# Patient Record
Sex: Male | Born: 1940 | Race: White | Hispanic: No | Marital: Married | Smoking: Former smoker
Health system: Southern US, Community
[De-identification: ages and names within clinical notes are randomized; demographics above are authoritative.]

## PROBLEM LIST (undated history)

## (undated) DIAGNOSIS — N200 Calculus of kidney: Secondary | ICD-10-CM

## (undated) DIAGNOSIS — R7881 Bacteremia: Secondary | ICD-10-CM

## (undated) DIAGNOSIS — J449 Chronic obstructive pulmonary disease, unspecified: Secondary | ICD-10-CM

## (undated) DIAGNOSIS — H269 Unspecified cataract: Secondary | ICD-10-CM

## (undated) DIAGNOSIS — F329 Major depressive disorder, single episode, unspecified: Secondary | ICD-10-CM

## (undated) DIAGNOSIS — F32A Depression, unspecified: Secondary | ICD-10-CM

## (undated) DIAGNOSIS — M359 Systemic involvement of connective tissue, unspecified: Secondary | ICD-10-CM

## (undated) DIAGNOSIS — C801 Malignant (primary) neoplasm, unspecified: Secondary | ICD-10-CM

## (undated) DIAGNOSIS — I2699 Other pulmonary embolism without acute cor pulmonale: Secondary | ICD-10-CM

## (undated) DIAGNOSIS — Z87442 Personal history of urinary calculi: Secondary | ICD-10-CM

## (undated) DIAGNOSIS — C3491 Malignant neoplasm of unspecified part of right bronchus or lung: Secondary | ICD-10-CM

## (undated) DIAGNOSIS — I4891 Unspecified atrial fibrillation: Secondary | ICD-10-CM

## (undated) HISTORY — PX: CYSTOSCOPY W/ URETEROSCOPY W/ LITHOTRIPSY: SUR380

## (undated) HISTORY — DX: Malignant neoplasm of unspecified part of right bronchus or lung: C34.91

## (undated) HISTORY — DX: Calculus of kidney: N20.0

## (undated) HISTORY — DX: Unspecified cataract: H26.9

## (undated) HISTORY — PX: TONSILLECTOMY: SUR1361

---

## 2012-04-14 DIAGNOSIS — N200 Calculus of kidney: Secondary | ICD-10-CM

## 2012-04-14 HISTORY — DX: Calculus of kidney: N20.0

## 2016-05-06 ENCOUNTER — Other Ambulatory Visit: Payer: Self-pay | Admitting: Rheumatology

## 2016-05-06 DIAGNOSIS — M25512 Pain in left shoulder: Principal | ICD-10-CM

## 2016-05-06 DIAGNOSIS — G8929 Other chronic pain: Secondary | ICD-10-CM

## 2016-05-07 ENCOUNTER — Other Ambulatory Visit: Payer: Self-pay | Admitting: Rheumatology

## 2016-05-07 DIAGNOSIS — R918 Other nonspecific abnormal finding of lung field: Secondary | ICD-10-CM

## 2016-05-08 ENCOUNTER — Ambulatory Visit
Admission: RE | Admit: 2016-05-08 | Discharge: 2016-05-08 | Disposition: A | Discharge status: Home / Self Care | Payer: Medicare Other | Source: Ambulatory Visit | Attending: Rheumatology | Admitting: Rheumatology

## 2016-05-08 DIAGNOSIS — R918 Other nonspecific abnormal finding of lung field: Secondary | ICD-10-CM | POA: Insufficient documentation

## 2016-05-08 DIAGNOSIS — R590 Localized enlarged lymph nodes: Secondary | ICD-10-CM | POA: Diagnosis not present

## 2016-05-08 DIAGNOSIS — I2510 Atherosclerotic heart disease of native coronary artery without angina pectoris: Secondary | ICD-10-CM | POA: Insufficient documentation

## 2016-05-08 DIAGNOSIS — I700 Atherosclerosis of aorta: Secondary | ICD-10-CM | POA: Diagnosis not present

## 2016-05-08 HISTORY — DX: Systemic involvement of connective tissue, unspecified: M35.9

## 2016-05-08 MED ORDER — IOPAMIDOL (ISOVUE-300) INJECTION 61%
75.0000 mL | Freq: Once | INTRAVENOUS | Status: AC | PRN
  Administered 2016-05-08: 75 mL via INTRAVENOUS

## 2016-05-09 ENCOUNTER — Other Ambulatory Visit: Payer: Self-pay

## 2016-05-14 DIAGNOSIS — C3491 Malignant neoplasm of unspecified part of right bronchus or lung: Secondary | ICD-10-CM | POA: Insufficient documentation

## 2016-05-14 HISTORY — DX: Malignant neoplasm of unspecified part of right bronchus or lung: C34.91

## 2016-05-14 NOTE — Progress Notes (Signed)
[place]  Telephone:(336) [phone] Fax:(336) [phone]  ID: GREYSIN MEDLEN OB: 02/12/1941  MR#: [id number]  BDZ#:[id number]  [name], MD as PCP - General (Family Medicine)  CHIEF COMPLAINT: Mass of upper lobe of left lung.  INTERVAL HISTORY: Patient is a 76 year old male who was noted to have persistent left shoulder pain and also had a 15-20 pound weight loss over the past several months. He otherwise has felt well. He has had no neurologic complaints. He denies any recent fevers. He denies any pain. He has no chest pain, shortness of breath, cough, or hemoptysis. He denies any nausea, vomiting, constipation, or diarrhea. He has no urinary complaints. Patient otherwise feels well and offers no further specific complaints.  REVIEW OF SYSTEMS:   Review of Systems  Constitutional: Positive for weight loss. Negative for fever and malaise/fatigue.  Respiratory: Negative.  Negative for cough, hemoptysis and shortness of breath.   Cardiovascular: Negative.  Negative for chest pain and leg swelling.  Gastrointestinal: Negative.  Negative for abdominal pain.  Genitourinary: Positive for frequency.  Musculoskeletal: Positive for joint pain.  Neurological: Negative.  Negative for sensory change and weakness.  Psychiatric/Behavioral: Negative.  The patient is not nervous/anxious.     As per HPI. Otherwise, a complete review of systems is negative.  PAST MEDICAL HISTORY: Past Medical History:  Diagnosis Date  . Cataract   . Collagen vascular disease ([place])   . Kidney stones 2014    PAST SURGICAL HISTORY: History reviewed. No pertinent surgical history.  FAMILY HISTORY: Family History  Problem Relation Age of Onset  . Heart disease Mother   . Heart disease Father   . Cancer Sister 6    luekemia    ADVANCED DIRECTIVES (Y/N):  N  HEALTH MAINTENANCE: Social History  Substance Use Topics  . Smoking status: Current Every Day Smoker   Packs/day: 2.00    Years: 55.00  . Smokeless tobacco: Never Used  . Alcohol use No     Comment: Not lately     Colonoscopy:  PAP:  Bone density:  Lipid panel:  No Known Allergies  Current Outpatient Prescriptions  Medication Sig Dispense Refill  . folic acid (FOLVITE) 1 MG tablet Take by mouth.    . methotrexate (RHEUMATREX) 2.5 MG tablet Take 20 mg by mouth.    . Naproxen Sodium 220 MG CAPS Take by mouth.    . tamsulosin (FLOMAX) 0.4 MG CAPS capsule Take 0.4 mg by mouth.    Marland Kitchen ibuprofen (ADVIL,MOTRIN) 200 MG tablet Take 200 mg by mouth.     No current facility-administered medications for this visit.     OBJECTIVE: Vitals:   05/15/16 1358  BP: 124/71  Pulse: 68  Temp: 98 F (36.7 C)     Body mass index is 25.15 kg/m.    ECOG FS:0 - Asymptomatic  General: Well-developed, well-nourished, no acute distress. Eyes: Pink conjunctiva, anicteric sclera. HEENT: Normocephalic, moist mucous membranes, clear oropharnyx. Lungs: Clear to auscultation bilaterally. Heart: Regular rate and rhythm. No rubs, murmurs, or gallops. Abdomen: Soft, nontender, nondistended. No organomegaly noted, normoactive bowel sounds. Musculoskeletal: No edema, cyanosis, or clubbing. Neuro: Alert, answering all questions appropriately. Cranial nerves grossly intact. Skin: No rashes or petechiae noted. Psych: Normal affect. Lymphatics: No cervical, calvicular, axillary or inguinal LAD.   LAB RESULTS:  No results found for: NA, K, CL, CO2, GLUCOSE, BUN, CREATININE, CALCIUM, PROT, ALBUMIN, AST, ALT, ALKPHOS, BILITOT, GFRNONAA, GFRAA  No results found for: WBC, NEUTROABS, HGB, HCT,  MCV, PLT   STUDIES: Ct Chest W Contrast  Result Date: 05/08/2016 CLINICAL DATA:  Lung mass. EXAM: CT CHEST WITH CONTRAST TECHNIQUE: Multidetector CT imaging of the chest was performed during intravenous contrast administration. CONTRAST:  56m ISOVUE-300 IOPAMIDOL (ISOVUE-300) INJECTION 61% COMPARISON:  Radiograph of  March 31, 2013. FINDINGS: Cardiovascular: Atherosclerosis of thoracic aorta is noted without aneurysm or dissection. Coronary artery calcifications are noted. Mediastinum/Nodes: 1.5 cm right peritracheal lymph node is noted. 1.8 cm right hilar lymph node is noted. Lungs/Pleura: 6.3 x 6.1 x 5.3 cm mass noted in left upper lobe consistent with malignancy. Just superior to it, 2.0 x 1.9 cm satellite lesion is noted. No pneumothorax or pleural effusion is noted. Right lung is clear. Upper Abdomen: No acute abnormality. Musculoskeletal: No chest wall abnormality. No acute or significant osseous findings. IMPRESSION: Aortic atherosclerosis. Coronary artery calcifications are noted suggesting coronary disease. 6.3 cm left upper lobe mass is noted consistent with malignancy. 2 cm satellite lesion is seen near it. Right peritracheal and hilar adenopathy is noted concerning for metastatic disease. These results will be called to the ordering clinician or representative by the Radiologist Assistant, and communication documented in the PACS or zVision Dashboard. Electronically Signed   By: JMarijo Conception M.D.   On: 05/08/2016 10:57    ASSESSMENT: Mass of upper lobe of left lung.  PLAN:    1. Mass of upper lobe of left lung: CT scan results reviewed independently and reported as above highly suspicious for underlying malignancy. Patient's case was also discussed at cancer conference with radiology and urology and it was agreed upon to pursue PET scan followed by pulmonary referral for bronchoscopic evaluation. Also, patient satellite lesion appears to be unchanged for several years based on chest x-rays and may be benign. Patient may require a second biopsy if necessary. Patient will return to clinic one week after his biopsy to discuss the results and treatment planning. Next  Approximately 45 minutes was spent in discussion of which greater than 50% was consultation.  Patient expressed understanding and was in  agreement with this plan. He also understands that He can call clinic at any time with any questions, concerns, or complaints.   Cancer Staging No matching staging information was found for the patient.  TLloyd Huger MD   05/18/2016 8:57 AM

## 2016-05-15 ENCOUNTER — Encounter: Payer: Self-pay | Admitting: Oncology

## 2016-05-15 ENCOUNTER — Ambulatory Visit: Payer: Self-pay

## 2016-05-15 ENCOUNTER — Inpatient Hospital Stay: Payer: Medicare Other | Attending: Oncology | Admitting: Oncology

## 2016-05-15 ENCOUNTER — Telehealth: Payer: Self-pay | Admitting: Pulmonary Disease

## 2016-05-15 DIAGNOSIS — Z806 Family history of leukemia: Secondary | ICD-10-CM | POA: Insufficient documentation

## 2016-05-15 DIAGNOSIS — Z79899 Other long term (current) drug therapy: Secondary | ICD-10-CM | POA: Diagnosis not present

## 2016-05-15 DIAGNOSIS — I700 Atherosclerosis of aorta: Secondary | ICD-10-CM | POA: Insufficient documentation

## 2016-05-15 DIAGNOSIS — R918 Other nonspecific abnormal finding of lung field: Secondary | ICD-10-CM

## 2016-05-15 DIAGNOSIS — I2510 Atherosclerotic heart disease of native coronary artery without angina pectoris: Secondary | ICD-10-CM | POA: Diagnosis not present

## 2016-05-15 DIAGNOSIS — F17210 Nicotine dependence, cigarettes, uncomplicated: Secondary | ICD-10-CM | POA: Diagnosis not present

## 2016-05-15 DIAGNOSIS — R634 Abnormal weight loss: Secondary | ICD-10-CM

## 2016-05-15 DIAGNOSIS — I998 Other disorder of circulatory system: Secondary | ICD-10-CM | POA: Diagnosis not present

## 2016-05-15 DIAGNOSIS — R590 Localized enlarged lymph nodes: Secondary | ICD-10-CM

## 2016-05-15 DIAGNOSIS — Z87442 Personal history of urinary calculi: Secondary | ICD-10-CM | POA: Diagnosis not present

## 2016-05-15 DIAGNOSIS — M25512 Pain in left shoulder: Secondary | ICD-10-CM | POA: Insufficient documentation

## 2016-05-15 NOTE — Telephone Encounter (Signed)
Please call patient as the [facility] w/ Dr. Grayland Ormond wants this patient in STAT?

## 2016-05-15 NOTE — Telephone Encounter (Signed)
Per DR set up a time next week on Tuesday or Thursday to be seen by him.

## 2016-05-15 NOTE — Progress Notes (Signed)
Patient here for initial visit. 55 pounds in the last year. No complaints of breathing, sleeping WNL, Urinary frequency at night, bowels normal.

## 2016-05-15 NOTE — Telephone Encounter (Signed)
Please look at CT on this pt and see how soon I need to schedule. Dr. Grayland Ormond wants pt in ASAP. Thanks

## 2016-05-16 NOTE — Telephone Encounter (Signed)
Received message from pt asking to call him back. Tried calling but no answer. LMOVM

## 2016-05-16 NOTE — Telephone Encounter (Signed)
LMOVM for pt to call me back to schedule appt.

## 2016-05-19 NOTE — Telephone Encounter (Signed)
Pt informed of appt. Nothing further needed.

## 2016-05-20 ENCOUNTER — Encounter
Admission: RE | Admit: 2016-05-20 | Discharge: 2016-05-20 | Disposition: A | Discharge status: Home / Self Care | Payer: Medicare Other | Source: Ambulatory Visit | Attending: Oncology | Admitting: Oncology

## 2016-05-20 DIAGNOSIS — R918 Other nonspecific abnormal finding of lung field: Secondary | ICD-10-CM | POA: Insufficient documentation

## 2016-05-20 LAB — GLUCOSE, CAPILLARY: Glucose-Capillary: 97 mg/dL (ref 65–99)

## 2016-05-20 MED ORDER — FLUDEOXYGLUCOSE F - 18 (FDG) INJECTION
12.7400 | Freq: Once | INTRAVENOUS | Status: AC | PRN
  Administered 2016-05-20: 12.74 via INTRAVENOUS

## 2016-05-21 NOTE — Progress Notes (Signed)
[facility] Consultation      Assessment and Plan:  Left lung mass.  --Appears accessible via LUL or lingular bronchus.  --Discussed risks and benefits of procedure, will proceed with EBUS.   Mediastinal and hilar lymphadenopathy.  --right sided lymphadenopathy, likely metastatic.   Nicotine abuse.  --Spent > 3 min in smoking cessation discussion.   Date: 05/21/2016  MRN# [id number] Frank Cowan May 24, 1940  Referring Physician:   KALDEN Cowan is a 76 y.o. old male seen in consultation for chief complaint of:    Chief Complaint  Patient presents with  . Advice Only    per Finnegan: cough w/yellow/brown mucus at times: no other symptoms    HPI:   Patient is a 76 year old male  Smoker who was noted to have persistent left shoulder pain and also had a 15-20 pound weight loss over the past several months. He had a CXR which noted a stable LUL nodule.  Review of Ct chest from 05/09/15 and PET scan from 05/21/15 show a 6 cm LUL mass with SUV of 21.3 which appears accessible from the bronchus, with adjacent 2 cm non PET avid lesion, which may be a satellite lesion or older benign.  There are also pre tracheal and right hilar lymph nodes with SUV of about 15.  He notes that his breathing is ok, he lives with his wife, he drives a car. 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 He has never been diagnosed with COPD/emphysema, but he did have a diagnosis of asthma. He does not have symptoms anymore, he thinks he was exposed to asbestos when he worked in [employer] fund[employer].   He is smoking about half ppd, but thinks he can quit.   He was ambulated on RA in office today, minimal dyspnea at good pace.   PMHX:   Past Medical History:  Diagnosis Date  . Cataract   . Collagen vascular disease ([place])   . Kidney stones 2014   Surgical Hx:  No past surgical history on file. Family Hx:  Family History  Problem Relation Age of Onset  . Heart  disease Mother   . Heart disease Father   . Cancer Sister 103    luekemia   Social Hx:   Social History  Substance Use Topics  . Smoking status: Current Every Day Smoker    Packs/day: 2.00    Years: 55.00  . Smokeless tobacco: Never Used  . Alcohol use No     Comment: Not lately   Medication:       Allergies:  Patient has no known allergies.  Review of Systems: Gen:  Denies  fever, sweats, chills HEENT: Denies blurred vision, double vision. bleeds, sore throat Cvc:  No dizziness, chest pain. Resp:   Denies cough or sputum production, shortness of breath Gi: Denies swallowing difficulty, stomach pain. Gu:  Denies bladder incontinence, burning urine Ext:   No Joint pain, stiffness. Skin: No skin rash,  hives  Endoc:  No polyuria, polydipsia. Psych: No depression, insomnia. Other:  All other systems were reviewed with the patient and were negative other that what is mentioned in the HPI.   Physical Examination:   VS: BP 110/68 (BP Location: Right Arm, Cuff Size: Normal)   Pulse 71   Wt 184 lb (83.5 kg)   SpO2 99%   BMI 24.95 kg/m   General Appearance: No distress  Neuro:without focal findings,  speech normal,  HEENT: PERRLA, EOM intact.  Pulmonary: normal breath sounds, No wheezing.  CardiovascularNormal S1,S2.  No m/r/g.   Abdomen: Benign, Soft, non-tender. Renal:  No costovertebral tenderness  GU:  No performed at this time. Endoc: No evident thyromegaly, no signs of acromegaly. Skin:   warm, no rashes, no ecchymosis  Extremities: normal, no cyanosis, clubbing.  Other findings:    LABORATORY PANEL:   CBC No results for input(s): WBC, HGB, HCT, PLT in the last 168 hours. ------------------------------------------------------------------------------------------------------------------  Chemistries  No results for input(s): NA, K, CL, CO2, GLUCOSE, BUN, CREATININE, CALCIUM, MG, AST, ALT, ALKPHOS, BILITOT in the last 168 hours.  Invalid input(s):  GFRCGP ------------------------------------------------------------------------------------------------------------------  Cardiac Enzymes No results for input(s): TROPONINI in the last 168 hours. ------------------------------------------------------------  RADIOLOGY:  Nm Pet Image Initial (pi) Skull Base To Thigh  Result Date: 05/20/2016 CLINICAL DATA:  Initial treatment strategy for left upper lobe mass. EXAM: NUCLEAR MEDICINE PET SKULL BASE TO THIGH TECHNIQUE: 12.7 mCi F-18 FDG was injected intravenously. Full-ring PET imaging was performed from the skull base to thigh after the radiotracer. CT data was obtained and used for attenuation correction and anatomic localization. FASTING BLOOD GLUCOSE:  Value: 97 mg/dl COMPARISON:  Chest CT 05/08/2016 FINDINGS: NECK No hypermetabolic lymph nodes in the neck. Mild glottic activity is likely physiologic. CHEST Dominant centrally necrotic 6.7 by 6.3 cm left upper lobe mass on image 90/3, maximum SUV 21.3. Pathologic been hypermetabolic right paratracheal, AP window, and right hilar adenopathy. Right lower paratracheal node 1.4 cm in short axis on image 86/3, maximum SUV 14.9. Right hilar node 2.0 cm in short axis on image 94/3, maximum SUV 14.5. The left upper lobe nodule with the thick calcification posteriorly is not hypermetabolic, and measures about 2.5 by 1.8 cm on image 74/3. Bilateral airway thickening. Coronary, aortic arch, and branch vessel atherosclerotic vascular disease. ABDOMEN/PELVIS Photopenic left-sided renal cysts. Sigmoid diverticulosis. Aortoiliac atherosclerotic vascular disease. Levoconvex rotary lumbar scoliosis. No findings of metastatic disease to the abdomen or pelvis. Mildly prominent prostate gland. SKELETON No focal hypermetabolic activity to suggest skeletal metastasis. IMPRESSION: 1. Highly hypermetabolic dominant 6.7 cm left upper lobe mass with hypermetabolic right hilar, right paratracheal, and AP window adenopathy. 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 2. The smaller left upper lobe nodule with posterior calcification seems benign, and is not hypermetabolic and could be a pulmonary hamartoma. 3. Other imaging findings of potential clinical significance: Coronary, aortic arch, and branch vessel atherosclerotic vascular disease. Aortoiliac atherosclerotic vascular disease. Airway thickening is present, suggesting bronchitis or reactive airways disease. Thoracolumbar scoliosis. Mildly prominent prostate gland. Electronically Signed   By: Van Clines M.D.   On: 05/20/2016 12:35       Thank  you for the consultation and for allowing [facility] to assist in the care of your patient. Our recommendations are noted above.  Please contact us if we can be of further service.   Marda Stalker, MD.  Board Certified in Internal Medicine, Pulmonary Medicine, [place], and Sleep Medicine.  [facility] Office Number: [phone]  Patricia Pesa, M.D.  Vilinda Boehringer, M.D.  Merton Border, M.D  05/21/2016

## 2016-05-22 ENCOUNTER — Encounter: Payer: Self-pay | Admitting: Internal Medicine

## 2016-05-22 ENCOUNTER — Ambulatory Visit (INDEPENDENT_AMBULATORY_CARE_PROVIDER_SITE_OTHER): Payer: Medicare Other | Admitting: Internal Medicine

## 2016-05-22 ENCOUNTER — Telehealth: Payer: Self-pay | Admitting: *Deleted

## 2016-05-22 VITALS — BP 110/68 | HR 71 | Wt 184.0 lb

## 2016-05-22 DIAGNOSIS — R590 Localized enlarged lymph nodes: Secondary | ICD-10-CM

## 2016-05-22 DIAGNOSIS — F17210 Nicotine dependence, cigarettes, uncomplicated: Secondary | ICD-10-CM | POA: Diagnosis not present

## 2016-05-22 DIAGNOSIS — R918 Other nonspecific abnormal finding of lung field: Secondary | ICD-10-CM

## 2016-05-22 NOTE — Patient Instructions (Addendum)
--  Will schedule EBUS Bronchoscopy  --You need to quit smoking before the bronchoscopy.   --Quitting smoking is the most important thing that you can do for your health.  --Quitting smoking will have greater affect on your health than any medicine that we can give you.

## 2016-05-22 NOTE — Telephone Encounter (Signed)
PET results reviewed with [name], pt's daughter. Informed [name] that the patient will be seeing Dr. Ashby Dawes this afternoon to discuss obtaining biopsy of left upper lobe lung mass. Once biopsy is scheduled then pt will follow up with Dr. Grayland Ormond 1 week later to review results and discuss treatment planning. Our office will notify pt once scheduled. [name] verbalized understanding.

## 2016-06-06 ENCOUNTER — Encounter
Admission: RE | Admit: 2016-06-06 | Discharge: 2016-06-06 | Disposition: A | Discharge status: Home / Self Care | Payer: Medicare Other | Source: Ambulatory Visit | Attending: Internal Medicine | Admitting: Internal Medicine

## 2016-06-06 DIAGNOSIS — R001 Bradycardia, unspecified: Secondary | ICD-10-CM | POA: Diagnosis not present

## 2016-06-06 DIAGNOSIS — Z01818 Encounter for other preprocedural examination: Secondary | ICD-10-CM | POA: Diagnosis not present

## 2016-06-06 DIAGNOSIS — Z01810 Encounter for preprocedural cardiovascular examination: Secondary | ICD-10-CM | POA: Insufficient documentation

## 2016-06-06 HISTORY — DX: Personal history of urinary calculi: Z87.442

## 2016-06-06 LAB — CBC
HCT: 36 % — ABNORMAL LOW (ref 40.0–52.0)
Hemoglobin: 12.4 g/dL — ABNORMAL LOW (ref 13.0–18.0)
MCH: 31.9 pg (ref 26.0–34.0)
MCHC: 34.5 g/dL (ref 32.0–36.0)
MCV: 92.3 fL (ref 80.0–100.0)
PLATELETS: 164 10*3/uL (ref 150–440)
RBC: 3.89 MIL/uL — ABNORMAL LOW (ref 4.40–5.90)
RDW: 13 % (ref 11.5–14.5)
WBC: 6.3 10*3/uL (ref 3.8–10.6)

## 2016-06-06 NOTE — Patient Instructions (Signed)
Your procedure is scheduled on: Thursday 06/12/16 Report to [facility]. 2ND FLOOR MEDICAL MALL ENTRANCE. To find out your arrival time please call [phone] between 1PM - 3PM on Wednesday 06/11/16.  Remember: Instructions that are not followed completely may result in serious medical risk, up to and including death, or upon the discretion of your surgeon and anesthesiologist your surgery may need to be rescheduled.    __X__ 1. Do not eat food or drink liquids after midnight. No gum chewing or hard candies.     __X__ 2. No Alcohol for 24 hours before or after surgery.   ____ 3. Bring all medications with you on the day of surgery if instructed.    __X__ 4. Notify your doctor if there is any change in your medical condition     (cold, fever, infections).             ___X__5. No smoking within 24 hours of your surgery.     Do not wear jewelry, make-up, hairpins, clips or nail polish.  Do not wear lotions, powders, or perfumes.   Do not shave 48 hours prior to surgery. Men may shave face and neck.  Do not bring valuables to the hospital.    [facility] is not responsible for any belongings or valuables.               Contacts, dentures or bridgework may not be worn into surgery.  Leave your suitcase in the car. After surgery it may be brought to your room.  For patients admitted to the hospital, discharge time is determined by your                treatment team.   Patients discharged the day of surgery will not be allowed to drive home.   Please read over the following fact sheets that you were given:   MRSA Information   __X__ Take these medicines the morning of surgery with A SIP OF WATER:    1. TAMSULOSIN  2.   3.   4.  5.  6.  ____ Fleet Enema (as directed)   ____ Use CHG Soap as directed  ____ Use inhalers on the day of surgery  ____ Stop metformin 2 days prior to surgery    ____ Take 1/2 of usual insulin dose the night before surgery and none on the morning of  surgery.   ____ Stop Coumadin/Plavix/aspirin on   __X__ Stop Anti-inflammatories such as Advil, Aleve, Ibuprofen, Motrin, Naproxen, Naprosyn, Goodies,powder, or aspirin products.  OK to take Tylenol.   ____ Stop supplements until after surgery.    ____ Bring C-Pap to the hospital.

## 2016-06-07 NOTE — Pre-Procedure Instructions (Signed)
CBC sent to Dr. Ashby Dawes and Anesthesia for review.

## 2016-06-12 ENCOUNTER — Ambulatory Visit
Admission: RE | Admit: 2016-06-12 | Discharge: 2016-06-12 | Disposition: A | Discharge status: Home / Self Care | Payer: Medicare Other | Source: Ambulatory Visit | Attending: Internal Medicine | Admitting: Internal Medicine

## 2016-06-12 ENCOUNTER — Ambulatory Visit: Payer: Medicare Other

## 2016-06-12 ENCOUNTER — Ambulatory Visit: Payer: Medicare Other | Admitting: Anesthesiology

## 2016-06-12 ENCOUNTER — Encounter
Admission: RE | Disposition: A | Discharge status: Home / Self Care | Payer: Self-pay | Source: Ambulatory Visit | Attending: Internal Medicine

## 2016-06-12 ENCOUNTER — Encounter: Payer: Self-pay | Admitting: Anesthesiology

## 2016-06-12 DIAGNOSIS — R918 Other nonspecific abnormal finding of lung field: Secondary | ICD-10-CM

## 2016-06-12 DIAGNOSIS — F17210 Nicotine dependence, cigarettes, uncomplicated: Secondary | ICD-10-CM | POA: Diagnosis not present

## 2016-06-12 DIAGNOSIS — Z9889 Other specified postprocedural states: Secondary | ICD-10-CM

## 2016-06-12 DIAGNOSIS — Z87442 Personal history of urinary calculi: Secondary | ICD-10-CM | POA: Diagnosis not present

## 2016-06-12 DIAGNOSIS — J42 Unspecified chronic bronchitis: Secondary | ICD-10-CM | POA: Insufficient documentation

## 2016-06-12 DIAGNOSIS — R590 Localized enlarged lymph nodes: Secondary | ICD-10-CM | POA: Insufficient documentation

## 2016-06-12 HISTORY — PX: ENDOBRONCHIAL ULTRASOUND: SHX5096

## 2016-06-12 LAB — BODY FLUID CELL COUNT WITH DIFFERENTIAL
Eos, Fluid: 2 %
LYMPHS FL: 17 %
MONOCYTE-MACROPHAGE-SEROUS FLUID: 2 %
NEUTROPHIL FLUID: 79 %
Other Cells, Fluid: 0 %
Total Nucleated Cell Count, Fluid: 171 cu mm

## 2016-06-12 SURGERY — ENDOBRONCHIAL ULTRASOUND (EBUS)
Anesthesia: General

## 2016-06-12 MED ORDER — FAMOTIDINE 20 MG PO TABS
20.0000 mg | ORAL_TABLET | Freq: Once | ORAL | Status: AC
  Administered 2016-06-12: 20 mg via ORAL

## 2016-06-12 MED ORDER — FENTANYL CITRATE (PF) 100 MCG/2ML IJ SOLN
INTRAMUSCULAR | Status: AC
  Filled 2016-06-12: qty 2

## 2016-06-12 MED ORDER — LIDOCAINE HCL (CARDIAC) 20 MG/ML IV SOLN
INTRAVENOUS | Status: DC | PRN
  Administered 2016-06-12: 30 mg via INTRAVENOUS

## 2016-06-12 MED ORDER — SUGAMMADEX SODIUM 200 MG/2ML IV SOLN
INTRAVENOUS | Status: DC | PRN
  Administered 2016-06-12: 160 mg via INTRAVENOUS

## 2016-06-12 MED ORDER — FENTANYL CITRATE (PF) 100 MCG/2ML IJ SOLN: 25.0000 ug | INTRAMUSCULAR | Status: DC | PRN

## 2016-06-12 MED ORDER — PROPOFOL 10 MG/ML IV BOLUS
INTRAVENOUS | Status: DC | PRN
  Administered 2016-06-12: 100 mg via INTRAVENOUS

## 2016-06-12 MED ORDER — PHENYLEPHRINE 40 MCG/ML (10ML) SYRINGE FOR IV PUSH (FOR BLOOD PRESSURE SUPPORT)
PREFILLED_SYRINGE | INTRAVENOUS | Status: AC
  Filled 2016-06-12: qty 10

## 2016-06-12 MED ORDER — FENTANYL CITRATE (PF) 100 MCG/2ML IJ SOLN
INTRAMUSCULAR | Status: DC | PRN
  Administered 2016-06-12: 50 ug via INTRAVENOUS

## 2016-06-12 MED ORDER — SUCCINYLCHOLINE CHLORIDE 20 MG/ML IJ SOLN
INTRAMUSCULAR | Status: DC | PRN
  Administered 2016-06-12: 100 mg via INTRAVENOUS

## 2016-06-12 MED ORDER — DEXAMETHASONE SODIUM PHOSPHATE 10 MG/ML IJ SOLN
INTRAMUSCULAR | Status: AC
  Filled 2016-06-12: qty 1

## 2016-06-12 MED ORDER — LACTATED RINGERS IV SOLN
INTRAVENOUS | Status: DC
  Administered 2016-06-12: 13:00:00 via INTRAVENOUS

## 2016-06-12 MED ORDER — ROCURONIUM BROMIDE 50 MG/5ML IV SOSY
PREFILLED_SYRINGE | INTRAVENOUS | Status: AC
  Filled 2016-06-12: qty 5

## 2016-06-12 MED ORDER — DEXAMETHASONE SODIUM PHOSPHATE 10 MG/ML IJ SOLN
INTRAMUSCULAR | Status: DC | PRN
  Administered 2016-06-12: 5 mg via INTRAVENOUS

## 2016-06-12 MED ORDER — PROPOFOL 10 MG/ML IV BOLUS
INTRAVENOUS | Status: AC
  Filled 2016-06-12: qty 20

## 2016-06-12 MED ORDER — ONDANSETRON HCL 4 MG/2ML IJ SOLN: 4.0000 mg | Freq: Once | INTRAMUSCULAR | Status: DC | PRN

## 2016-06-12 MED ORDER — ONDANSETRON HCL 4 MG/2ML IJ SOLN
INTRAMUSCULAR | Status: DC | PRN
  Administered 2016-06-12: 4 mg via INTRAVENOUS

## 2016-06-12 MED ORDER — SUGAMMADEX SODIUM 200 MG/2ML IV SOLN
INTRAVENOUS | Status: AC
  Filled 2016-06-12: qty 2

## 2016-06-12 MED ORDER — ONDANSETRON HCL 4 MG/2ML IJ SOLN
INTRAMUSCULAR | Status: AC
  Filled 2016-06-12: qty 2

## 2016-06-12 MED ORDER — ROCURONIUM BROMIDE 100 MG/10ML IV SOLN
INTRAVENOUS | Status: DC | PRN
  Administered 2016-06-12: 10 mg via INTRAVENOUS
  Administered 2016-06-12: 20 mg via INTRAVENOUS
  Administered 2016-06-12: 10 mg via INTRAVENOUS

## 2016-06-12 MED ORDER — FAMOTIDINE 20 MG PO TABS
ORAL_TABLET | ORAL | Status: AC
  Administered 2016-06-12: 20 mg via ORAL
  Filled 2016-06-12: qty 1

## 2016-06-12 NOTE — H&P (View-Only) (Signed)
[facility] Consultation      Assessment and Plan:  Left lung mass.  --Appears accessible via LUL or lingular bronchus.  --Discussed risks and benefits of procedure, will proceed with EBUS.   Mediastinal and hilar lymphadenopathy.  --right sided lymphadenopathy, likely metastatic.   Nicotine abuse.  --Spent > 3 min in smoking cessation discussion.   Date: 05/21/2016  MRN# [id number] Frank Cowan 07-08-1940  Referring Physician:   DABID GODOWN is a 76 y.o. old male seen in consultation for chief complaint of:    Chief Complaint  Patient presents with  . Advice Only    per Finnegan: cough w/yellow/brown mucus at times: no other symptoms    HPI:   Patient is a 76 year old male  Smoker who was noted to have persistent left shoulder pain and also had a 15-20 pound weight loss over the past several months. He had a CXR which noted a stable LUL nodule.  Review of Ct chest from 05/09/15 and PET scan from 05/21/15 show a 6 cm LUL mass with SUV of 21.3 which appears accessible from the bronchus, with adjacent 2 cm non PET avid lesion, which may be a satellite lesion or older benign.  There are also pre tracheal and right hilar lymph nodes with SUV of about 15.  He notes that his breathing is ok, he lives with his wife, he drives a car. 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 He has never been diagnosed with COPD/emphysema, but he did have a diagnosis of asthma. He does not have symptoms anymore, he thinks he was exposed to asbestos when he worked in [employer] fund[employer].   He is smoking about half ppd, but thinks he can quit.   He was ambulated on RA in office today, minimal dyspnea at good pace.   PMHX:   Past Medical History:  Diagnosis Date  . Cataract   . Collagen vascular disease ([place])   . Kidney stones 2014   Surgical Hx:  No past surgical history on file. Family Hx:  Family History  Problem Relation Age of Onset  . Heart  disease Mother   . Heart disease Father   . Cancer Sister 83    luekemia   Social Hx:   Social History  Substance Use Topics  . Smoking status: Current Every Day Smoker    Packs/day: 2.00    Years: 55.00  . Smokeless tobacco: Never Used  . Alcohol use No     Comment: Not lately   Medication:       Allergies:  Patient has no known allergies.  Review of Systems: Gen:  Denies  fever, sweats, chills HEENT: Denies blurred vision, double vision. bleeds, sore throat Cvc:  No dizziness, chest pain. Resp:   Denies cough or sputum production, shortness of breath Gi: Denies swallowing difficulty, stomach pain. Gu:  Denies bladder incontinence, burning urine Ext:   No Joint pain, stiffness. Skin: No skin rash,  hives  Endoc:  No polyuria, polydipsia. Psych: No depression, insomnia. Other:  All other systems were reviewed with the patient and were negative other that what is mentioned in the HPI.   Physical Examination:   VS: BP 110/68 (BP Location: Right Arm, Cuff Size: Normal)   Pulse 71   Wt 184 lb (83.5 kg)   SpO2 99%   BMI 24.95 kg/m   General Appearance: No distress  Neuro:without focal findings,  speech normal,  HEENT: PERRLA, EOM intact.  Pulmonary: normal breath sounds, No wheezing.  CardiovascularNormal S1,S2.  No m/r/g.   Abdomen: Benign, Soft, non-tender. Renal:  No costovertebral tenderness  GU:  No performed at this time. Endoc: No evident thyromegaly, no signs of acromegaly. Skin:   warm, no rashes, no ecchymosis  Extremities: normal, no cyanosis, clubbing.  Other findings:    LABORATORY PANEL:   CBC No results for input(s): WBC, HGB, HCT, PLT in the last 168 hours. ------------------------------------------------------------------------------------------------------------------  Chemistries  No results for input(s): NA, K, CL, CO2, GLUCOSE, BUN, CREATININE, CALCIUM, MG, AST, ALT, ALKPHOS, BILITOT in the last 168 hours.  Invalid input(s):  GFRCGP ------------------------------------------------------------------------------------------------------------------  Cardiac Enzymes No results for input(s): TROPONINI in the last 168 hours. ------------------------------------------------------------  RADIOLOGY:  Nm Pet Image Initial (pi) Skull Base To Thigh  Result Date: 05/20/2016 CLINICAL DATA:  Initial treatment strategy for left upper lobe mass. EXAM: NUCLEAR MEDICINE PET SKULL BASE TO THIGH TECHNIQUE: 12.7 mCi F-18 FDG was injected intravenously. Full-ring PET imaging was performed from the skull base to thigh after the radiotracer. CT data was obtained and used for attenuation correction and anatomic localization. FASTING BLOOD GLUCOSE:  Value: 97 mg/dl COMPARISON:  Chest CT 05/08/2016 FINDINGS: NECK No hypermetabolic lymph nodes in the neck. Mild glottic activity is likely physiologic. CHEST Dominant centrally necrotic 6.7 by 6.3 cm left upper lobe mass on image 90/3, maximum SUV 21.3. Pathologic been hypermetabolic right paratracheal, AP window, and right hilar adenopathy. Right lower paratracheal node 1.4 cm in short axis on image 86/3, maximum SUV 14.9. Right hilar node 2.0 cm in short axis on image 94/3, maximum SUV 14.5. The left upper lobe nodule with the thick calcification posteriorly is not hypermetabolic, and measures about 2.5 by 1.8 cm on image 74/3. Bilateral airway thickening. Coronary, aortic arch, and branch vessel atherosclerotic vascular disease. ABDOMEN/PELVIS Photopenic left-sided renal cysts. Sigmoid diverticulosis. Aortoiliac atherosclerotic vascular disease. Levoconvex rotary lumbar scoliosis. No findings of metastatic disease to the abdomen or pelvis. Mildly prominent prostate gland. SKELETON No focal hypermetabolic activity to suggest skeletal metastasis. IMPRESSION: 1. Highly hypermetabolic dominant 6.7 cm left upper lobe mass with hypermetabolic right hilar, right paratracheal, and AP window adenopathy. 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 2. The smaller left upper lobe nodule with posterior calcification seems benign, and is not hypermetabolic and could be a pulmonary hamartoma. 3. Other imaging findings of potential clinical significance: Coronary, aortic arch, and branch vessel atherosclerotic vascular disease. Aortoiliac atherosclerotic vascular disease. Airway thickening is present, suggesting bronchitis or reactive airways disease. Thoracolumbar scoliosis. Mildly prominent prostate gland. Electronically Signed   By: Van Clines M.D.   On: 05/20/2016 12:35       Thank  you for the consultation and for allowing [facility] to assist in the care of your patient. Our recommendations are noted above.  Please contact us if we can be of further service.   Marda Stalker, MD.  Board Certified in Internal Medicine, Pulmonary Medicine, [place], and Sleep Medicine.  [facility] Office Number: [phone]  Patricia Pesa, M.D.  Vilinda Boehringer, M.D.  Merton Border, M.D  05/21/2016

## 2016-06-12 NOTE — Discharge Instructions (Addendum)
AMBULATORY SURGERY  DISCHARGE INSTRUCTIONS   1) The drugs that you were given will stay in your system until tomorrow so for the next 24 hours you should not:  A) Drive an automobile B) Make any legal decisions C) Drink any alcoholic beverage   2) You may resume regular meals tomorrow.  Today it is better to start with liquids and gradually work up to solid foods.  You may eat anything you prefer, but it is better to start with liquids, then soup and crackers, and gradually work up to solid foods.   3) Please notify your doctor immediately if you have any unusual bleeding, trouble breathing, redness and pain at the surgery site, drainage, fever, or pain not relieved by medication. 4)   5) Your post-operative visit with Dr.                                     is: Date:                        Time:    Please call to schedule your post-operative visit.  6) Additional Instructions:     Needle Biopsy of the Lung, Care After  You have a small pneumothorax (lung collapse) which is small, approximately 5% of the lung or less. This  does not require treatment.  A small pneumothorax may go away on its own without treatment, but needs to be observed to make sure it is going away. You will need to repeat a chest x ray on the day after your procedure. Extra oxygen can sometimes help a small pneumothorax go away more quickly. For a larger pneumothorax or a pneumothorax that is causing symptoms, a procedure is usually needed to drain the air.Follow these instructions at home:  Only take over-the-counter or prescription medicines as directed by your health care provider.  If a cough or pain makes it difficult for you to sleep at night, try sleeping in a semi-upright position in a recliner or by using 2 or 3 pillows.  Rest and limit activity as directed by your health care provider.  Do not smoke. Smoking may make it worse.   Do not fly in an airplane or scuba dive until your health care  provider says it is okay.  Follow up with your health care provider as directed. Get help right away (call 911) if:  You have increasing chest pain or have trouble breathing.   You have pain that is getting worse or is not controlled with medicines.  This sheet gives you information about how to care for yourself after your procedure. Your health care provider may also give you more specific instructions. If you have problems or questions, contact your health care provider. What can I expect after the procedure? After the procedure, it is common to have:  Soreness, pain, and tenderness where a tissue sample was taken in the left lung.   A cough.  A sore throat. Follow these instructions at home:   Contact a health care provider if:  You have more worsening chest pain.   Fever or coughing up blood that does not respond to naproxen or tylenol or does not improve after 24 hours.  Get help right away if:  You have problems breathing.  You have chest pain.  You cough up blood for more than 24 hours.   You faint.  You  have a fast heart rate. Summary  After a needle biopsy of the lung, it is common to have a cough, a sore throat, or soreness, pain, and tenderness where a tissue sample was taken (left lung).  Document Released: 01/26/2007 Document Revised: 02/20/2016 Document Reviewed: 02/20/2016 Elsevier Interactive Patient Education  2017 [employer].

## 2016-06-12 NOTE — Procedures (Signed)
  [facility]            Bronchoscopy Note   FINDINGS/SUMMARY:   -Normal airways with moderate mucosal secretions which were easily suctioned, enlarged mucous glands consistent with chronic bronchitis. -EBUS guided lymph node biopsy taken of right paratracheal lymph node, minimal lymphadenopathy was seen in the right hilar area and was not sampled. -Transbronchial left upper lobe. Biopsies taken via fluoroscopic guidance with forceps. -Transbronchial Cytobrush taken under fluoroscopic guidance. -Bronchoalveolar lavage taken of the left upper lobe, sent for cytology and microbiology. -Postoperative chest x-ray was reviewed that shows a tiny left apical pneumothorax, patient was otherwise stable and did not require chest tube drainage. Follow-up is arranged for postoperative repeat two-view chest x-ray tomorrow.  Indication: Lung mass.  The patient (or their representative) was informed of the risks (including but not limited to bleeding, infection, respiratory failure, lung injury, tooth/oral injury) and benefits of the procedure and gave consent, see chart.   Pre-op diagnosis: Lung mass Post-op diagnosis: Same Tiny left apical pneumothorax Estimated blood loss: Minimal  Medications for procedure: See anesthesia note  Procedure description: After obtaining informed consent, a timeout was called to confirm the patient and the procedure. The patient was intubated by anesthesia services. Please see their note for further details. The EBUS Bronk scope was introduced via the endotracheal tube, the scope was taken to the right hilar lymph node station, however, minimal lymphadenopathy could be identified in this area that was accessible via EBUS biopsy. Therefore, the bronchoscope was taken to the right paratracheal area, 4R. Here. 3 passes were made with good returns. Subsequently, the EBUS bronchoscope was removed, and the white light bronchoscope was advanced via the  endotracheal tube. An anatomical tour was undertaken, all segments were visualized. There was moderate to severe secretions throughout both lungs which were white, and easily suctioned. There were enlarged. Numerous mucous glands throughout both lungs. Bronk scope was then taken to the left upper lobe, the forceps was passed under fluoroscopic guidance via the lingula. However, this did not appear to correspond with the mass that was seen on fluoroscopy. Therefore, past were made from the left upper lobe, this did apparently to the mass. No endobronchial lesions were noted. No significant narrowing at the bronchi openings were noted. Several passes were taken with the forceps biopsy under fluoroscopic guidance. After adequate samples were obtained, the cytology brush was passed. Similarly under fluoroscopic guidance. Subsequently BAL was performed with instillation of saline, returns were sent for cytology and microbiology. Final fluoroscopic view was taken of the left apex which suggested this tiny left pneumothorax, subsequently did call for a stat portable chest x-ray. This confirmed a small tiny left apical pneumothorax. The patient was subsequently extubated and brought to the postoperative area. Here, he was recovering well, he was weaned to oxygen with oxygen saturation 100%, subsequent examination revealed no decreased air entry in the left lung and it was decided that this could be safely observed with repeat chest x-ray tomorrow.    Condition post procedure: stable.    Complications: Small left apical pneumothorax.   Patient instructions:  Repeat CXR 2 view tomorrow.    Marda Stalker, MD.  Board Certified in Internal Medicine, Pulmonary Medicine, [place], and Sleep Medicine.  [facility] Office Number: [phone]  Patricia Pesa, M.D.  Vilinda Boehringer, M.D.  Cheral Marker, M.D  06/12/2016

## 2016-06-12 NOTE — Transfer of Care (Signed)
Immediate Anesthesia Transfer of Care Note  Patient: Frank Cowan  Procedure(s) Performed: Procedure(s): ENDOBRONCHIAL ULTRASOUND (N/A)  Patient Location: PACU  Anesthesia Type:General  Level of Consciousness: awake and patient cooperative  Airway & Oxygen Therapy: Patient Spontanous Breathing and Patient connected to face mask oxygen  Post-op Assessment: Report given to RN and Post -op Vital signs reviewed and stable  Post vital signs: Reviewed and stable  Last Vitals:  Vitals:   06/12/16 1203 06/12/16 1422  BP: 114/71 (!) (P) 92/59  Pulse: 63 58  Resp: 16 18  Temp: 36.6 C (P) 36.3 C    Last Pain:  Vitals:   06/12/16 1203  TempSrc: Tympanic  PainSc: 5          Complications: No apparent anesthesia complications

## 2016-06-12 NOTE — Anesthesia Procedure Notes (Signed)
Procedure Name: Intubation Date/Time: 06/12/2016 12:58 PM Performed by: Dionne Bucy Pre-anesthesia Checklist: Patient identified, Patient being monitored, Timeout performed, Emergency Drugs available and Suction available Patient Re-evaluated:Patient Re-evaluated prior to inductionOxygen Delivery Method: Circle system utilized Preoxygenation: Pre-oxygenation with 100% oxygen Intubation Type: IV induction Ventilation: Mask ventilation without difficulty Laryngoscope Size: Mac and 4 Grade View: Grade I Tube type: Oral Tube size: 8.0 mm Number of attempts: 1 Airway Equipment and Method: Stylet Placement Confirmation: ETT inserted through vocal cords under direct vision,  positive ETCO2 and breath sounds checked- equal and bilateral Secured at: 23 cm Tube secured with: Tape Dental Injury: Teeth and Oropharynx as per pre-operative assessment

## 2016-06-12 NOTE — Anesthesia Postprocedure Evaluation (Signed)
Anesthesia Post Note  Patient: Frank Cowan  Procedure(s) Performed: Procedure(s) (LRB): ENDOBRONCHIAL ULTRASOUND (N/A)  Patient location during evaluation: PACU Anesthesia Type: General Level of consciousness: awake and alert Pain management: pain level controlled Vital Signs Assessment: post-procedure vital signs reviewed and stable Respiratory status: spontaneous breathing, nonlabored ventilation, respiratory function stable and patient connected to nasal cannula oxygen Cardiovascular status: blood pressure returned to baseline and stable Postop Assessment: no signs of nausea or vomiting Anesthetic complications: no     Last Vitals:  Vitals:   06/12/16 1452 06/12/16 1507  BP: (!) 105/59 (!) 94/53  Pulse: (!) 58 (!) 57  Resp: (!) 23 17  Temp:  36.9 C    Last Pain:  Vitals:   06/12/16 1507  TempSrc:   PainSc: 0-No pain                 Precious Haws Mireya Meditz

## 2016-06-12 NOTE — Anesthesia Preprocedure Evaluation (Addendum)
Anesthesia Evaluation  Patient identified by MRN, date of birth, ID band Patient awake    Reviewed: Allergy & Precautions, NPO status , Patient's Chart, lab work & pertinent test results  Airway Mallampati: II  TM Distance: >3 FB     Dental  (+) Upper Dentures, Lower Dentures   Pulmonary Current Smoker,  Mass L upper lobe   Pulmonary exam normal        Cardiovascular negative cardio ROS Normal cardiovascular exam     Neuro/Psych negative neurological ROS     GI/Hepatic negative GI ROS, Neg liver ROS,   Endo/Other  negative endocrine ROS  Renal/GU stones  negative genitourinary   Musculoskeletal   Abdominal Normal abdominal exam  (+)   Peds negative pediatric ROS (+)  Hematology negative hematology ROS (+)   Anesthesia Other Findings Past Medical History: No date: Cataract No date: Collagen vascular disease (HCC) No date: History of kidney stones 2014: Kidney stones  Reproductive/Obstetrics                           Anesthesia Physical Anesthesia Plan  ASA: III  Anesthesia Plan: General   Post-op Pain Management:    Induction: Intravenous  Airway Management Planned: Oral ETT  Additional Equipment:   Intra-op Plan:   Post-operative Plan: Extubation in OR  Informed Consent: I have reviewed the patients History and Physical, chart, labs and discussed the procedure including the risks, benefits and alternatives for the proposed anesthesia with the patient or authorized representative who has indicated his/her understanding and acceptance.   Dental advisory given  Plan Discussed with: CRNA and Surgeon  Anesthesia Plan Comments:         Anesthesia Quick Evaluation

## 2016-06-12 NOTE — Interval H&P Note (Signed)
History and Physical Interval Note:  06/12/2016 12:17 PM  Frank Cowan  has presented today for surgery, with the diagnosis of lung mass  The various methods of treatment have been discussed with the patient and family. After consideration of risks, benefits and other options for treatment, the patient has consented to  Procedure(s): ENDOBRONCHIAL ULTRASOUND (N/A) as a surgical intervention .  The patient's history has been reviewed, patient examined, no change in status, stable for surgery.  I have reviewed the patient's chart and labs.  Questions were answered to the patient's satisfaction.     Laverle Hobby

## 2016-06-12 NOTE — Anesthesia Post-op Follow-up Note (Cosign Needed)
Anesthesia QCDR form completed.        

## 2016-06-13 ENCOUNTER — Encounter: Payer: Self-pay | Admitting: Internal Medicine

## 2016-06-13 ENCOUNTER — Ambulatory Visit
Admission: RE | Admit: 2016-06-13 | Discharge: 2016-06-13 | Disposition: A | Discharge status: Home / Self Care | Payer: Medicare Other | Source: Ambulatory Visit | Attending: Internal Medicine | Admitting: Internal Medicine

## 2016-06-13 ENCOUNTER — Telehealth: Payer: Self-pay | Admitting: *Deleted

## 2016-06-13 DIAGNOSIS — Z9889 Other specified postprocedural states: Secondary | ICD-10-CM | POA: Diagnosis not present

## 2016-06-13 DIAGNOSIS — J984 Other disorders of lung: Secondary | ICD-10-CM | POA: Insufficient documentation

## 2016-06-13 DIAGNOSIS — R918 Other nonspecific abnormal finding of lung field: Secondary | ICD-10-CM | POA: Insufficient documentation

## 2016-06-13 DIAGNOSIS — J939 Pneumothorax, unspecified: Secondary | ICD-10-CM | POA: Diagnosis not present

## 2016-06-13 NOTE — Telephone Encounter (Signed)
-----   Message from Laverle Hobby, MD sent at 06/13/2016  3:33 PM EST ----- Regarding: lung collapse.  Pls inform pt that lung collapse is almost completely resolved, there are no results on the bronchoscopy yet. If he has any symptoms of difficulty breathing they can call for another CXR otherwise there is no need to repeat the CXR.

## 2016-06-13 NOTE — Telephone Encounter (Signed)
Pt informed. Nothing further needed. 

## 2016-06-14 LAB — ACID FAST SMEAR (AFB): ACID FAST SMEAR - AFSCU2: NEGATIVE

## 2016-06-15 LAB — CULTURE, BAL-QUANTITATIVE

## 2016-06-15 LAB — CULTURE, BAL-QUANTITATIVE W GRAM STAIN: Culture: 1000 — AB

## 2016-06-18 NOTE — Progress Notes (Signed)
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  INTERVAL HISTORY: Patient returns to clinic today for further evaluation, discussion of his imaging and pathology results, and treatment planning. He continues to have left shoulder pain. He otherwise feels well. He has no neurologic complaints. He denies any recent fevers. He denies any other pain. He has no chest pain, shortness of breath, cough, or hemoptysis. He denies any nausea, vomiting, constipation, or diarrhea. He has no urinary complaints. Patient offers no further specific complaints today.  REVIEW OF SYSTEMS:   Review of Systems  Constitutional: Positive for weight loss. Negative for fever and malaise/fatigue.  Respiratory: Negative.  Negative for cough, hemoptysis and shortness of breath.   Cardiovascular: Negative.  Negative for chest pain and leg swelling.  Gastrointestinal: Negative.  Negative for abdominal pain.  Genitourinary: Negative.  Negative for frequency.  Musculoskeletal: Positive for joint pain.  Neurological: Negative.  Negative for sensory change and weakness.  Psychiatric/Behavioral: Negative.  The patient is not nervous/anxious.     As per HPI. Otherwise, a complete review of systems is negative.  PAST MEDICAL HISTORY: Past Medical History:  Diagnosis Date  . Cataract   . Collagen vascular disease ([place])   . History of kidney stones   . Kidney stones 2014    PAST SURGICAL HISTORY: Past Surgical History:  Procedure Laterality Date  . CYSTOSCOPY W/ URETEROSCOPY W/ LITHOTRIPSY    . ENDOBRONCHIAL ULTRASOUND N/A 06/12/2016   Procedure: ENDOBRONCHIAL ULTRASOUND;  Surgeon: Laverle Hobby, MD;  Location: [facility] ORS;  Service: Pulmonary;   Laterality: N/A;  . TONSILLECTOMY      FAMILY HISTORY: Family History  Problem Relation Age of Onset  . Heart disease Mother   . Heart disease Father   . Cancer Sister 6    luekemia    ADVANCED DIRECTIVES (Y/N):  N  HEALTH MAINTENANCE: Social History  Substance Use Topics  . Smoking status: Current Every Day Smoker    Packs/day: 0.50    Years: 55.00  . Smokeless tobacco: Former Systems developer  . Alcohol use No     Comment: Not lately     Colonoscopy:  PAP:  Bone density:  Lipid panel:  No Known Allergies  Current Outpatient Prescriptions  Medication Sig Dispense Refill  . acetaminophen (TYLENOL) 500 MG tablet Take 500 mg by mouth every 8 (eight) hours as needed for mild pain or headache. On hold for procedure    . folic acid (FOLVITE) 1 MG tablet Take 1 mg by mouth daily.     . methotrexate (RHEUMATREX) 2.5 MG tablet Take 20 mg by mouth once a week. Wednesday    . naproxen sodium (ANAPROX) 220 MG tablet Take 220 mg by mouth 2 (two) times daily with a meal.    . tamsulosin (FLOMAX) 0.4 MG CAPS capsule Take 0.4 mg by mouth daily.      No current facility-administered medications for this visit.     OBJECTIVE: Vitals:   06/19/16 0902  BP: 125/69  Pulse: 70  Resp: 18  Temp: (!) 96.7 F (35.9 C)     Body mass index is 25.3 kg/m.    ECOG FS:0 - Asymptomatic  General: Well-developed, well-nourished, no acute distress. Eyes: Pink conjunctiva, anicteric sclera. Lungs: Clear to auscultation bilaterally. Heart:  Regular rate and rhythm. No rubs, murmurs, or gallops. Abdomen: Soft, nontender, nondistended. No organomegaly noted, normoactive bowel sounds. Musculoskeletal: No edema, cyanosis, or clubbing. Neuro: Alert, answering all questions appropriately. Cranial nerves grossly intact. Skin: No rashes or petechiae noted. Psych: Normal affect.  LAB RESULTS:  No results found for: NA, K, CL, CO2, GLUCOSE, BUN, CREATININE, CALCIUM, PROT, ALBUMIN, AST, ALT, ALKPHOS, BILITOT,  GFRNONAA, GFRAA  Lab Results  Component Value Date   WBC 6.3 06/06/2016   HGB 12.4 (L) 06/06/2016   HCT 36.0 (L) 06/06/2016   MCV 92.3 06/06/2016   PLT 164 06/06/2016     STUDIES: Dg Chest 1 View  Result Date: 06/12/2016 CLINICAL DATA:  Bronchoscopy. EXAM: CHEST 1 VIEW COMPARISON:  PET-CT 05/20/2016 . FINDINGS: Left upper lung mass lesion noted. Right lung is clear. Cardiomegaly with normal pulmonary vascularity. Small left pneumothorax noted. IMPRESSION: Small left pneumothorax noted. Left upper lobe mass lesion again noted. Critical Value/emergent results were called by telephone at the time of interpretation on 06/12/2016 at 2:24 pm to [facility] verbally acknowledged these results. Electronically Signed   By: Marcello Moores  Register   On: 06/12/2016 14:27   Dg Chest 2 View  Result Date: 06/13/2016 CLINICAL DATA:  Left upper lobe mass, status post bronchoscopy EXAM: CHEST  2 VIEW COMPARISON:  06/12/2016 FINDINGS: Cardiac shadow is within normal limits. Large left upper lobe mass lesion with associated daughter lesion are again seen and stable. The left lung again shows a small apical pneumothorax. The apical component has increased slightly in the interval although the lateral component has decreased. No bony abnormality is seen. IMPRESSION: Relatively stable left lung pneumothorax. Stable left upper lobe mass lesion and daughter lesion. Electronically Signed   By: Inez Catalina M.D.   On: 06/13/2016 11:00   Nm Pet Image Initial (pi) Skull Base To Thigh  Result Date: 05/20/2016 CLINICAL DATA:  Initial treatment strategy for left upper lobe mass. EXAM: NUCLEAR MEDICINE PET SKULL BASE TO THIGH TECHNIQUE: 12.7 mCi F-18 FDG was injected intravenously. Full-ring PET imaging was performed from the skull base to thigh after the radiotracer. CT data was obtained and used for attenuation correction and anatomic localization. FASTING BLOOD GLUCOSE:  Value: 97 mg/dl COMPARISON:  Chest CT 05/08/2016 FINDINGS: NECK No  hypermetabolic lymph nodes in the neck. Mild glottic activity is likely physiologic. CHEST Dominant centrally necrotic 6.7 by 6.3 cm left upper lobe mass on image 90/3, maximum SUV 21.3. Pathologic been hypermetabolic right paratracheal, AP window, and right hilar adenopathy. Right lower paratracheal node 1.4 cm in short axis on image 86/3, maximum SUV 14.9. Right hilar node 2.0 cm in short axis on image 94/3, maximum SUV 14.5. The left upper lobe nodule with the thick calcification posteriorly is not hypermetabolic, and measures about 2.5 by 1.8 cm on image 74/3. Bilateral airway thickening. Coronary, aortic arch, and branch vessel atherosclerotic vascular disease. ABDOMEN/PELVIS Photopenic left-sided renal cysts. Sigmoid diverticulosis. Aortoiliac atherosclerotic vascular disease. Levoconvex rotary lumbar scoliosis. No findings of metastatic disease to the abdomen or pelvis. Mildly prominent prostate gland. SKELETON No focal hypermetabolic activity to suggest skeletal metastasis. IMPRESSION: 1. Highly hypermetabolic dominant 6.7 cm left upper lobe mass with hypermetabolic right hilar, right paratracheal, and AP window adenopathy. Assuming non-small cell lung cancer, appearance is compatible with T3b N3 M0 disease (stage IIIb). 2. The smaller left upper lobe nodule with posterior calcification seems benign, and is not hypermetabolic and could be a pulmonary hamartoma. 3. Other imaging findings of potential clinical significance: Coronary, aortic  arch, and branch vessel atherosclerotic vascular disease. Aortoiliac atherosclerotic vascular disease. Airway thickening is present, suggesting bronchitis or reactive airways disease. Thoracolumbar scoliosis. Mildly prominent prostate gland. Electronically Signed   By: Van Clines M.D.   On: 05/20/2016 12:35   Dg C-arm 1-60 Min-no Report  Result Date: 06/12/2016 Fluoroscopy was utilized by the requesting physician.  No radiographic interpretation.     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  PLAN:    1. 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 After lengthy discussion with the patient, he wishes to pursue treatment with concurrent chemotherapy and XRT. A referral was given to radiation oncology. Patient will also require port placement prior to initiating treatment. MRI the brain has been scheduled to complete the staging workup. Patient will return to clinic on June 30, 2016 to initiate cycle 1 of weekly carboplatinum and Taxol. If patient tolerates his initial treatment, will consider 2 consolidation doses of chemotherapy. 2. Pain: Continue current narcotic regimen. XRT as above.  Approximately 30 minutes was spent in discussion of which greater than 50% was consultation.  Patient expressed understanding and was in agreement with this plan. He also understands that He can call clinic at any time with any questions, concerns, or complaints.   Cancer Staging Squamous cell lung cancer, right [facility]) Staging form: Lung, AJCC 8th Edition - Clinical stage from 06/19/2016: Stage IIIC (cT3, cN3, cM0) - Signed by Lloyd Huger, MD on 06/19/2016   Lloyd Huger, MD   06/19/2016 9:13 AM

## 2016-06-19 ENCOUNTER — Other Ambulatory Visit: Payer: Self-pay

## 2016-06-19 ENCOUNTER — Inpatient Hospital Stay: Payer: Medicare Other | Attending: Oncology | Admitting: Oncology

## 2016-06-19 ENCOUNTER — Ambulatory Visit
Admission: RE | Admit: 2016-06-19 | Discharge: 2016-06-19 | Disposition: A | Discharge status: Home / Self Care | Payer: Medicare Other | Source: Ambulatory Visit | Attending: Oncology | Admitting: Oncology

## 2016-06-19 ENCOUNTER — Other Ambulatory Visit
Admission: RE | Admit: 2016-06-19 | Discharge: 2016-06-19 | Disposition: A | Discharge status: Home / Self Care | Payer: Medicare Other | Source: Ambulatory Visit | Attending: Oncology | Admitting: Oncology

## 2016-06-19 ENCOUNTER — Other Ambulatory Visit (INDEPENDENT_AMBULATORY_CARE_PROVIDER_SITE_OTHER): Payer: Self-pay | Admitting: Vascular Surgery

## 2016-06-19 VITALS — BP 125/69 | HR 70 | Temp 96.7°F | Resp 18 | Wt 186.5 lb

## 2016-06-19 DIAGNOSIS — R413 Other amnesia: Secondary | ICD-10-CM | POA: Insufficient documentation

## 2016-06-19 DIAGNOSIS — C3491 Malignant neoplasm of unspecified part of right bronchus or lung: Secondary | ICD-10-CM

## 2016-06-19 DIAGNOSIS — Z87891 Personal history of nicotine dependence: Secondary | ICD-10-CM | POA: Insufficient documentation

## 2016-06-19 DIAGNOSIS — I998 Other disorder of circulatory system: Secondary | ICD-10-CM

## 2016-06-19 DIAGNOSIS — C3412 Malignant neoplasm of upper lobe, left bronchus or lung: Secondary | ICD-10-CM | POA: Diagnosis not present

## 2016-06-19 DIAGNOSIS — E871 Hypo-osmolality and hyponatremia: Secondary | ICD-10-CM | POA: Insufficient documentation

## 2016-06-19 DIAGNOSIS — K5730 Diverticulosis of large intestine without perforation or abscess without bleeding: Secondary | ICD-10-CM | POA: Diagnosis not present

## 2016-06-19 DIAGNOSIS — I2510 Atherosclerotic heart disease of native coronary artery without angina pectoris: Secondary | ICD-10-CM

## 2016-06-19 DIAGNOSIS — Z806 Family history of leukemia: Secondary | ICD-10-CM | POA: Diagnosis not present

## 2016-06-19 DIAGNOSIS — M25512 Pain in left shoulder: Secondary | ICD-10-CM | POA: Diagnosis not present

## 2016-06-19 DIAGNOSIS — I517 Cardiomegaly: Secondary | ICD-10-CM

## 2016-06-19 DIAGNOSIS — R350 Frequency of micturition: Secondary | ICD-10-CM | POA: Insufficient documentation

## 2016-06-19 DIAGNOSIS — Z87442 Personal history of urinary calculi: Secondary | ICD-10-CM | POA: Diagnosis not present

## 2016-06-19 DIAGNOSIS — Z7189 Other specified counseling: Secondary | ICD-10-CM

## 2016-06-19 DIAGNOSIS — Z5111 Encounter for antineoplastic chemotherapy: Secondary | ICD-10-CM | POA: Diagnosis not present

## 2016-06-19 DIAGNOSIS — I700 Atherosclerosis of aorta: Secondary | ICD-10-CM

## 2016-06-19 DIAGNOSIS — Z79899 Other long term (current) drug therapy: Secondary | ICD-10-CM | POA: Diagnosis not present

## 2016-06-19 DIAGNOSIS — R634 Abnormal weight loss: Secondary | ICD-10-CM

## 2016-06-19 DIAGNOSIS — R590 Localized enlarged lymph nodes: Secondary | ICD-10-CM | POA: Diagnosis not present

## 2016-06-19 DIAGNOSIS — M2550 Pain in unspecified joint: Secondary | ICD-10-CM | POA: Diagnosis not present

## 2016-06-19 LAB — BASIC METABOLIC PANEL
Anion gap: 6 (ref 5–15)
BUN: 21 mg/dL — ABNORMAL HIGH (ref 6–20)
CALCIUM: 8.7 mg/dL — AB (ref 8.9–10.3)
CO2: 27 mmol/L (ref 22–32)
CREATININE: 0.87 mg/dL (ref 0.61–1.24)
Chloride: 102 mmol/L (ref 101–111)
GFR calc Af Amer: >60 mL/min (ref >60)
GFR calc non Af Amer: >60 mL/min (ref >60)
GLUCOSE: 100 mg/dL — AB (ref 65–99)
Potassium: 4.2 mmol/L (ref 3.5–5.1)
Sodium: 135 mmol/L (ref 135–145)

## 2016-06-19 LAB — SURGICAL PATHOLOGY

## 2016-06-19 LAB — CYTOLOGY - NON PAP

## 2016-06-19 MED ORDER — GADOBENATE DIMEGLUMINE 529 MG/ML IV SOLN
20.0000 mL | Freq: Once | INTRAVENOUS | Status: AC | PRN
  Administered 2016-06-19: 17 mL via INTRAVENOUS

## 2016-06-19 NOTE — Progress Notes (Signed)
Feeling well today. States feels like is getting another kidney stone.

## 2016-06-19 NOTE — Patient Instructions (Signed)

## 2016-06-22 DIAGNOSIS — Z7189 Other specified counseling: Secondary | ICD-10-CM | POA: Insufficient documentation

## 2016-06-22 MED ORDER — LIDOCAINE-PRILOCAINE 2.5-2.5 % EX CREA: TOPICAL_CREAM | CUTANEOUS | 3 refills | Status: DC

## 2016-06-22 MED ORDER — ONDANSETRON HCL 8 MG PO TABS: 8.0000 mg | ORAL_TABLET | Freq: Two times a day (BID) | ORAL | 2 refills | Status: DC | PRN

## 2016-06-22 MED ORDER — PROCHLORPERAZINE MALEATE 10 MG PO TABS
10.0000 mg | ORAL_TABLET | Freq: Four times a day (QID) | ORAL | 2 refills | Status: DC | PRN
Start: 2016-06-22 — End: 2016-09-29

## 2016-06-22 NOTE — Progress Notes (Signed)
START ON PATHWAY REGIMEN - Non-Small Cell Lung     Administer weekly:     Paclitaxel      Carboplatin   **Always confirm dose/schedule in your pharmacy ordering system**    Patient Characteristics: Stage III - Unresectable, PS = 0, 1 AJCC T Category: T3 Current Disease Status: No Distant Mets or Local Recurrence AJCC N Category: N3 AJCC M Category: M0 AJCC 8 Stage Grouping: IIIC Performance Status: PS = 0, 1 Intent of Therapy: Curative Intent, Discussed with Patient 

## 2016-06-24 ENCOUNTER — Other Ambulatory Visit: Payer: Self-pay | Admitting: Oncology

## 2016-06-24 ENCOUNTER — Inpatient Hospital Stay: Payer: Medicare Other

## 2016-06-24 ENCOUNTER — Encounter: Payer: Self-pay | Admitting: Radiation Oncology

## 2016-06-24 ENCOUNTER — Other Ambulatory Visit: Payer: Self-pay

## 2016-06-24 ENCOUNTER — Ambulatory Visit
Admission: RE | Admit: 2016-06-24 | Discharge: 2016-06-24 | Disposition: A | Discharge status: Home / Self Care | Payer: Medicare Other | Source: Ambulatory Visit | Attending: Radiation Oncology | Admitting: Radiation Oncology

## 2016-06-24 VITALS — BP 125/69 | HR 63 | Temp 97.9°F | Wt 184.3 lb

## 2016-06-24 DIAGNOSIS — R05 Cough: Secondary | ICD-10-CM | POA: Insufficient documentation

## 2016-06-24 DIAGNOSIS — C3491 Malignant neoplasm of unspecified part of right bronchus or lung: Secondary | ICD-10-CM

## 2016-06-24 DIAGNOSIS — M069 Rheumatoid arthritis, unspecified: Secondary | ICD-10-CM | POA: Insufficient documentation

## 2016-06-24 DIAGNOSIS — F17210 Nicotine dependence, cigarettes, uncomplicated: Secondary | ICD-10-CM | POA: Insufficient documentation

## 2016-06-24 DIAGNOSIS — Z87442 Personal history of urinary calculi: Secondary | ICD-10-CM | POA: Insufficient documentation

## 2016-06-24 DIAGNOSIS — C3412 Malignant neoplasm of upper lobe, left bronchus or lung: Secondary | ICD-10-CM | POA: Insufficient documentation

## 2016-06-24 DIAGNOSIS — I998 Other disorder of circulatory system: Secondary | ICD-10-CM | POA: Insufficient documentation

## 2016-06-24 DIAGNOSIS — Z510 Encounter for antineoplastic radiation therapy: Secondary | ICD-10-CM | POA: Insufficient documentation

## 2016-06-24 DIAGNOSIS — Z809 Family history of malignant neoplasm, unspecified: Secondary | ICD-10-CM | POA: Insufficient documentation

## 2016-06-24 DIAGNOSIS — R634 Abnormal weight loss: Secondary | ICD-10-CM | POA: Insufficient documentation

## 2016-06-24 DIAGNOSIS — R599 Enlarged lymph nodes, unspecified: Secondary | ICD-10-CM | POA: Insufficient documentation

## 2016-06-24 DIAGNOSIS — M25512 Pain in left shoulder: Secondary | ICD-10-CM | POA: Insufficient documentation

## 2016-06-24 MED ORDER — OXYCODONE-ACETAMINOPHEN 5-325 MG PO TABS: 1.0000 | ORAL_TABLET | Freq: Four times a day (QID) | ORAL | 0 refills | Status: DC | PRN

## 2016-06-24 NOTE — Consult Note (Signed)
NEW PATIENT EVALUATION  Name: Frank Cowan  MRN: [id number]  Date:   06/24/2016     DOB: June 17, 1940   This 76 y.o. 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  REFERRING PHYSICIAN: Claiborne Billings, MD  CHIEF COMPLAINT:  Chief Complaint  Patient presents with  . Lung Cancer    DIAGNOSIS: The encounter diagnosis was Cancer of upper lobe of left lung ([place]).   PREVIOUS INVESTIGATIONS:  CT scans and PET/CT scan reviewed Pathology report reviewed Clinical notes reviewed  HPI: Patient is a 76 year old male who presented with a 60 pound weight loss and significant left shoulder pain. He has a history of rheumatoid arthritis on methotrexate for that. Plain film the chest showed a left upper lobe mass confirmed on CT scan. 100 and tablet PET CT scan showing a highly hypermetabolic mass measuring 6.7 cm and left upper lobe with right hilar right paratracheal hypermetabolic adenopathy. There is also a smaller left upper lobe nodule with posterior calcifications which seems benign. Patient underwent transbronchial bronchial ultrasound-guided biopsy showing metastatic squamous cell carcinoma right paratracheal lymph node. He has been seen by medical oncology is slated to start chemotherapy next week. He is seen today for radiation oncology opinion. He is a mild cough fairly dry no hemoptysis. He does have some dyspnea on exertion. Continues have significant pain in his left upper extremity in the shoulder region.  PLANNED TREATMENT REGIMEN: Concurrent chemoradiation with curative intent  PAST MEDICAL HISTORY:  has a past medical history of Cataract; Collagen vascular disease ([place]); History of kidney stones; and Kidney stones (2014).    PAST SURGICAL HISTORY:  Past Surgical History:  Procedure Laterality Date  . CYSTOSCOPY W/ URETEROSCOPY W/ LITHOTRIPSY    . ENDOBRONCHIAL ULTRASOUND N/A 06/12/2016   Procedure:  ENDOBRONCHIAL ULTRASOUND;  Surgeon: Laverle Hobby, MD;  Location: [facility] ORS;  Service: Pulmonary;  Laterality: N/A;  . TONSILLECTOMY      FAMILY HISTORY: family history includes Cancer (age of onset: 102) in his sister; Heart disease in his father and mother.  SOCIAL HISTORY:  reports that he has been smoking.  He has a 27.50 pack-year smoking history. He has quit using smokeless tobacco. He reports that he does not drink alcohol or use drugs.  ALLERGIES: Patient has no known allergies.  MEDICATIONS:  Current Outpatient Prescriptions  Medication Sig Dispense Refill  . acetaminophen (TYLENOL) 500 MG tablet Take 500 mg by mouth every 8 (eight) hours as needed for mild pain or headache. On hold for procedure    . folic acid (FOLVITE) 1 MG tablet Take 1 mg by mouth daily.     Marland Kitchen lidocaine-prilocaine (EMLA) cream Apply to affected area once 30 g 3  . methotrexate (RHEUMATREX) 2.5 MG tablet Take 20 mg by mouth once a week. Wednesday    . naproxen sodium (ANAPROX) 220 MG tablet Take 220 mg by mouth 2 (two) times daily with a meal.    . ondansetron (ZOFRAN) 8 MG tablet Take 1 tablet (8 mg total) by mouth 2 (two) times daily as needed for refractory nausea / vomiting. 60 tablet 2  . oxyCODONE-acetaminophen (PERCOCET/ROXICET) 5-325 MG tablet Take 1-2 tablets by mouth every 6 (six) hours as needed for severe pain. 60 tablet 0  . prochlorperazine (COMPAZINE) 10 MG tablet Take 1 tablet (10 mg total) by mouth every 6 (six) hours as needed (Nausea or vomiting). 60 tablet 2  . tamsulosin (FLOMAX)  0.4 MG CAPS capsule Take 0.4 mg by mouth daily.      No current facility-administered medications for this encounter.     ECOG PERFORMANCE STATUS:  1 - Symptomatic but completely ambulatory  REVIEW OF SYSTEMS: Except for the weight loss shoulder pain and mild slightly productive cough Patient denies any weight loss, fatigue, weakness, fever, chills or night sweats. Patient denies any loss of vision, blurred  vision. Patient denies any ringing  of the ears or hearing loss. No irregular heartbeat. Patient denies heart murmur or history of fainting. Patient denies any chest pain or pain radiating to her upper extremities. Patient denies any shortness of breath, difficulty breathing at night, cough or hemoptysis. Patient denies any swelling in the lower legs. Patient denies any nausea vomiting, vomiting of blood, or coffee ground material in the vomitus. Patient denies any stomach pain. Patient states has had normal bowel movements no significant constipation or diarrhea. Patient denies any dysuria, hematuria or significant nocturia. Patient denies any problems walking, swelling in the joints or loss of balance. Patient denies any skin changes, loss of hair or loss of weight. Patient denies any excessive worrying or anxiety or significant depression. Patient denies any problems with insomnia. Patient denies excessive thirst, polyuria, polydipsia. Patient denies any swollen glands, patient denies easy bruising or easy bleeding. Patient denies any recent infections, allergies or URI. Patient "s visual fields have not changed significantly in recent time.    PHYSICAL EXAM: BP 125/69   Pulse 63   Temp 97.9 F (36.6 C)   Wt 184 lb 4.9 oz (83.6 kg)   BMI 25.00 kg/m  Well-developed male in NAD. Does have point tenderness in the left shoulder. Range of motion does not elicit pain in his left shoulder. He does have some coarse rhonchi in the left upper lobe of his lung. Well-developed well-nourished patient in NAD. HEENT reveals PERLA, EOMI, discs not visualized.  Oral cavity is clear. No oral mucosal lesions are identified. Neck is clear without evidence of cervical or supraclavicular adenopathy. Lungs are clear to A&P. Cardiac examination is essentially unremarkable with regular rate and rhythm without murmur rub or thrill. Abdomen is benign with no organomegaly or masses noted. Motor sensory and DTR levels are equal  and symmetric in the upper and lower extremities. Cranial nerves II through XII are grossly intact. Proprioception is intact. No peripheral adenopathy or edema is identified. No motor or sensory levels are noted. Crude visual fields are within normal range.  LABORATORY DATA: Cytology and pathology reports reviewed    RADIOLOGY RESULTS: CT scan PET CT scan and MRI of brain all reviewed and compatible above-stated findings   IMPRESSION: Stage IIIB squamous cell carcinoma the left upper lobe in 76 year old male  PLAN: At this time I have recommended concurrent chemoradiation. Would plan on delivering 6600 cGy using I MRT treatment planning and delivery. I would choose I am RT based on the need to treat both the large left upper lobe mass as well as contralateral hypermetabolic metastatic nodes. Risks and benefits of treatment including possible dysphasia from radiation esophagitis, increasing cough, fatigue alteration of blood counts skin reaction and loss of normal lung volume all were discussed in detail with the patient and his daughter. We will coordinate his chemotherapy with his radiation treatments.There will be extra effort by both professional staff as well as technical staff to coordinate and manage concurrent chemoradiation and ensuing side effects during his treatments. I personally set up and ordered CT simulation for later  this week. I do believe the pain in his left shoulder may be of arthritic in nature such as negative on PET CT on review.  I would like to take this opportunity to thank you for allowing me to participate in the care of your patient.Armstead Peaks., MD

## 2016-06-25 ENCOUNTER — Ambulatory Visit
Admission: RE | Admit: 2016-06-25 | Discharge: 2016-06-25 | Disposition: A | Discharge status: Home / Self Care | Payer: Medicare Other | Source: Ambulatory Visit | Attending: Radiation Oncology | Admitting: Radiation Oncology

## 2016-06-25 DIAGNOSIS — Z809 Family history of malignant neoplasm, unspecified: Secondary | ICD-10-CM | POA: Diagnosis not present

## 2016-06-25 DIAGNOSIS — C3412 Malignant neoplasm of upper lobe, left bronchus or lung: Secondary | ICD-10-CM | POA: Diagnosis not present

## 2016-06-25 DIAGNOSIS — M25512 Pain in left shoulder: Secondary | ICD-10-CM | POA: Diagnosis not present

## 2016-06-25 DIAGNOSIS — R634 Abnormal weight loss: Secondary | ICD-10-CM | POA: Diagnosis not present

## 2016-06-25 DIAGNOSIS — Z510 Encounter for antineoplastic radiation therapy: Secondary | ICD-10-CM | POA: Diagnosis not present

## 2016-06-25 DIAGNOSIS — Z87442 Personal history of urinary calculi: Secondary | ICD-10-CM | POA: Diagnosis not present

## 2016-06-25 DIAGNOSIS — M069 Rheumatoid arthritis, unspecified: Secondary | ICD-10-CM | POA: Diagnosis not present

## 2016-06-25 DIAGNOSIS — F17210 Nicotine dependence, cigarettes, uncomplicated: Secondary | ICD-10-CM | POA: Diagnosis not present

## 2016-06-25 DIAGNOSIS — R599 Enlarged lymph nodes, unspecified: Secondary | ICD-10-CM | POA: Diagnosis not present

## 2016-06-25 DIAGNOSIS — I998 Other disorder of circulatory system: Secondary | ICD-10-CM | POA: Diagnosis not present

## 2016-06-25 DIAGNOSIS — R05 Cough: Secondary | ICD-10-CM | POA: Diagnosis not present

## 2016-06-26 ENCOUNTER — Encounter: Payer: Self-pay | Admitting: *Deleted

## 2016-06-26 ENCOUNTER — Encounter
Admission: RE | Disposition: A | Discharge status: Home / Self Care | Payer: Self-pay | Source: Ambulatory Visit | Attending: Vascular Surgery

## 2016-06-26 ENCOUNTER — Ambulatory Visit
Admission: RE | Admit: 2016-06-26 | Discharge: 2016-06-26 | Disposition: A | Discharge status: Home / Self Care | Payer: Medicare Other | Source: Ambulatory Visit | Attending: Vascular Surgery | Admitting: Vascular Surgery

## 2016-06-26 DIAGNOSIS — Z8249 Family history of ischemic heart disease and other diseases of the circulatory system: Secondary | ICD-10-CM | POA: Insufficient documentation

## 2016-06-26 DIAGNOSIS — Z87442 Personal history of urinary calculi: Secondary | ICD-10-CM | POA: Diagnosis not present

## 2016-06-26 DIAGNOSIS — Z806 Family history of leukemia: Secondary | ICD-10-CM | POA: Insufficient documentation

## 2016-06-26 DIAGNOSIS — F17210 Nicotine dependence, cigarettes, uncomplicated: Secondary | ICD-10-CM | POA: Diagnosis not present

## 2016-06-26 DIAGNOSIS — Z9889 Other specified postprocedural states: Secondary | ICD-10-CM | POA: Insufficient documentation

## 2016-06-26 DIAGNOSIS — M359 Systemic involvement of connective tissue, unspecified: Secondary | ICD-10-CM | POA: Diagnosis not present

## 2016-06-26 DIAGNOSIS — H269 Unspecified cataract: Secondary | ICD-10-CM | POA: Diagnosis not present

## 2016-06-26 DIAGNOSIS — C3491 Malignant neoplasm of unspecified part of right bronchus or lung: Secondary | ICD-10-CM | POA: Diagnosis not present

## 2016-06-26 DIAGNOSIS — G893 Neoplasm related pain (acute) (chronic): Secondary | ICD-10-CM | POA: Diagnosis not present

## 2016-06-26 DIAGNOSIS — C3490 Malignant neoplasm of unspecified part of unspecified bronchus or lung: Secondary | ICD-10-CM | POA: Diagnosis present

## 2016-06-26 HISTORY — DX: Malignant (primary) neoplasm, unspecified: C80.1

## 2016-06-26 HISTORY — PX: PORTA CATH INSERTION: CATH118285

## 2016-06-26 SURGERY — PORTA CATH INSERTION
Anesthesia: Moderate Sedation

## 2016-06-26 MED ORDER — MIDAZOLAM HCL 5 MG/5ML IJ SOLN
INTRAMUSCULAR | Status: AC
  Filled 2016-06-26: qty 5

## 2016-06-26 MED ORDER — HEPARIN (PORCINE) IN NACL 2-0.9 UNIT/ML-% IJ SOLN
INTRAMUSCULAR | Status: AC
  Filled 2016-06-26: qty 500

## 2016-06-26 MED ORDER — LIDOCAINE-EPINEPHRINE (PF) 2 %-1:200000 IJ SOLN
INTRAMUSCULAR | Status: AC
  Filled 2016-06-26: qty 40

## 2016-06-26 MED ORDER — FENTANYL CITRATE (PF) 100 MCG/2ML IJ SOLN
INTRAMUSCULAR | Status: DC | PRN
  Administered 2016-06-26: 50 ug via INTRAVENOUS

## 2016-06-26 MED ORDER — SODIUM CHLORIDE 0.9 % IR SOLN
Freq: Once | Status: DC
  Filled 2016-06-26: qty 2

## 2016-06-26 MED ORDER — METHYLPREDNISOLONE SODIUM SUCC 125 MG IJ SOLR: 125.0000 mg | INTRAMUSCULAR | Status: DC | PRN

## 2016-06-26 MED ORDER — LIDOCAINE HCL (PF) 1 % IJ SOLN
INTRAMUSCULAR | Status: AC
  Filled 2016-06-26: qty 30

## 2016-06-26 MED ORDER — MIDAZOLAM HCL 2 MG/2ML IJ SOLN
INTRAMUSCULAR | Status: DC | PRN
  Administered 2016-06-26: 2 mg via INTRAVENOUS

## 2016-06-26 MED ORDER — FAMOTIDINE 20 MG PO TABS
40.0000 mg | ORAL_TABLET | ORAL | Status: DC | PRN
Start: 2016-06-26 — End: 2016-06-28

## 2016-06-26 MED ORDER — SODIUM CHLORIDE 0.9 % IV SOLN
INTRAVENOUS | Status: DC
  Administered 2016-06-26: 10:00:00 via INTRAVENOUS

## 2016-06-26 MED ORDER — FENTANYL CITRATE (PF) 100 MCG/2ML IJ SOLN
INTRAMUSCULAR | Status: AC
Start: 2016-06-26 — End: 2016-06-26
  Filled 2016-06-26: qty 2

## 2016-06-26 MED ORDER — CEFAZOLIN IN D5W 1 GM/50ML IV SOLN
1.0000 g | Freq: Once | INTRAVENOUS | Status: AC
  Administered 2016-06-26: 1 g via INTRAVENOUS

## 2016-06-26 SURGICAL SUPPLY — 7 items
KIT PORT POWER 8FR ISP CVUE (Catheter) ×3 IMPLANT
PACK ANGIOGRAPHY (CUSTOM PROCEDURE TRAY) ×3 IMPLANT
PAD GROUND ADULT SPLIT (MISCELLANEOUS) ×3 IMPLANT
PENCIL ELECTRO HAND CTR (MISCELLANEOUS) ×3 IMPLANT
SUT MNCRL AB 4-0 PS2 18 (SUTURE) ×3 IMPLANT
SUT PROLENE 0 CT 1 30 (SUTURE) ×3 IMPLANT
SUTURE VIC 3-0 (SUTURE) ×3 IMPLANT

## 2016-06-26 NOTE — Discharge Instructions (Signed)
Implanted Port Insertion, Care After °This sheet gives you information about how to care for yourself after your procedure. Your health care provider may also give you more specific instructions. If you have problems or questions, contact your health care provider. °What can I expect after the procedure? °After your procedure, it is common to have: °· Discomfort at the port insertion site. °· Bruising on the skin over the port. This should improve over 3-4 days. ° °Follow these instructions at home: °Port care °· After your port is placed, you will get a manufacturer's information card. The card has information about your port. Keep this card with you at all times. °· Take care of the port as told by your health care provider. Ask your health care provider if you or a family member can get training for taking care of the port at home. A home health care nurse may also take care of the port. °· Make sure to remember what type of port you have. °Incision care °· Follow instructions from your health care provider about how to take care of your port insertion site. Make sure you: °? Wash your hands with soap and water before you change your bandage (dressing). If soap and water are not available, use hand sanitizer. °? Change your dressing as told by your health care provider. °? Leave stitches (sutures), skin glue, or adhesive strips in place. These skin closures may need to stay in place for 2 weeks or longer. If adhesive strip edges start to loosen and curl up, you may trim the loose edges. Do not remove adhesive strips completely unless your health care provider tells you to do that. °· Check your port insertion site every day for signs of infection. Check for: °? More redness, swelling, or pain. °? More fluid or blood. °? Warmth. °? Pus or a bad smell. °General instructions °· Do not take baths, swim, or use a hot tub until your health care provider approves. °· Do not lift anything that is heavier than 10 lb (4.5  kg) for a week, or as told by your health care provider. °· Ask your health care provider when it is okay to: °? Return to work or school. °? Resume usual physical activities or sports. °· Do not drive for 24 hours if you were given a medicine to help you relax (sedative). °· Take over-the-counter and prescription medicines only as told by your health care provider. °· Wear a medical alert bracelet in case of an emergency. This will tell any health care providers that you have a port. °· Keep all follow-up visits as told by your health care provider. This is important. °Contact a health care provider if: °· You cannot flush your port with saline as directed, or you cannot draw blood from the port. °· You have a fever or chills. °· You have more redness, swelling, or pain around your port insertion site. °· You have more fluid or blood coming from your port insertion site. °· Your port insertion site feels warm to the touch. °· You have pus or a bad smell coming from the port insertion site. °Get help right away if: °· You have chest pain or shortness of breath. °· You have bleeding from your port that you cannot control. °Summary °· Take care of the port as told by your health care provider. °· Change your dressing as told by your health care provider. °· Keep all follow-up visits as told by your health care provider. °  This information is not intended to replace advice given to you by your health care provider. Make sure you discuss any questions you have with your health care provider. °Document Released: 01/19/2013 Document Revised: 02/20/2016 Document Reviewed: 02/20/2016 °Elsevier Interactive Patient Education © 2017 [employer] ° °

## 2016-06-26 NOTE — H&P (Signed)
[facility] History & Physical Update  The patient was interviewed and re-examined.  The patient's previous History and Physical has been reviewed and is unchanged.  There is no change in the plan of care. We plan to proceed with the scheduled procedure.  Leotis Pain, MD  06/26/2016, 10:51 AM

## 2016-06-26 NOTE — Op Note (Signed)
      [facility]       Operative Note  Date: 06/26/2016  Preoperative diagnosis:  1. Lung cancer  Postoperative diagnosis:  Same as above  Procedures: #1. Ultrasound guidance for vascular access to the right internal jugular vein. #2. Fluoroscopic guidance for placement of catheter. #3. Placement of CT compatible Port-A-Cath, right internal jugular vein.  Surgeon: Leotis Pain, MD.   Anesthesia: Local with moderate conscious sedation for approximately 15  minutes using 2 mg of Versed and 50 mcg of Fentanyl  Fluoroscopy time: less than 1 minute  Contrast used: 0  Estimated blood loss: minimal  Indication for the procedure:  The patient is a 76 y.o.male with lung cancer.  The patient needs a Port-A-Cath for durable venous access, chemotherapy, lab draws, and CT scans. We are asked to place this. Risks and benefits were discussed and informed consent was obtained.  Description of procedure: The patient was brought to the vascular and interventional radiology suite.  Moderate conscious sedation was administered throughout the procedure during a face to face encounter with the patient with my supervision of the RN administering medicines and monitoring the patient's vital signs, pulse oximetry, telemetry and mental status throughout from the start of the procedure until the patient was taken to the recovery room. The right neck chest and shoulder were sterilely prepped and draped, and a sterile surgical field was created. Ultrasound was used to help visualize a patent right internal jugular vein. This was then accessed under direct ultrasound guidance without difficulty with the Seldinger needle and a permanent image was recorded. A J-wire was placed. After skin nick and dilatation, the peel-away sheath was then placed over the wire. I then anesthetized an area under the clavicle approximately 1-2 fingerbreadths. A transverse incision was created and an inferior pocket was  created with electrocautery and blunt dissection. The port was then brought onto the field, placed into the pocket and secured to the chest wall with 2 Prolene sutures. The catheter was connected to the port and tunneled from the subclavicular incision to the access site. Fluoroscopic guidance was then used to cut the catheter to an appropriate length. The catheter was then placed through the peel-away sheath and the peel-away sheath was removed. The catheter tip was parked in excellent location under fluorocoscopic guidance in the SVC just above the right atrium. The pocket was then irrigated with antibiotic impregnated saline and the wound was closed with a running 3-0 Vicryl and a 4-0 Monocryl. The access incision was closed with a single 4-0 Monocryl. The Huber needle was used to withdraw blood and flush the port with heparinized saline. Dermabond was then placed as a dressing. The patient tolerated the procedure well and was taken to the recovery room in stable condition.   Leotis Pain 06/26/2016 11:23 AM   This note was created with Dragon Medical transcription system. Any errors in dictation are purely unintentional.

## 2016-06-27 ENCOUNTER — Encounter: Payer: Self-pay | Admitting: Vascular Surgery

## 2016-06-29 NOTE — Progress Notes (Signed)
Frank Cowan  [phone] [phone]) [phone]  ID: [id number] BASALDUA OB: [birth year]  MR#: [id number]  PNT#:[id number]  [name], MD as PCP - General (Family Medicine)  CHIEF COMPLAINT: Clinical stage IIIc squamous cell carcinoma of the upper lobe of left lung.  INTERVAL HISTORY: Patient returns to clinic today for cycle 1 Carbo/taxol. He continues to have left shoulder pain and no appetite. He has been trying to eat but sometimes just doesn't feel like it. He has not tried any protein supplements such as ensures. He is feeling tired this morning but his daughter states this is due to taking a pain pill this morning. He otherwise feels well. He has no neurologic complaints. He denies any recent fevers. He has no chest pain, shortness of breath, cough, or hemoptysis. He denies any nausea, vomiting, constipation, or diarrhea. He has no urinary complaints. Patient offers no further specific complaints today.  REVIEW OF SYSTEMS:   Review of Systems  Constitutional: Positive for weight loss. Negative for fever and malaise/fatigue.  Respiratory: Negative.  Negative for cough, hemoptysis and shortness of breath.   Cardiovascular: Negative.  Negative for chest pain and leg swelling.  Gastrointestinal: Negative.  Negative for abdominal pain.  Genitourinary: Negative.  Negative for frequency.  Musculoskeletal: Positive for joint pain.  Neurological: Negative.  Negative for sensory change and weakness.  Psychiatric/Behavioral: Negative.  The patient is not nervous/anxious.     As per HPI. Otherwise, a complete review of systems is negative.  PAST MEDICAL HISTORY: Past Medical History:  Diagnosis Date  . Cancer ([place])    lung  . Cataract   . Collagen vascular disease ([place])   . History of kidney stones   . Kidney stones 2014    PAST SURGICAL HISTORY: Past Surgical History:  Procedure Laterality Date  . CYSTOSCOPY W/ URETEROSCOPY W/ LITHOTRIPSY     . ENDOBRONCHIAL ULTRASOUND N/A 06/12/2016   Procedure: ENDOBRONCHIAL ULTRASOUND;  Surgeon: Laverle Hobby, MD;  Location: [facility] ORS;  Service: Pulmonary;  Laterality: N/A;  . PORTA CATH INSERTION N/A 06/26/2016   Procedure: Glori Frank Cath Insertion;  Surgeon: Algernon Huxley, MD;  Location: [facility];  Service: Cardiovascular;  Laterality: N/A;  . TONSILLECTOMY      FAMILY HISTORY: Family History  Problem Relation Age of Onset  . Heart disease Mother   . Heart disease Father   . Cancer Sister 78    luekemia    ADVANCED DIRECTIVES (Y/N):  N  HEALTH MAINTENANCE: Social History  Substance Use Topics  . Smoking status: Former Smoker    Packs/day: 0.50    Years: 55.00    Quit date: 03/28/2016  . Smokeless tobacco: Former Systems developer  . Alcohol use No     Comment: Not lately     Colonoscopy:  PAP:  Bone density:  Lipid panel:  No Known Allergies  Current Outpatient Prescriptions  Medication Sig Dispense Refill  . acetaminophen (TYLENOL) 500 MG tablet Take 500 mg by mouth every 8 (eight) hours as needed for mild pain or headache. On hold for procedure    . folic acid (FOLVITE) 1 MG tablet Take 1 mg by mouth daily.     Marland Kitchen lidocaine-prilocaine (EMLA) cream Apply to affected area once 30 g 3  . methotrexate (RHEUMATREX) 2.5 MG tablet Take 20 mg by mouth once a week. Wednesday    . naproxen sodium (ANAPROX) 220 MG tablet Take 220 mg by mouth 2 (two) times daily with a meal.    .  ondansetron (ZOFRAN) 8 MG tablet Take 1 tablet (8 mg total) by mouth 2 (two) times daily as needed for refractory nausea / vomiting. 60 tablet 2  . oxyCODONE-acetaminophen (PERCOCET/ROXICET) 5-325 MG tablet Take 1-2 tablets by mouth every 6 (six) hours as needed for severe pain. 60 tablet 0  . prochlorperazine (COMPAZINE) 10 MG tablet Take 1 tablet (10 mg total) by mouth every 6 (six) hours as needed (Nausea or vomiting). 60 tablet 2  . tamsulosin (FLOMAX) 0.4 MG CAPS capsule Take 0.4 mg by mouth daily.      . megestrol (MEGACE) 40 MG tablet Take 1 tablet (40 mg total) by mouth daily. 30 tablet 1   No current facility-administered medications for this visit.     OBJECTIVE: Vitals:   06/30/16 0905  BP: 117/71  Pulse: 66  Resp: 18  Temp: 97 F (36.1 C)     Body mass index is 24.52 kg/m.    ECOG FS:0 - Asymptomatic  General: Well-developed, well-nourished, no acute distress. Eyes: Pink conjunctiva, anicteric sclera. Lungs: Clear to auscultation bilaterally. Heart: Regular rate and rhythm. No rubs, murmurs, or gallops. Abdomen: Soft, nontender, nondistended. No organomegaly noted, normoactive bowel sounds. Musculoskeletal: No edema, cyanosis, or clubbing. Neuro: Alert, answering all questions appropriately. Cranial nerves grossly intact. Skin: No rashes or petechiae noted. Psych: Normal affect.  LAB RESULTS:  Lab Results  Component Value Date   NA 133 (L) 06/30/2016   K 3.6 06/30/2016   CL 99 (L) 06/30/2016   CO2 28 06/30/2016   GLUCOSE 107 (H) 06/30/2016   BUN 20 06/30/2016   CREATININE 0.78 06/30/2016   CALCIUM 9.0 06/30/2016   PROT 6.9 06/30/2016   ALBUMIN 3.2 (L) 06/30/2016   AST 20 06/30/2016   ALT 16 (L) 06/30/2016   ALKPHOS 70 06/30/2016   BILITOT 0.8 06/30/2016   GFRNONAA >60 06/30/2016   GFRAA >60 06/30/2016    Lab Results  Component Value Date   WBC 7.4 06/30/2016   NEUTROABS 5.1 06/30/2016   HGB 12.1 (L) 06/30/2016   HCT 34.5 (L) 06/30/2016   MCV 91.0 06/30/2016   PLT 184 06/30/2016     STUDIES: Dg Chest 1 View  Result Date: 06/12/2016 CLINICAL DATA:  Bronchoscopy. EXAM: CHEST 1 VIEW COMPARISON:  PET-CT 05/20/2016 . FINDINGS: Left upper lung mass lesion noted. Right lung is clear. Cardiomegaly with normal pulmonary vascularity. Small left pneumothorax noted. IMPRESSION: Small left pneumothorax noted. Left upper lobe mass lesion again noted. Critical Value/emergent results were called by telephone at the time of interpretation on 06/12/2016 at 2:24 pm to  [facility] verbally acknowledged these results. Electronically Signed   By: Marcello Moores  Register   On: 06/12/2016 14:27   Dg Chest 2 View  Result Date: 06/13/2016 CLINICAL DATA:  Left upper lobe mass, status post bronchoscopy EXAM: CHEST  2 VIEW COMPARISON:  06/12/2016 FINDINGS: Cardiac shadow is within normal limits. Large left upper lobe mass lesion with associated daughter lesion are again seen and stable. The left lung again shows a small apical pneumothorax. The apical component has increased slightly in the interval although the lateral component has decreased. No bony abnormality is seen. IMPRESSION: Relatively stable left lung pneumothorax. Stable left upper lobe mass lesion and daughter lesion. Electronically Signed   By: Inez Catalina M.D.   On: 06/13/2016 11:00   Mr Jeri Cos PX Contrast  Result Date: 06/19/2016 CLINICAL DATA:  76 y/o  M; initial staging of lung cancer. EXAM: MRI HEAD WITHOUT AND WITH CONTRAST TECHNIQUE: Multiplanar, multiecho  pulse sequences of the brain and surrounding structures were obtained without and with intravenous contrast. CONTRAST:  31m MULTIHANCE GADOBENATE DIMEGLUMINE 529 MG/ML IV SOLN COMPARISON:  05/20/2016 PET-CT.  04/10/2012 CT head. FINDINGS: Brain: T2 FLAIR hyperintense foci within subcortical and periventricular white matter are compatible with mild chronic microvascular ischemic changes for age. Mild brain parenchymal volume loss. No diffusion signal abnormality. No abnormal susceptibility hypointensity to indicate intracranial hemorrhage. No hydrocephalus or extra-axial collection. No effacement of basilar cisterns. After administration of intravenous contrast there is no abnormal enhancement of the brain. Vascular: Normal flow voids. Skull and upper cervical spine: Normal marrow signal. Sinuses/Orbits: Mild anterior ethmoid and maxillary sinus mucosal thickening. No abnormal signal of mastoid air cells. Bilateral intra-ocular lens replacement. Other: None.  IMPRESSION: 1. No intracranial metastatic disease is identified. 2. Mild chronic microvascular ischemic changes and mild parenchymal volume loss of the brain for age. Electronically Signed   By: LKristine GarbeM.D.   On: 06/19/2016 14:29   Dg C-arm 1-60 Min-no Report  Result Date: 06/12/2016 Fluoroscopy was utilized by the requesting physician.  No radiographic interpretation.    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  PLAN:    1. Cycle 1 Carbo/Taxol today. Labs okay for treatment. 2. XRT begins Monday 3/23.  3. Weight loss: Down 8 lbs since 3/8. Referral to dietary. Scheduled for Monday 3/23. Prescription for Megace 40 mg daily given. Encouraged protein supplements.  4. Discussed prognosis and to re-scan after treatment is complete.  5. Pain: Continue current narcotic regimen. XRT as above.  Approximately 30 minutes was spent in discussion of which greater than 50% was consultation.  Patient expressed understanding and was in agreement with this plan. He also understands that He can call clinic at any time with any questions, concerns, or complaints.   JRulon Abide NP  Cancer Staging Squamous cell lung cancer, right ([facility] Staging form: Lung, AJCC 8th Edition - Clinical stage from 06/19/2016: Stage IIIC (cT3, cN3, cM0) - Signed by TLloyd Huger MD on 06/19/2016   Patient was seen and evaluated independently and I agree with the assessment and plan as dictated above.  TLloyd Huger MD 07/04/16 4:22 PM

## 2016-06-30 ENCOUNTER — Inpatient Hospital Stay: Payer: Medicare Other

## 2016-06-30 ENCOUNTER — Inpatient Hospital Stay (HOSPITAL_BASED_OUTPATIENT_CLINIC_OR_DEPARTMENT_OTHER): Payer: Medicare Other | Admitting: Oncology

## 2016-06-30 VITALS — BP 117/71 | HR 66 | Temp 97.0°F | Resp 18 | Wt 180.8 lb

## 2016-06-30 VITALS — BP 110/71 | HR 59 | Resp 18

## 2016-06-30 DIAGNOSIS — C3412 Malignant neoplasm of upper lobe, left bronchus or lung: Secondary | ICD-10-CM | POA: Diagnosis not present

## 2016-06-30 DIAGNOSIS — Z79899 Other long term (current) drug therapy: Secondary | ICD-10-CM

## 2016-06-30 DIAGNOSIS — I998 Other disorder of circulatory system: Secondary | ICD-10-CM

## 2016-06-30 DIAGNOSIS — Z806 Family history of leukemia: Secondary | ICD-10-CM

## 2016-06-30 DIAGNOSIS — Z87442 Personal history of urinary calculi: Secondary | ICD-10-CM

## 2016-06-30 DIAGNOSIS — C3491 Malignant neoplasm of unspecified part of right bronchus or lung: Secondary | ICD-10-CM

## 2016-06-30 DIAGNOSIS — Z87891 Personal history of nicotine dependence: Secondary | ICD-10-CM

## 2016-06-30 DIAGNOSIS — M2550 Pain in unspecified joint: Secondary | ICD-10-CM

## 2016-06-30 DIAGNOSIS — I700 Atherosclerosis of aorta: Secondary | ICD-10-CM | POA: Diagnosis not present

## 2016-06-30 DIAGNOSIS — I517 Cardiomegaly: Secondary | ICD-10-CM

## 2016-06-30 DIAGNOSIS — R590 Localized enlarged lymph nodes: Secondary | ICD-10-CM

## 2016-06-30 DIAGNOSIS — K5730 Diverticulosis of large intestine without perforation or abscess without bleeding: Secondary | ICD-10-CM

## 2016-06-30 DIAGNOSIS — I2510 Atherosclerotic heart disease of native coronary artery without angina pectoris: Secondary | ICD-10-CM

## 2016-06-30 DIAGNOSIS — M25512 Pain in left shoulder: Secondary | ICD-10-CM | POA: Diagnosis not present

## 2016-06-30 DIAGNOSIS — R634 Abnormal weight loss: Secondary | ICD-10-CM

## 2016-06-30 LAB — CBC WITH DIFFERENTIAL/PLATELET
Basophils Absolute: 0.1 10*3/uL (ref 0–0.1)
Basophils Relative: 1 %
Eosinophils Absolute: 0.3 10*3/uL (ref 0–0.7)
Eosinophils Relative: 4 %
HEMATOCRIT: 34.5 % — AB (ref 40.0–52.0)
HEMOGLOBIN: 12.1 g/dL — AB (ref 13.0–18.0)
LYMPHS ABS: 0.7 10*3/uL — AB (ref 1.0–3.6)
Lymphocytes Relative: 9 %
MCH: 31.8 pg (ref 26.0–34.0)
MCHC: 35 g/dL (ref 32.0–36.0)
MCV: 91 fL (ref 80.0–100.0)
MONOS PCT: 17 %
Monocytes Absolute: 1.2 10*3/uL — ABNORMAL HIGH (ref 0.2–1.0)
NEUTROS ABS: 5.1 10*3/uL (ref 1.4–6.5)
NEUTROS PCT: 69 %
Platelets: 184 10*3/uL (ref 150–440)
RBC: 3.79 MIL/uL — AB (ref 4.40–5.90)
RDW: 12.8 % (ref 11.5–14.5)
WBC: 7.4 10*3/uL (ref 3.8–10.6)

## 2016-06-30 LAB — COMPREHENSIVE METABOLIC PANEL
ALK PHOS: 70 U/L (ref 38–126)
ALT: 16 U/L — AB (ref 17–63)
AST: 20 U/L (ref 15–41)
Albumin: 3.2 g/dL — ABNORMAL LOW (ref 3.5–5.0)
Anion gap: 6 (ref 5–15)
BUN: 20 mg/dL (ref 6–20)
CALCIUM: 9 mg/dL (ref 8.9–10.3)
CO2: 28 mmol/L (ref 22–32)
Chloride: 99 mmol/L — ABNORMAL LOW (ref 101–111)
Creatinine, Ser: 0.78 mg/dL (ref 0.61–1.24)
GFR calc non Af Amer: >60 mL/min (ref >60)
GLUCOSE: 107 mg/dL — AB (ref 65–99)
Potassium: 3.6 mmol/L (ref 3.5–5.1)
SODIUM: 133 mmol/L — AB (ref 135–145)
Total Bilirubin: 0.8 mg/dL (ref 0.3–1.2)
Total Protein: 6.9 g/dL (ref 6.5–8.1)

## 2016-06-30 MED ORDER — PALONOSETRON HCL INJECTION 0.25 MG/5ML
0.2500 mg | Freq: Once | INTRAVENOUS | Status: AC
  Administered 2016-06-30: 0.25 mg via INTRAVENOUS
  Filled 2016-06-30: qty 5

## 2016-06-30 MED ORDER — HEPARIN SOD (PORK) LOCK FLUSH 100 UNIT/ML IV SOLN
500.0000 [IU] | Freq: Once | INTRAVENOUS | Status: AC
  Administered 2016-06-30: 500 [IU] via INTRAVENOUS
  Filled 2016-06-30: qty 5

## 2016-06-30 MED ORDER — SODIUM CHLORIDE 0.9 % IV SOLN: 10.0000 mg | Freq: Once | INTRAVENOUS | Status: DC

## 2016-06-30 MED ORDER — SODIUM CHLORIDE 0.9% FLUSH
10.0000 mL | INTRAVENOUS | Status: DC | PRN
  Administered 2016-06-30: 10 mL via INTRAVENOUS
  Filled 2016-06-30: qty 10

## 2016-06-30 MED ORDER — MEGESTROL ACETATE 40 MG PO TABS: 40.0000 mg | ORAL_TABLET | Freq: Every day | ORAL | 1 refills | Status: DC

## 2016-06-30 MED ORDER — CARBOPLATIN CHEMO INJECTION 450 MG/45ML: 198.0000 mg | Freq: Once | INTRAVENOUS | Status: DC

## 2016-06-30 MED ORDER — DIPHENHYDRAMINE HCL 50 MG/ML IJ SOLN
25.0000 mg | Freq: Once | INTRAMUSCULAR | Status: AC
  Administered 2016-06-30: 25 mg via INTRAVENOUS
  Filled 2016-06-30: qty 1

## 2016-06-30 MED ORDER — SODIUM CHLORIDE 0.9% FLUSH
10.0000 mL | INTRAVENOUS | Status: DC | PRN
  Filled 2016-06-30: qty 10

## 2016-06-30 MED ORDER — SODIUM CHLORIDE 0.9 % IV SOLN
45.0000 mg/m2 | Freq: Once | INTRAVENOUS | Status: AC
  Administered 2016-06-30: 96 mg via INTRAVENOUS
  Filled 2016-06-30: qty 16

## 2016-06-30 MED ORDER — HEPARIN SOD (PORK) LOCK FLUSH 100 UNIT/ML IV SOLN: 500.0000 [IU] | Freq: Once | INTRAVENOUS | Status: DC | PRN

## 2016-06-30 MED ORDER — DEXAMETHASONE SODIUM PHOSPHATE 10 MG/ML IJ SOLN
10.0000 mg | Freq: Once | INTRAMUSCULAR | Status: AC
  Administered 2016-06-30: 10 mg via INTRAVENOUS
  Filled 2016-06-30: qty 3
  Filled 2016-06-30: qty 1

## 2016-06-30 MED ORDER — SODIUM CHLORIDE 0.9 % IV SOLN
200.0000 mg | Freq: Once | INTRAVENOUS | Status: AC
  Administered 2016-06-30: 200 mg via INTRAVENOUS
  Filled 2016-06-30: qty 20

## 2016-06-30 MED ORDER — FAMOTIDINE IN NACL 20-0.9 MG/50ML-% IV SOLN
20.0000 mg | Freq: Once | INTRAVENOUS | Status: AC
  Administered 2016-06-30: 20 mg via INTRAVENOUS
  Filled 2016-06-30: qty 50

## 2016-06-30 MED ORDER — SODIUM CHLORIDE 0.9 % IV SOLN
Freq: Once | INTRAVENOUS | Status: AC
  Administered 2016-06-30: 10:00:00 via INTRAVENOUS
  Filled 2016-06-30: qty 1000

## 2016-06-30 NOTE — Progress Notes (Signed)
States is feeling sleepy this morning from taking a percocet. Requests if can get lab work drawn at [place] [facility] for Dr. Jefm Bryant. Also would like prescription for steroid for appetite stimulant.

## 2016-07-01 DIAGNOSIS — C3412 Malignant neoplasm of upper lobe, left bronchus or lung: Secondary | ICD-10-CM | POA: Diagnosis not present

## 2016-07-03 DIAGNOSIS — C3412 Malignant neoplasm of upper lobe, left bronchus or lung: Secondary | ICD-10-CM | POA: Diagnosis not present

## 2016-07-04 ENCOUNTER — Ambulatory Visit
Admission: RE | Admit: 2016-07-04 | Discharge: 2016-07-04 | Disposition: A | Discharge status: Home / Self Care | Payer: Medicare Other | Source: Ambulatory Visit | Attending: Radiation Oncology | Admitting: Radiation Oncology

## 2016-07-04 DIAGNOSIS — C3412 Malignant neoplasm of upper lobe, left bronchus or lung: Secondary | ICD-10-CM | POA: Diagnosis not present

## 2016-07-06 NOTE — Progress Notes (Signed)
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  INTERVAL HISTORY: Patient returns to clinic today for cycle 2 Carbo/taxol. He continues to have left shoulder pain. Appetite is much better. He tried taking Megace for two days but did not notice a significant difference so has not continued it. He has not tried any protein supplements such as Ensure. Since treatment last week, he has noticed increased forgetfulness. This has been very concerning for him. He also has noticed an increase in urinary frequency without burning. He admits to drinking a lot of caffeine throughout the day and before bedtime such as tea and coffee. He otherwise feels well. He denies any recent fevers. He has no chest pain, shortness of breath, cough, or hemoptysis. He denies any nausea, vomiting, constipation, or diarrhea. He has no urinary complaints. Patient offers no further specific complaints today.  REVIEW OF SYSTEMS:   Review of Systems  Constitutional: Negative for fever and malaise/fatigue.  Respiratory: Negative.  Negative for cough, hemoptysis and shortness of breath.   Cardiovascular: Negative.  Negative for chest pain and leg swelling.  Gastrointestinal: Negative.  Negative for abdominal pain.  Genitourinary: Negative.  Negative for frequency.  Musculoskeletal: Positive for joint pain.  Neurological: Positive for headaches. Negative for sensory change and weakness.       Increase forgetfulness  Psychiatric/Behavioral: Negative.  The patient is not nervous/anxious.     As per HPI. Otherwise, a complete review of systems is negative.  PAST MEDICAL HISTORY: Past Medical History:  Diagnosis Date  . Cancer ([place])    lung  . Cataract   . Collagen  vascular disease ([place])   . History of kidney stones   . Kidney stones 2014    PAST SURGICAL HISTORY: Past Surgical History:  Procedure Laterality Date  . CYSTOSCOPY W/ URETEROSCOPY W/ LITHOTRIPSY    . ENDOBRONCHIAL ULTRASOUND N/A 06/12/2016   Procedure: ENDOBRONCHIAL ULTRASOUND;  Surgeon: Laverle Hobby, MD;  Location: [facility] ORS;  Service: Pulmonary;  Laterality: N/A;  . PORTA CATH INSERTION N/A 06/26/2016   Procedure: Glori Luis Cath Insertion;  Surgeon: Algernon Huxley, MD;  Location: [place] [facility];  Service: Cardiovascular;  Laterality: N/A;  . TONSILLECTOMY      FAMILY HISTORY: Family History  Problem Relation Age of Onset  . Heart disease Mother   . Heart disease Father   . Cancer Sister 6    luekemia    ADVANCED DIRECTIVES (Y/N):  N  HEALTH MAINTENANCE: Social History  Substance Use Topics  . Smoking status: Former Smoker    Packs/day: 0.50    Years: 55.00    Quit date: 03/28/2016  . Smokeless tobacco: Former Systems developer  . Alcohol use No     Comment: Not lately     Colonoscopy:  PAP:  Bone density:  Lipid panel:  No Known Allergies  Current Outpatient Prescriptions  Medication Sig Dispense Refill  . acetaminophen (TYLENOL) 325 MG tablet Take 2 tablets (650 mg total) by mouth once. 2 tablet 0  . acetaminophen (TYLENOL) 500 MG tablet Take 500 mg by mouth every 8 (eight) hours as needed for mild pain or headache. On hold for procedure    . folic acid (FOLVITE) 1 MG tablet Take 1 mg by mouth daily.     Marland Kitchen  lidocaine-prilocaine (EMLA) cream Apply to affected area once 30 g 3  . megestrol (MEGACE) 40 MG tablet Take 1 tablet (40 mg total) by mouth daily. 30 tablet 1  . methotrexate (RHEUMATREX) 2.5 MG tablet Take 20 mg by mouth once a week. Wednesday    . naproxen sodium (ANAPROX) 220 MG tablet Take 220 mg by mouth 2 (two) times daily with a meal.    . ondansetron (ZOFRAN) 8 MG tablet Take 1 tablet (8 mg total) by mouth 2 (two) times daily as needed for refractory nausea  / vomiting. 60 tablet 2  . oxyCODONE-acetaminophen (PERCOCET/ROXICET) 5-325 MG tablet Take 1-2 tablets by mouth every 6 (six) hours as needed for severe pain. 60 tablet 0  . prochlorperazine (COMPAZINE) 10 MG tablet Take 1 tablet (10 mg total) by mouth every 6 (six) hours as needed (Nausea or vomiting). 60 tablet 2  . tamsulosin (FLOMAX) 0.4 MG CAPS capsule Take 0.4 mg by mouth daily.      No current facility-administered medications for this visit.    Facility-Administered Medications Ordered in Other Visits  Medication Dose Route Frequency Provider Last Rate Last Dose  . 0.9 %  sodium chloride infusion   Intravenous Once Lloyd Huger, MD      . acetaminophen (TYLENOL) tablet 650 mg  650 mg Oral Once Jacquelin Hawking, NP      . CARBOplatin (PARAPLATIN) 200 mg in sodium chloride 0.9 % 100 mL chemo infusion  200 mg Intravenous Once Lloyd Huger, MD      . dexamethasone (DECADRON) 10 mg in sodium chloride 0.9 % 50 mL IVPB  10 mg Intravenous Once Lloyd Huger, MD      . diphenhydrAMINE (BENADRYL) injection 25 mg  25 mg Intravenous Once Lloyd Huger, MD      . famotidine (PEPCID) IVPB 20 mg premix  20 mg Intravenous Once Lloyd Huger, MD      . heparin lock flush 100 unit/mL  500 Units Intracatheter Once PRN Lloyd Huger, MD      . PACLitaxel (TAXOL) 96 mg in dextrose 5 % 250 mL chemo infusion (</= '80mg'$ /m2)  45 mg/m2 (Treatment Plan Recorded) Intravenous Once Lloyd Huger, MD      . palonosetron (ALOXI) injection 0.25 mg  0.25 mg Intravenous Once Lloyd Huger, MD        OBJECTIVE: Vitals:   07/07/16 1036  BP: 108/67  Pulse: (!) 59  Resp: 18  Temp: 98.2 F (36.8 C)     Body mass index is 24.63 kg/m.    ECOG FS:0 - Asymptomatic  General: Well-developed, well-nourished, no acute distress. Eyes: Pink conjunctiva, anicteric sclera. Lungs: Clear to auscultation bilaterally. Heart: Regular rate and rhythm. No rubs, murmurs, or gallops. Abdomen:  Soft, nontender, nondistended. No organomegaly noted, normoactive bowel sounds. Musculoskeletal: No edema, cyanosis, or clubbing. Neuro: Alert, answering all questions appropriately. Cranial nerves grossly intact. Skin: No rashes or petechiae noted. Psych: Normal affect.  LAB RESULTS:  Lab Results  Component Value Date   NA 132 (L) 07/07/2016   K 4.4 07/07/2016   CL 102 07/07/2016   CO2 26 07/07/2016   GLUCOSE 107 (H) 07/07/2016   BUN 21 (H) 07/07/2016   CREATININE 0.70 07/07/2016   CALCIUM 8.7 (L) 07/07/2016   PROT 7.0 07/07/2016   ALBUMIN 3.2 (L) 07/07/2016   AST 20 07/07/2016   ALT 26 07/07/2016   ALKPHOS 63 07/07/2016   BILITOT 0.7 07/07/2016   GFRNONAA >60 07/07/2016  GFRAA >60 07/07/2016    Lab Results  Component Value Date   WBC 7.0 07/07/2016   NEUTROABS 5.7 07/07/2016   HGB 11.6 (L) 07/07/2016   HCT 33.6 (L) 07/07/2016   MCV 91.2 07/07/2016   PLT 195 07/07/2016     STUDIES: Dg Chest 1 View  Result Date: 06/12/2016 CLINICAL DATA:  Bronchoscopy. EXAM: CHEST 1 VIEW COMPARISON:  PET-CT 05/20/2016 . FINDINGS: Left upper lung mass lesion noted. Right lung is clear. Cardiomegaly with normal pulmonary vascularity. Small left pneumothorax noted. IMPRESSION: Small left pneumothorax noted. Left upper lobe mass lesion again noted. Critical Value/emergent results were called by telephone at the time of interpretation on 06/12/2016 at 2:24 pm to [facility] verbally acknowledged these results. Electronically Signed   By: Marcello Moores  Register   On: 06/12/2016 14:27   Dg Chest 2 View  Result Date: 06/13/2016 CLINICAL DATA:  Left upper lobe mass, status post bronchoscopy EXAM: CHEST  2 VIEW COMPARISON:  06/12/2016 FINDINGS: Cardiac shadow is within normal limits. Large left upper lobe mass lesion with associated daughter lesion are again seen and stable. The left lung again shows a small apical pneumothorax. The apical component has increased slightly in the interval although the lateral  component has decreased. No bony abnormality is seen. IMPRESSION: Relatively stable left lung pneumothorax. Stable left upper lobe mass lesion and daughter lesion. Electronically Signed   By: Inez Catalina M.D.   On: 06/13/2016 11:00   Mr Jeri Cos IE Contrast  Result Date: 06/19/2016 CLINICAL DATA:  76 y/o  M; initial staging of lung cancer. EXAM: MRI HEAD WITHOUT AND WITH CONTRAST TECHNIQUE: Multiplanar, multiecho pulse sequences of the brain and surrounding structures were obtained without and with intravenous contrast. CONTRAST:  65m MULTIHANCE GADOBENATE DIMEGLUMINE 529 MG/ML IV SOLN COMPARISON:  05/20/2016 PET-CT.  04/10/2012 CT head. FINDINGS: Brain: T2 FLAIR hyperintense foci within subcortical and periventricular white matter are compatible with mild chronic microvascular ischemic changes for age. Mild brain parenchymal volume loss. No diffusion signal abnormality. No abnormal susceptibility hypointensity to indicate intracranial hemorrhage. No hydrocephalus or extra-axial collection. No effacement of basilar cisterns. After administration of intravenous contrast there is no abnormal enhancement of the brain. Vascular: Normal flow voids. Skull and upper cervical spine: Normal marrow signal. Sinuses/Orbits: Mild anterior ethmoid and maxillary sinus mucosal thickening. No abnormal signal of mastoid air cells. Bilateral intra-ocular lens replacement. Other: None. IMPRESSION: 1. No intracranial metastatic disease is identified. 2. Mild chronic microvascular ischemic changes and mild parenchymal volume loss of the brain for age. Electronically Signed   By: LKristine GarbeM.D.   On: 06/19/2016 14:29   Dg C-arm 1-60 Min-no Report  Result Date: 06/12/2016 Fluoroscopy was utilized by the requesting physician.  No radiographic interpretation.    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  PLAN:    1. Cycle 2 Carbo/Taxol today. Labs okay for  treatment. 2. XRT begins Tues 3/27.  3. Weight loss: Weight stable today. Up 1 lb.  Scheduled to see Jolie with dietary today. Continue Megace 40 mg daily given.  4. Forgetfulness: Dementia???. Continue to monitor.  5. Pain: Continue current narcotic regimen. XRT as above. 6. Increased urinary frequency: Encouraged him to limit fluids before bedtime.  7. Hyponatremia: Na 132. Encouraged him to push fluids throughout the day. Kidney function stable.  8. RTC in one week for labs/MD assess and cycle 3 of Carbo/Taxol.    JFaythe Casa NP   Approximately 30 minutes was  spent in discussion of which greater than 50% was consultation.  Patient expressed understanding and was in agreement with this plan. He also understands that He can call clinic at any time with any questions, concerns, or complaints.    Cancer Staging Squamous cell lung cancer, right [facility]) Staging form: Lung, AJCC 8th Edition - Clinical stage from 06/19/2016: Stage IIIC (cT3, cN3, cM0) - Signed by Lloyd Huger, MD on 06/19/2016   Patient was seen and evaluated independently and I agree with the assessment and plan as dictated above.  Lloyd Huger, MD 07/07/16 11:42 AM

## 2016-07-07 ENCOUNTER — Ambulatory Visit: Payer: Medicare Other

## 2016-07-07 ENCOUNTER — Inpatient Hospital Stay (HOSPITAL_BASED_OUTPATIENT_CLINIC_OR_DEPARTMENT_OTHER): Payer: Medicare Other | Admitting: Oncology

## 2016-07-07 ENCOUNTER — Inpatient Hospital Stay: Payer: Medicare Other

## 2016-07-07 VITALS — BP 108/67 | HR 59 | Temp 98.2°F | Resp 18 | Wt 181.6 lb

## 2016-07-07 DIAGNOSIS — I517 Cardiomegaly: Secondary | ICD-10-CM

## 2016-07-07 DIAGNOSIS — C3491 Malignant neoplasm of unspecified part of right bronchus or lung: Secondary | ICD-10-CM

## 2016-07-07 DIAGNOSIS — R350 Frequency of micturition: Secondary | ICD-10-CM

## 2016-07-07 DIAGNOSIS — Z79899 Other long term (current) drug therapy: Secondary | ICD-10-CM

## 2016-07-07 DIAGNOSIS — C3412 Malignant neoplasm of upper lobe, left bronchus or lung: Secondary | ICD-10-CM

## 2016-07-07 DIAGNOSIS — Z806 Family history of leukemia: Secondary | ICD-10-CM

## 2016-07-07 DIAGNOSIS — Z87442 Personal history of urinary calculi: Secondary | ICD-10-CM

## 2016-07-07 DIAGNOSIS — R590 Localized enlarged lymph nodes: Secondary | ICD-10-CM

## 2016-07-07 DIAGNOSIS — R634 Abnormal weight loss: Secondary | ICD-10-CM

## 2016-07-07 DIAGNOSIS — I2510 Atherosclerotic heart disease of native coronary artery without angina pectoris: Secondary | ICD-10-CM

## 2016-07-07 DIAGNOSIS — E781 Pure hyperglyceridemia: Secondary | ICD-10-CM

## 2016-07-07 DIAGNOSIS — I998 Other disorder of circulatory system: Secondary | ICD-10-CM | POA: Diagnosis not present

## 2016-07-07 DIAGNOSIS — K5730 Diverticulosis of large intestine without perforation or abscess without bleeding: Secondary | ICD-10-CM

## 2016-07-07 DIAGNOSIS — I700 Atherosclerosis of aorta: Secondary | ICD-10-CM

## 2016-07-07 DIAGNOSIS — M25512 Pain in left shoulder: Secondary | ICD-10-CM | POA: Diagnosis not present

## 2016-07-07 DIAGNOSIS — Z87891 Personal history of nicotine dependence: Secondary | ICD-10-CM

## 2016-07-07 DIAGNOSIS — F413 Other mixed anxiety disorders: Secondary | ICD-10-CM

## 2016-07-07 DIAGNOSIS — M2550 Pain in unspecified joint: Secondary | ICD-10-CM

## 2016-07-07 LAB — CBC WITH DIFFERENTIAL/PLATELET
Basophils Absolute: 0.1 10*3/uL (ref 0–0.1)
Basophils Relative: 1 %
EOS ABS: 0.1 10*3/uL (ref 0–0.7)
EOS PCT: 2 %
HCT: 33.6 % — ABNORMAL LOW (ref 40.0–52.0)
Hemoglobin: 11.6 g/dL — ABNORMAL LOW (ref 13.0–18.0)
LYMPHS ABS: 0.6 10*3/uL — AB (ref 1.0–3.6)
LYMPHS PCT: 9 %
MCH: 31.6 pg (ref 26.0–34.0)
MCHC: 34.7 g/dL (ref 32.0–36.0)
MCV: 91.2 fL (ref 80.0–100.0)
MONO ABS: 0.5 10*3/uL (ref 0.2–1.0)
MONOS PCT: 7 %
Neutro Abs: 5.7 10*3/uL (ref 1.4–6.5)
Neutrophils Relative %: 81 %
PLATELETS: 195 10*3/uL (ref 150–440)
RBC: 3.68 MIL/uL — ABNORMAL LOW (ref 4.40–5.90)
RDW: 13.2 % (ref 11.5–14.5)
WBC: 7 10*3/uL (ref 3.8–10.6)

## 2016-07-07 LAB — COMPREHENSIVE METABOLIC PANEL
ALT: 26 U/L (ref 17–63)
ANION GAP: 4 — AB (ref 5–15)
AST: 20 U/L (ref 15–41)
Albumin: 3.2 g/dL — ABNORMAL LOW (ref 3.5–5.0)
Alkaline Phosphatase: 63 U/L (ref 38–126)
BUN: 21 mg/dL — ABNORMAL HIGH (ref 6–20)
CHLORIDE: 102 mmol/L (ref 101–111)
CO2: 26 mmol/L (ref 22–32)
CREATININE: 0.7 mg/dL (ref 0.61–1.24)
Calcium: 8.7 mg/dL — ABNORMAL LOW (ref 8.9–10.3)
Glucose, Bld: 107 mg/dL — ABNORMAL HIGH (ref 65–99)
POTASSIUM: 4.4 mmol/L (ref 3.5–5.1)
Sodium: 132 mmol/L — ABNORMAL LOW (ref 135–145)
TOTAL PROTEIN: 7 g/dL (ref 6.5–8.1)
Total Bilirubin: 0.7 mg/dL (ref 0.3–1.2)

## 2016-07-07 MED ORDER — DIPHENHYDRAMINE HCL 50 MG/ML IJ SOLN
25.0000 mg | Freq: Once | INTRAMUSCULAR | Status: AC
  Administered 2016-07-07: 25 mg via INTRAVENOUS
  Filled 2016-07-07: qty 1

## 2016-07-07 MED ORDER — SODIUM CHLORIDE 0.9 % IV SOLN
Freq: Once | INTRAVENOUS | Status: AC
Start: 2016-07-07 — End: 2016-07-07
  Administered 2016-07-07: 12:00:00 via INTRAVENOUS
  Filled 2016-07-07: qty 1000

## 2016-07-07 MED ORDER — SODIUM CHLORIDE 0.9 % IV SOLN: 10.0000 mg | Freq: Once | INTRAVENOUS | Status: DC

## 2016-07-07 MED ORDER — FAMOTIDINE IN NACL 20-0.9 MG/50ML-% IV SOLN
20.0000 mg | Freq: Once | INTRAVENOUS | Status: AC
  Administered 2016-07-07: 20 mg via INTRAVENOUS
  Filled 2016-07-07: qty 50

## 2016-07-07 MED ORDER — SODIUM CHLORIDE 0.9 % IV SOLN
45.0000 mg/m2 | Freq: Once | INTRAVENOUS | Status: AC
  Administered 2016-07-07: 96 mg via INTRAVENOUS
  Filled 2016-07-07: qty 16

## 2016-07-07 MED ORDER — HEPARIN SOD (PORK) LOCK FLUSH 100 UNIT/ML IV SOLN
500.0000 [IU] | Freq: Once | INTRAVENOUS | Status: AC | PRN
  Administered 2016-07-07: 500 [IU]
  Filled 2016-07-07: qty 5

## 2016-07-07 MED ORDER — SODIUM CHLORIDE 0.9 % IV SOLN
200.0000 mg | Freq: Once | INTRAVENOUS | Status: AC
  Administered 2016-07-07: 200 mg via INTRAVENOUS
  Filled 2016-07-07: qty 20

## 2016-07-07 MED ORDER — DEXAMETHASONE SODIUM PHOSPHATE 10 MG/ML IJ SOLN
10.0000 mg | Freq: Once | INTRAMUSCULAR | Status: AC
  Administered 2016-07-07: 10 mg via INTRAVENOUS
  Filled 2016-07-07: qty 1

## 2016-07-07 MED ORDER — ACETAMINOPHEN 325 MG PO TABS
650.0000 mg | ORAL_TABLET | Freq: Once | ORAL | Status: AC
  Administered 2016-07-07: 650 mg via ORAL
  Filled 2016-07-07: qty 2

## 2016-07-07 MED ORDER — PALONOSETRON HCL INJECTION 0.25 MG/5ML
0.2500 mg | Freq: Once | INTRAVENOUS | Status: AC
  Administered 2016-07-07: 0.25 mg via INTRAVENOUS
  Filled 2016-07-07: qty 5

## 2016-07-07 MED ORDER — ACETAMINOPHEN 325 MG PO TABS: 650.0000 mg | ORAL_TABLET | Freq: Once | ORAL | 0 refills | Status: AC

## 2016-07-07 NOTE — Progress Notes (Signed)
Nutrition Assessment   Reason for Assessment:   Poor appetite  ASSESSMENT:  76 year old male with new diagnosis of stage III lung cancer.  Patient receiving chemotherapy and radiation therapy.  Past medical history of kidney stones, RA.  Patient seen in infusion suite with grand-daughter at chairside.  Patient reports that he thinks he has a good appetite.  Took megace for 2 days but stopped taking it due to not making much difference.  Patient reports that he typically has a biscuit for breakfast, snacks or has sandwich for lunch and last night for dinner had chicken and dumplings, vegetable and dessert.  Patient reports that he has to take care of wife as she is disabled.  Patient became tearful while talking about her.  Grand-daughter reports she lives next door and family and friends have been preparing meals and bringing them to patient and wife.  Patient does not really like the ensure/boost shakes.    No trouble chewing, swallowing.  Did have some problems with constipation but this has resolved.    Nutrition Focused Physical Exam: deferred  Medications: folic acid, megace, zofran, compazine  Labs: Na 132, glucoe 107, BUN 21, creatinine WDL  Anthropometrics:   Height: 72 inches Weight: 181 lb 9.6 oz UBW: 230 lb in June of 2017 21% weight loss in the last 9 months, significant  2% weight loss in the last 18 days  BMI: 24   Estimated Energy Needs  Kcals: 0100-7121 calories/d Protein: 98-123 g/d Fluid: 2.8 L/d  NUTRITION DIAGNOSIS: Unintentional weight loss related to cancer as evidenced by 21% weight loss in the last 9 months, significant   MALNUTRITION DIAGNOSIS: Patient meets criteria for severe malnutrition in context of chronic illness as evidenced by 21% weight loss in 9 months and eating < or equal to 75% estimated energy needs in > or equal to 1 month   INTERVENTION:   Discussed strategies to increase calories and protein. Fact sheet provided.   Encouraged  eating q 2-3 hours Discussed difference between oral nutrition supplements and samples provided along with coupons.  Grand-daughter asking about ways to handle taste changes and nausea and vomiting if patient experiences any of that.  Provided fact sheets on these topics for grand-daughter.      MONITORING, EVALUATION, GOAL: Patient will consume adequate calories and protein to maintain lean muscle mass and prevent further weight loss   NEXT VISIT: as needed  Ramone Gander B. Zenia Resides, [place], [place] Registered Dietitian [phone] (pager)

## 2016-07-07 NOTE — Progress Notes (Signed)
Complains of headache this morning from not eating breakfast. Feeling well otherwise. Pt requests tylenol while getting treatment today for his headache.

## 2016-07-08 ENCOUNTER — Ambulatory Visit
Admission: RE | Admit: 2016-07-08 | Discharge: 2016-07-08 | Disposition: A | Discharge status: Home / Self Care | Payer: Medicare Other | Source: Ambulatory Visit | Attending: Radiation Oncology | Admitting: Radiation Oncology

## 2016-07-08 DIAGNOSIS — C3412 Malignant neoplasm of upper lobe, left bronchus or lung: Secondary | ICD-10-CM | POA: Diagnosis not present

## 2016-07-09 ENCOUNTER — Ambulatory Visit
Admission: RE | Admit: 2016-07-09 | Discharge: 2016-07-09 | Disposition: A | Discharge status: Home / Self Care | Payer: Medicare Other | Source: Ambulatory Visit | Attending: Radiation Oncology | Admitting: Radiation Oncology

## 2016-07-09 DIAGNOSIS — C3412 Malignant neoplasm of upper lobe, left bronchus or lung: Secondary | ICD-10-CM | POA: Diagnosis not present

## 2016-07-09 LAB — CYTOLOGY - NON PAP

## 2016-07-10 ENCOUNTER — Ambulatory Visit
Admission: RE | Admit: 2016-07-10 | Discharge: 2016-07-10 | Disposition: A | Discharge status: Home / Self Care | Payer: Medicare Other | Source: Ambulatory Visit | Attending: Radiation Oncology | Admitting: Radiation Oncology

## 2016-07-10 DIAGNOSIS — C3412 Malignant neoplasm of upper lobe, left bronchus or lung: Secondary | ICD-10-CM | POA: Diagnosis not present

## 2016-07-11 ENCOUNTER — Ambulatory Visit
Admission: RE | Admit: 2016-07-11 | Discharge: 2016-07-11 | Disposition: A | Discharge status: Home / Self Care | Payer: Medicare Other | Source: Ambulatory Visit | Attending: Radiation Oncology | Admitting: Radiation Oncology

## 2016-07-11 DIAGNOSIS — C3412 Malignant neoplasm of upper lobe, left bronchus or lung: Secondary | ICD-10-CM | POA: Diagnosis not present

## 2016-07-13 NOTE — Progress Notes (Signed)
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  INTERVAL HISTORY: Patient returns to clinic today for consideration of cycle 3 of weekly carboplatinum and Taxol. He is having bilateral calf pain. He also feels he is alternating between constipation and diarrhea. He otherwise feels well. He denies any recent fevers. He has no chest pain, shortness of breath, cough, or hemoptysis. He denies any nausea or vomiting. He has no urinary complaints. Patient offers no further specific complaints today.  REVIEW OF SYSTEMS:   Review of Systems  Constitutional: Negative for fever and malaise/fatigue.  Respiratory: Negative.  Negative for cough, hemoptysis and shortness of breath.   Cardiovascular: Negative.  Negative for chest pain and leg swelling.  Gastrointestinal: Positive for blood in stool and constipation. Negative for abdominal pain, nausea and vomiting.  Genitourinary: Negative.  Negative for frequency.  Musculoskeletal: Positive for joint pain and myalgias.  Neurological: Positive for headaches. Negative for sensory change and weakness.       Increase forgetfulness  Psychiatric/Behavioral: Positive for memory loss. The patient is not nervous/anxious.     As per HPI. Otherwise, a complete review of systems is negative.  PAST MEDICAL HISTORY: Past Medical History:  Diagnosis Date  . Cancer ([place])    lung  . Cataract   . Collagen vascular disease ([place])   . History of kidney stones   . Kidney stones 2014    PAST SURGICAL HISTORY: Past Surgical History:  Procedure Laterality Date  . CYSTOSCOPY W/ URETEROSCOPY W/ LITHOTRIPSY    . ENDOBRONCHIAL ULTRASOUND N/A 06/12/2016   Procedure: ENDOBRONCHIAL ULTRASOUND;  Surgeon: Laverle Hobby, MD;  Location: [facility] ORS;  Service: Pulmonary;  Laterality: N/A;  . PORTA CATH INSERTION N/A 06/26/2016   Procedure: Glori Luis Cath Insertion;  Surgeon: Algernon Huxley, MD;  Location: [place] [facility];  Service: Cardiovascular;  Laterality: N/A;  . TONSILLECTOMY      FAMILY HISTORY: Family History  Problem Relation Age of Onset  . Heart disease Mother   . Heart disease Father   . Cancer Sister 6    luekemia    ADVANCED DIRECTIVES (Y/N):  N  HEALTH MAINTENANCE: Social History  Substance Use Topics  . Smoking status: Former Smoker    Packs/day: 0.50    Years: 55.00    Quit date: 03/28/2016  . Smokeless tobacco: Former Systems developer  . Alcohol use No     Comment: Not lately     Colonoscopy:  PAP:  Bone density:  Lipid panel:  No Known Allergies  Current Outpatient Prescriptions  Medication Sig Dispense Refill  . acetaminophen (TYLENOL) 500 MG tablet Take 500 mg by mouth every 8 (eight) hours as needed for mild pain or headache. On hold for procedure    . folic acid (FOLVITE) 1 MG tablet Take 1 mg by mouth daily.     Marland Kitchen lidocaine-prilocaine (EMLA) cream Apply to affected area once 30 g 3  . megestrol (MEGACE) 40 MG tablet Take 1 tablet (40 mg total) by mouth daily. 30 tablet 1  . methotrexate (RHEUMATREX) 2.5 MG tablet Take 20 mg by mouth once a week. Wednesday    . naproxen sodium (ANAPROX) 220 MG tablet Take 220 mg by mouth 2 (two) times daily with a meal.    .  ondansetron (ZOFRAN) 8 MG tablet Take 1 tablet (8 mg total) by mouth 2 (two) times daily as needed for refractory nausea / vomiting. 60 tablet 2  . oxyCODONE-acetaminophen (PERCOCET/ROXICET) 5-325 MG tablet Take 1-2 tablets by mouth every 6 (six) hours as needed for severe pain. 60 tablet 0  . prochlorperazine (COMPAZINE) 10 MG tablet Take 1 tablet (10 mg total) by mouth every 6 (six) hours as needed (Nausea or vomiting). 60 tablet 2  . tamsulosin (FLOMAX) 0.4 MG CAPS capsule Take 0.4 mg by mouth daily.      No  current facility-administered medications for this visit.     OBJECTIVE: Vitals:   07/14/16 0902  BP: 102/65  Pulse: 71  Resp: 18  Temp: 98.3 F (36.8 C)     Body mass index is 24.7 kg/m.    ECOG FS:0 - Asymptomatic  General: Well-developed, well-nourished, no acute distress. Eyes: Pink conjunctiva, anicteric sclera. Lungs: Clear to auscultation bilaterally. Heart: Regular rate and rhythm. No rubs, murmurs, or gallops. Abdomen: Soft, nontender, nondistended. No organomegaly noted, normoactive bowel sounds. Musculoskeletal: No edema, cyanosis, or clubbing. Neuro: Alert, answering all questions appropriately. Cranial nerves grossly intact. Skin: No rashes or petechiae noted. Psych: Normal affect.  LAB RESULTS:  Lab Results  Component Value Date   NA 136 07/14/2016   K 3.8 07/14/2016   CL 106 07/14/2016   CO2 25 07/14/2016   GLUCOSE 117 (H) 07/14/2016   BUN 20 07/14/2016   CREATININE 0.67 07/14/2016   CALCIUM 8.7 (L) 07/14/2016   PROT 6.7 07/14/2016   ALBUMIN 3.2 (L) 07/14/2016   AST 19 07/14/2016   ALT 18 07/14/2016   ALKPHOS 55 07/14/2016   BILITOT 0.9 07/14/2016   GFRNONAA >60 07/14/2016   GFRAA >60 07/14/2016    Lab Results  Component Value Date   WBC 4.1 07/14/2016   NEUTROABS 3.4 07/14/2016   HGB 11.0 (L) 07/14/2016   HCT 31.2 (L) 07/14/2016   MCV 89.7 07/14/2016   PLT 152 07/14/2016     STUDIES: Mr Jeri Cos BJ Contrast  Result Date: 06/19/2016 CLINICAL DATA:  76 y/o  M; initial staging of lung cancer. EXAM: MRI HEAD WITHOUT AND WITH CONTRAST TECHNIQUE: Multiplanar, multiecho pulse sequences of the brain and surrounding structures were obtained without and with intravenous contrast. CONTRAST:  90m MULTIHANCE GADOBENATE DIMEGLUMINE 529 MG/ML IV SOLN COMPARISON:  05/20/2016 PET-CT.  04/10/2012 CT head. FINDINGS: Brain: T2 FLAIR hyperintense foci within subcortical and periventricular white matter are compatible with mild chronic microvascular ischemic  changes for age. Mild brain parenchymal volume loss. No diffusion signal abnormality. No abnormal susceptibility hypointensity to indicate intracranial hemorrhage. No hydrocephalus or extra-axial collection. No effacement of basilar cisterns. After administration of intravenous contrast there is no abnormal enhancement of the brain. Vascular: Normal flow voids. Skull and upper cervical spine: Normal marrow signal. Sinuses/Orbits: Mild anterior ethmoid and maxillary sinus mucosal thickening. No abnormal signal of mastoid air cells. Bilateral intra-ocular lens replacement. Other: None. IMPRESSION: 1. No intracranial metastatic disease is identified. 2. Mild chronic microvascular ischemic changes and mild parenchymal volume loss of the brain for age. Electronically Signed   By: LKristine GarbeM.D.   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  PLAN:    1. 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 MRI the brain  is negative. Continue daily XRT. Proceed with cycle 3 of weekly carboplatinum and Taxol today.  If patient tolerates his initial treatment, will consider 2 consolidation doses of chemotherapy. Return to clinic in 1 week for reevaluation and consideration of cycle 4. 2. Pain: Continue current narcotic regimen. XRT as above. 3. Poor appetite/weight loss: Appreciate dietary input. Continue Megace as needed. 4. 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 Hyponatremia: Resolved. 6. Constipation/diarrhea: Patient was recommended to try a stool softener once a day in attempt for more regular bowel movements.  Patient expressed understanding and was in agreement with this plan. He also understands that He can call clinic at any time with any questions, concerns, or complaints.    Cancer  Staging Squamous cell lung cancer, right [facility]) Staging form: Lung, AJCC 8th Edition - Clinical stage from 06/19/2016: Stage IIIC (cT3, cN3, cM0) - Signed by Lloyd Huger, MD on 06/19/2016    Lloyd Huger, MD 07/16/16 1:35 PM

## 2016-07-14 ENCOUNTER — Inpatient Hospital Stay: Payer: Medicare Other

## 2016-07-14 ENCOUNTER — Encounter: Payer: Self-pay | Admitting: Oncology

## 2016-07-14 ENCOUNTER — Inpatient Hospital Stay (HOSPITAL_BASED_OUTPATIENT_CLINIC_OR_DEPARTMENT_OTHER): Payer: Medicare Other | Admitting: Oncology

## 2016-07-14 ENCOUNTER — Inpatient Hospital Stay: Payer: Medicare Other | Attending: Oncology

## 2016-07-14 ENCOUNTER — Ambulatory Visit
Admission: RE | Admit: 2016-07-14 | Discharge: 2016-07-14 | Disposition: A | Discharge status: Home / Self Care | Payer: Medicare Other | Source: Ambulatory Visit | Attending: Radiation Oncology | Admitting: Radiation Oncology

## 2016-07-14 VITALS — BP 102/65 | HR 71 | Temp 98.3°F | Resp 18 | Wt 182.1 lb

## 2016-07-14 DIAGNOSIS — R630 Anorexia: Secondary | ICD-10-CM

## 2016-07-14 DIAGNOSIS — M79661 Pain in right lower leg: Secondary | ICD-10-CM | POA: Insufficient documentation

## 2016-07-14 DIAGNOSIS — C3491 Malignant neoplasm of unspecified part of right bronchus or lung: Secondary | ICD-10-CM

## 2016-07-14 DIAGNOSIS — D6959 Other secondary thrombocytopenia: Secondary | ICD-10-CM | POA: Insufficient documentation

## 2016-07-14 DIAGNOSIS — R413 Other amnesia: Secondary | ICD-10-CM | POA: Diagnosis not present

## 2016-07-14 DIAGNOSIS — Z806 Family history of leukemia: Secondary | ICD-10-CM | POA: Diagnosis not present

## 2016-07-14 DIAGNOSIS — Z87442 Personal history of urinary calculi: Secondary | ICD-10-CM

## 2016-07-14 DIAGNOSIS — Z79899 Other long term (current) drug therapy: Secondary | ICD-10-CM | POA: Insufficient documentation

## 2016-07-14 DIAGNOSIS — R197 Diarrhea, unspecified: Secondary | ICD-10-CM

## 2016-07-14 DIAGNOSIS — D6481 Anemia due to antineoplastic chemotherapy: Secondary | ICD-10-CM | POA: Diagnosis not present

## 2016-07-14 DIAGNOSIS — F419 Anxiety disorder, unspecified: Secondary | ICD-10-CM | POA: Insufficient documentation

## 2016-07-14 DIAGNOSIS — M79662 Pain in left lower leg: Secondary | ICD-10-CM | POA: Diagnosis not present

## 2016-07-14 DIAGNOSIS — Z87891 Personal history of nicotine dependence: Secondary | ICD-10-CM

## 2016-07-14 DIAGNOSIS — M791 Myalgia: Secondary | ICD-10-CM

## 2016-07-14 DIAGNOSIS — R634 Abnormal weight loss: Secondary | ICD-10-CM | POA: Insufficient documentation

## 2016-07-14 DIAGNOSIS — M2550 Pain in unspecified joint: Secondary | ICD-10-CM | POA: Diagnosis not present

## 2016-07-14 DIAGNOSIS — R21 Rash and other nonspecific skin eruption: Secondary | ICD-10-CM | POA: Insufficient documentation

## 2016-07-14 DIAGNOSIS — D701 Agranulocytosis secondary to cancer chemotherapy: Secondary | ICD-10-CM | POA: Diagnosis not present

## 2016-07-14 DIAGNOSIS — K5900 Constipation, unspecified: Secondary | ICD-10-CM | POA: Insufficient documentation

## 2016-07-14 DIAGNOSIS — C3411 Malignant neoplasm of upper lobe, right bronchus or lung: Secondary | ICD-10-CM

## 2016-07-14 DIAGNOSIS — C3412 Malignant neoplasm of upper lobe, left bronchus or lung: Secondary | ICD-10-CM | POA: Insufficient documentation

## 2016-07-14 DIAGNOSIS — Z5111 Encounter for antineoplastic chemotherapy: Secondary | ICD-10-CM | POA: Diagnosis not present

## 2016-07-14 LAB — CBC WITH DIFFERENTIAL/PLATELET
BASOS ABS: 0 10*3/uL (ref 0–0.1)
BASOS PCT: 1 %
Eosinophils Absolute: 0 10*3/uL (ref 0–0.7)
Eosinophils Relative: 1 %
HEMATOCRIT: 31.2 % — AB (ref 40.0–52.0)
Hemoglobin: 11 g/dL — ABNORMAL LOW (ref 13.0–18.0)
LYMPHS PCT: 10 %
Lymphs Abs: 0.4 10*3/uL — ABNORMAL LOW (ref 1.0–3.6)
MCH: 31.5 pg (ref 26.0–34.0)
MCHC: 35.1 g/dL (ref 32.0–36.0)
MCV: 89.7 fL (ref 80.0–100.0)
MONO ABS: 0.3 10*3/uL (ref 0.2–1.0)
Monocytes Relative: 7 %
NEUTROS ABS: 3.4 10*3/uL (ref 1.4–6.5)
NEUTROS PCT: 81 %
Platelets: 152 10*3/uL (ref 150–440)
RBC: 3.48 MIL/uL — AB (ref 4.40–5.90)
RDW: 13.1 % (ref 11.5–14.5)
WBC: 4.1 10*3/uL (ref 3.8–10.6)

## 2016-07-14 LAB — COMPREHENSIVE METABOLIC PANEL
ALBUMIN: 3.2 g/dL — AB (ref 3.5–5.0)
ALT: 18 U/L (ref 17–63)
AST: 19 U/L (ref 15–41)
Alkaline Phosphatase: 55 U/L (ref 38–126)
Anion gap: 5 (ref 5–15)
BILIRUBIN TOTAL: 0.9 mg/dL (ref 0.3–1.2)
BUN: 20 mg/dL (ref 6–20)
CHLORIDE: 106 mmol/L (ref 101–111)
CO2: 25 mmol/L (ref 22–32)
CREATININE: 0.67 mg/dL (ref 0.61–1.24)
Calcium: 8.7 mg/dL — ABNORMAL LOW (ref 8.9–10.3)
GFR calc Af Amer: >60 mL/min (ref >60)
GFR calc non Af Amer: >60 mL/min (ref >60)
Glucose, Bld: 117 mg/dL — ABNORMAL HIGH (ref 65–99)
POTASSIUM: 3.8 mmol/L (ref 3.5–5.1)
Sodium: 136 mmol/L (ref 135–145)
TOTAL PROTEIN: 6.7 g/dL (ref 6.5–8.1)

## 2016-07-14 MED ORDER — SODIUM CHLORIDE 0.9 % IV SOLN
Freq: Once | INTRAVENOUS | Status: AC
  Administered 2016-07-14: 10:00:00 via INTRAVENOUS
  Filled 2016-07-14: qty 1000

## 2016-07-14 MED ORDER — DIPHENHYDRAMINE HCL 50 MG/ML IJ SOLN
25.0000 mg | Freq: Once | INTRAMUSCULAR | Status: AC
  Administered 2016-07-14: 25 mg via INTRAVENOUS
  Filled 2016-07-14: qty 1

## 2016-07-14 MED ORDER — DEXAMETHASONE SODIUM PHOSPHATE 10 MG/ML IJ SOLN
10.0000 mg | Freq: Once | INTRAMUSCULAR | Status: AC
  Administered 2016-07-14: 10 mg via INTRAVENOUS
  Filled 2016-07-14: qty 1

## 2016-07-14 MED ORDER — SODIUM CHLORIDE 0.9 % IV SOLN
198.0000 mg | Freq: Once | INTRAVENOUS | Status: AC
  Administered 2016-07-14: 200 mg via INTRAVENOUS
  Filled 2016-07-14: qty 20

## 2016-07-14 MED ORDER — HEPARIN SOD (PORK) LOCK FLUSH 100 UNIT/ML IV SOLN
500.0000 [IU] | Freq: Once | INTRAVENOUS | Status: AC | PRN
  Administered 2016-07-14: 500 [IU]
  Filled 2016-07-14: qty 5

## 2016-07-14 MED ORDER — SODIUM CHLORIDE 0.9 % IV SOLN: 10.0000 mg | Freq: Once | INTRAVENOUS | Status: DC

## 2016-07-14 MED ORDER — SODIUM CHLORIDE 0.9% FLUSH
10.0000 mL | INTRAVENOUS | Status: DC | PRN
  Administered 2016-07-14: 10 mL
  Filled 2016-07-14: qty 10

## 2016-07-14 MED ORDER — FAMOTIDINE IN NACL 20-0.9 MG/50ML-% IV SOLN
20.0000 mg | Freq: Once | INTRAVENOUS | Status: AC
  Administered 2016-07-14: 20 mg via INTRAVENOUS
  Filled 2016-07-14: qty 50

## 2016-07-14 MED ORDER — PALONOSETRON HCL INJECTION 0.25 MG/5ML
0.2500 mg | Freq: Once | INTRAVENOUS | Status: AC
  Administered 2016-07-14: 0.25 mg via INTRAVENOUS
  Filled 2016-07-14: qty 5

## 2016-07-14 MED ORDER — SODIUM CHLORIDE 0.9 % IV SOLN
45.0000 mg/m2 | Freq: Once | INTRAVENOUS | Status: AC
  Administered 2016-07-14: 96 mg via INTRAVENOUS
  Filled 2016-07-14: qty 16

## 2016-07-14 NOTE — Progress Notes (Signed)
Complains of constipation followed by diarrhea. Having neuropathy in both legs that is unrelieved with current pain medication. Pt takes miralax and alternates with milk of mag which would cause diarrhea. Instructed pt to try stool softener once a day to help prevent constipation.

## 2016-07-15 ENCOUNTER — Ambulatory Visit
Admission: RE | Admit: 2016-07-15 | Discharge: 2016-07-15 | Disposition: A | Discharge status: Home / Self Care | Payer: Medicare Other | Source: Ambulatory Visit | Attending: Radiation Oncology | Admitting: Radiation Oncology

## 2016-07-15 DIAGNOSIS — C3412 Malignant neoplasm of upper lobe, left bronchus or lung: Secondary | ICD-10-CM | POA: Diagnosis not present

## 2016-07-16 ENCOUNTER — Ambulatory Visit
Admission: RE | Admit: 2016-07-16 | Discharge: 2016-07-16 | Disposition: A | Discharge status: Home / Self Care | Payer: Medicare Other | Source: Ambulatory Visit | Attending: Radiation Oncology | Admitting: Radiation Oncology

## 2016-07-16 DIAGNOSIS — C3412 Malignant neoplasm of upper lobe, left bronchus or lung: Secondary | ICD-10-CM | POA: Diagnosis not present

## 2016-07-17 ENCOUNTER — Ambulatory Visit
Admission: RE | Admit: 2016-07-17 | Discharge: 2016-07-17 | Disposition: A | Discharge status: Home / Self Care | Payer: Medicare Other | Source: Ambulatory Visit | Attending: Radiation Oncology | Admitting: Radiation Oncology

## 2016-07-17 DIAGNOSIS — C3412 Malignant neoplasm of upper lobe, left bronchus or lung: Secondary | ICD-10-CM | POA: Diagnosis not present

## 2016-07-18 ENCOUNTER — Ambulatory Visit
Admission: RE | Admit: 2016-07-18 | Discharge: 2016-07-18 | Disposition: A | Discharge status: Home / Self Care | Payer: Medicare Other | Source: Ambulatory Visit | Attending: Radiation Oncology | Admitting: Radiation Oncology

## 2016-07-18 DIAGNOSIS — C3412 Malignant neoplasm of upper lobe, left bronchus or lung: Secondary | ICD-10-CM | POA: Diagnosis not present

## 2016-07-20 NOTE — Progress Notes (Signed)
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  INTERVAL HISTORY: Patient returns to clinic today for consideration of cycle 4 of weekly carboplatinum and Taxol. He has a pruritic rash on his arms and torso, but otherwise feels well. He does not complain of weakness or fatigue today. He denies any recent fevers. He has no chest pain, shortness of breath, cough, or hemoptysis. He denies any nausea or vomiting. He has no urinary complaints. Patient offers no further specific complaints today.  REVIEW OF SYSTEMS:   Review of Systems  Constitutional: Negative for fever and malaise/fatigue.  Respiratory: Negative.  Negative for cough, hemoptysis and shortness of breath.   Cardiovascular: Negative.  Negative for chest pain and leg swelling.  Gastrointestinal: Positive for constipation. Negative for abdominal pain, blood in stool, nausea and vomiting.  Genitourinary: Negative.  Negative for frequency.  Musculoskeletal: Positive for joint pain. Negative for myalgias.  Skin: Positive for rash.  Neurological: Negative.  Negative for sensory change, weakness and headaches.       Increase forgetfulness  Psychiatric/Behavioral: Positive for memory loss. The patient is nervous/anxious.     As per HPI. Otherwise, a complete review of systems is negative.  PAST MEDICAL HISTORY: Past Medical History:  Diagnosis Date  . Cancer ([place])    lung  . Cataract   . Collagen vascular disease ([place])   . History of kidney stones   . Kidney stones 2014    PAST SURGICAL HISTORY: Past Surgical History:  Procedure Laterality Date  . CYSTOSCOPY W/ URETEROSCOPY W/ LITHOTRIPSY    . ENDOBRONCHIAL ULTRASOUND N/A 06/12/2016   Procedure: ENDOBRONCHIAL  ULTRASOUND;  Surgeon: Laverle Hobby, MD;  Location: [facility] ORS;  Service: Pulmonary;  Laterality: N/A;  . PORTA CATH INSERTION N/A 06/26/2016   Procedure: Glori Luis Cath Insertion;  Surgeon: Algernon Huxley, MD;  Location: [place] [facility];  Service: Cardiovascular;  Laterality: N/A;  . TONSILLECTOMY      FAMILY HISTORY: Family History  Problem Relation Age of Onset  . Heart disease Mother   . Heart disease Father   . Cancer Sister 6    luekemia    ADVANCED DIRECTIVES (Y/N):  N  HEALTH MAINTENANCE: Social History  Substance Use Topics  . Smoking status: Former Smoker    Packs/day: 0.50    Years: 55.00    Quit date: 03/28/2016  . Smokeless tobacco: Former Systems developer  . Alcohol use No     Comment: Not lately     Colonoscopy:  PAP:  Bone density:  Lipid panel:  No Known Allergies  Current Outpatient Prescriptions  Medication Sig Dispense Refill  . acetaminophen (TYLENOL) 500 MG tablet Take 500 mg by mouth every 8 (eight) hours as needed for mild pain or headache. On hold for procedure    . folic acid (FOLVITE) 1 MG tablet Take 1 mg by mouth daily.     Marland Kitchen lidocaine-prilocaine (EMLA) cream Apply to affected area once 30 g 3  . megestrol (MEGACE) 40 MG tablet Take 1 tablet (40 mg total) by mouth daily. 30 tablet 1  . methotrexate (RHEUMATREX) 2.5 MG tablet Take 20 mg by mouth once a week. Wednesday    . naproxen sodium (ANAPROX) 220 MG tablet Take 220 mg by mouth 2 (two) times  daily with a meal.    . ondansetron (ZOFRAN) 8 MG tablet Take 1 tablet (8 mg total) by mouth 2 (two) times daily as needed for refractory nausea / vomiting. 60 tablet 2  . oxyCODONE-acetaminophen (PERCOCET/ROXICET) 5-325 MG tablet Take 1-2 tablets by mouth every 6 (six) hours as needed for severe pain. 60 tablet 0  . prochlorperazine (COMPAZINE) 10 MG tablet Take 1 tablet (10 mg total) by mouth every 6 (six) hours as needed (Nausea or vomiting). 60 tablet 2  . tamsulosin (FLOMAX) 0.4 MG CAPS capsule Take 0.4  mg by mouth daily.     . methylPREDNISolone (MEDROL DOSEPAK) 4 MG TBPK tablet Day one take 6 tablets, Day two take 5 tablets, day three take 4 tablets, day four take 3 tablets, day five take 2 tablets day six take 1 tablet. 21 tablet 0  . sucralfate (CARAFATE) 1 g tablet Take 1 tablet (1 g total) by mouth 3 (three) times daily. Dissolve in 2-3 TBSP warm water, swish and swallow. 90 tablet 3   No current facility-administered medications for this visit.     OBJECTIVE: Vitals:   07/21/16 1203  BP: 121/70  Pulse: 76  Temp: 97 F (36.1 C)     There is no height or weight on file to calculate BMI.    ECOG FS:0 - Asymptomatic  General: Well-developed, well-nourished, no acute distress. Eyes: Pink conjunctiva, anicteric sclera. Lungs: Clear to auscultation bilaterally. Heart: Regular rate and rhythm. No rubs, murmurs, or gallops. Abdomen: Soft, nontender, nondistended. No organomegaly noted, normoactive bowel sounds. Musculoskeletal: Maculopapular rash noted on upper extremity and on torso. Neuro: Alert, answering all questions appropriately. Cranial nerves grossly intact. Skin: No rashes or petechiae noted. Psych: Normal affect.  LAB RESULTS:  Lab Results  Component Value Date   NA 132 (L) 07/21/2016   K 3.5 07/21/2016   CL 103 07/21/2016   CO2 24 07/21/2016   GLUCOSE 119 (H) 07/21/2016   BUN 18 07/21/2016   CREATININE 0.70 07/21/2016   CALCIUM 8.4 (L) 07/21/2016   PROT 6.8 07/21/2016   ALBUMIN 3.3 (L) 07/21/2016   AST 17 07/21/2016   ALT 19 07/21/2016   ALKPHOS 61 07/21/2016   BILITOT 0.8 07/21/2016   GFRNONAA >60 07/21/2016   GFRAA >60 07/21/2016    Lab Results  Component Value Date   WBC 3.2 (L) 07/21/2016   NEUTROABS 2.6 07/21/2016   HGB 11.0 (L) 07/21/2016   HCT 31.8 (L) 07/21/2016   MCV 90.9 07/21/2016   PLT 136 (L) 07/21/2016     STUDIES: No results found.  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  PLAN:      1. 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 MRI the brain is negative. Continue daily XRT, completing on August 06, 2016. Proceed with cycle 4 of weekly carboplatinum and Taxol today.  If patient tolerates his initial treatment, Can consider maintenance treatment with Durvalumab starting approximately 6 weeks after treatment every 2 weeks for total of one year. Return to clinic in 1 week for consideration of cycle 5. 2. Pain: Continue current narcotic regimen. XRT as above. 3. Poor appetite/weight loss: Appreciate dietary input. Continue Megace as needed. 4. 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 Hyponatremia: Mild, monitor. 6. Constipation/diarrhea: Patient was recommended to try a stool softener once a day in attempt for more  regular bowel movements. 7. Rash: Unclear etiology. Patient was given a Medrol Dosepak.  Patient expressed understanding and was in agreement with this plan. He also understands that He can call clinic at any time with any questions, concerns, or complaints.    Cancer Staging Squamous cell lung cancer, right [facility]) Staging form: Lung, AJCC 8th Edition - Clinical stage from 06/19/2016: Stage IIIC (cT3, cN3, cM0) - Signed by Lloyd Huger, MD on 06/19/2016    Lloyd Huger, MD 07/27/16 11:23 AM

## 2016-07-21 ENCOUNTER — Inpatient Hospital Stay (HOSPITAL_BASED_OUTPATIENT_CLINIC_OR_DEPARTMENT_OTHER): Payer: Medicare Other | Admitting: Oncology

## 2016-07-21 ENCOUNTER — Inpatient Hospital Stay: Payer: Medicare Other

## 2016-07-21 ENCOUNTER — Telehealth: Payer: Self-pay | Admitting: *Deleted

## 2016-07-21 ENCOUNTER — Ambulatory Visit
Admission: RE | Admit: 2016-07-21 | Discharge: 2016-07-21 | Disposition: A | Discharge status: Home / Self Care | Payer: Medicare Other | Source: Ambulatory Visit | Attending: Radiation Oncology | Admitting: Radiation Oncology

## 2016-07-21 ENCOUNTER — Ambulatory Visit: Payer: Medicare Other | Admitting: Internal Medicine

## 2016-07-21 ENCOUNTER — Other Ambulatory Visit: Payer: Self-pay | Admitting: *Deleted

## 2016-07-21 VITALS — BP 121/70 | HR 76 | Temp 97.0°F

## 2016-07-21 DIAGNOSIS — K5900 Constipation, unspecified: Secondary | ICD-10-CM | POA: Diagnosis not present

## 2016-07-21 DIAGNOSIS — M79661 Pain in right lower leg: Secondary | ICD-10-CM | POA: Diagnosis not present

## 2016-07-21 DIAGNOSIS — R413 Other amnesia: Secondary | ICD-10-CM

## 2016-07-21 DIAGNOSIS — R197 Diarrhea, unspecified: Secondary | ICD-10-CM | POA: Diagnosis not present

## 2016-07-21 DIAGNOSIS — R21 Rash and other nonspecific skin eruption: Secondary | ICD-10-CM

## 2016-07-21 DIAGNOSIS — C3411 Malignant neoplasm of upper lobe, right bronchus or lung: Secondary | ICD-10-CM

## 2016-07-21 DIAGNOSIS — Z87442 Personal history of urinary calculi: Secondary | ICD-10-CM | POA: Diagnosis not present

## 2016-07-21 DIAGNOSIS — M791 Myalgia: Secondary | ICD-10-CM | POA: Diagnosis not present

## 2016-07-21 DIAGNOSIS — R630 Anorexia: Secondary | ICD-10-CM

## 2016-07-21 DIAGNOSIS — Z806 Family history of leukemia: Secondary | ICD-10-CM

## 2016-07-21 DIAGNOSIS — C3412 Malignant neoplasm of upper lobe, left bronchus or lung: Secondary | ICD-10-CM | POA: Diagnosis not present

## 2016-07-21 DIAGNOSIS — M2550 Pain in unspecified joint: Secondary | ICD-10-CM

## 2016-07-21 DIAGNOSIS — M79662 Pain in left lower leg: Secondary | ICD-10-CM | POA: Diagnosis not present

## 2016-07-21 DIAGNOSIS — Z87891 Personal history of nicotine dependence: Secondary | ICD-10-CM

## 2016-07-21 DIAGNOSIS — R634 Abnormal weight loss: Secondary | ICD-10-CM | POA: Diagnosis not present

## 2016-07-21 DIAGNOSIS — C3491 Malignant neoplasm of unspecified part of right bronchus or lung: Secondary | ICD-10-CM

## 2016-07-21 DIAGNOSIS — Z79899 Other long term (current) drug therapy: Secondary | ICD-10-CM

## 2016-07-21 LAB — CBC WITH DIFFERENTIAL/PLATELET
BASOS ABS: 0 10*3/uL (ref 0–0.1)
BASOS PCT: 1 %
Eosinophils Absolute: 0 10*3/uL (ref 0–0.7)
Eosinophils Relative: 1 %
HEMATOCRIT: 31.8 % — AB (ref 40.0–52.0)
HEMOGLOBIN: 11 g/dL — AB (ref 13.0–18.0)
LYMPHS PCT: 9 %
Lymphs Abs: 0.3 10*3/uL — ABNORMAL LOW (ref 1.0–3.6)
MCH: 31.4 pg (ref 26.0–34.0)
MCHC: 34.6 g/dL (ref 32.0–36.0)
MCV: 90.9 fL (ref 80.0–100.0)
Monocytes Absolute: 0.2 10*3/uL (ref 0.2–1.0)
Monocytes Relative: 8 %
NEUTROS ABS: 2.6 10*3/uL (ref 1.4–6.5)
NEUTROS PCT: 81 %
Platelets: 136 10*3/uL — ABNORMAL LOW (ref 150–440)
RBC: 3.5 MIL/uL — AB (ref 4.40–5.90)
RDW: 13.8 % (ref 11.5–14.5)
WBC: 3.2 10*3/uL — AB (ref 3.8–10.6)

## 2016-07-21 LAB — COMPREHENSIVE METABOLIC PANEL
ALBUMIN: 3.3 g/dL — AB (ref 3.5–5.0)
ALT: 19 U/L (ref 17–63)
AST: 17 U/L (ref 15–41)
Alkaline Phosphatase: 61 U/L (ref 38–126)
Anion gap: 5 (ref 5–15)
BILIRUBIN TOTAL: 0.8 mg/dL (ref 0.3–1.2)
BUN: 18 mg/dL (ref 6–20)
CO2: 24 mmol/L (ref 22–32)
Calcium: 8.4 mg/dL — ABNORMAL LOW (ref 8.9–10.3)
Chloride: 103 mmol/L (ref 101–111)
Creatinine, Ser: 0.7 mg/dL (ref 0.61–1.24)
GFR calc Af Amer: >60 mL/min (ref >60)
GFR calc non Af Amer: >60 mL/min (ref >60)
GLUCOSE: 119 mg/dL — AB (ref 65–99)
Potassium: 3.5 mmol/L (ref 3.5–5.1)
Sodium: 132 mmol/L — ABNORMAL LOW (ref 135–145)
Total Protein: 6.8 g/dL (ref 6.5–8.1)

## 2016-07-21 MED ORDER — METHYLPREDNISOLONE 4 MG PO TBPK: ORAL_TABLET | ORAL | 0 refills | Status: DC

## 2016-07-21 NOTE — Progress Notes (Signed)
Patient here for treatment check. Has diffuse rash on chest and arms. Patient states it is very itchy.

## 2016-07-21 NOTE — Telephone Encounter (Signed)
Pharamcy did not get Rx that was to be sent in today

## 2016-07-22 ENCOUNTER — Other Ambulatory Visit: Payer: Self-pay

## 2016-07-22 ENCOUNTER — Ambulatory Visit
Admission: RE | Admit: 2016-07-22 | Discharge: 2016-07-22 | Disposition: A | Discharge status: Home / Self Care | Payer: Medicare Other | Source: Ambulatory Visit | Attending: Radiation Oncology | Admitting: Radiation Oncology

## 2016-07-22 ENCOUNTER — Inpatient Hospital Stay: Payer: Medicare Other

## 2016-07-22 ENCOUNTER — Other Ambulatory Visit: Payer: Self-pay | Admitting: *Deleted

## 2016-07-22 VITALS — BP 124/75 | HR 59 | Temp 96.0°F | Resp 18

## 2016-07-22 DIAGNOSIS — C3491 Malignant neoplasm of unspecified part of right bronchus or lung: Secondary | ICD-10-CM

## 2016-07-22 DIAGNOSIS — C3412 Malignant neoplasm of upper lobe, left bronchus or lung: Secondary | ICD-10-CM | POA: Diagnosis not present

## 2016-07-22 LAB — SEDIMENTATION RATE: Sed Rate: 54 mm/hr — ABNORMAL HIGH (ref 0–20)

## 2016-07-22 MED ORDER — SUCRALFATE 1 G PO TABS: 1.0000 g | ORAL_TABLET | Freq: Three times a day (TID) | ORAL | 3 refills | Status: DC

## 2016-07-22 MED ORDER — SODIUM CHLORIDE 0.9 % IV SOLN
198.0000 mg | Freq: Once | INTRAVENOUS | Status: AC
  Administered 2016-07-22: 200 mg via INTRAVENOUS
  Filled 2016-07-22: qty 20

## 2016-07-22 MED ORDER — SODIUM CHLORIDE 0.9% FLUSH
10.0000 mL | INTRAVENOUS | Status: DC | PRN
  Administered 2016-07-22: 10 mL
  Filled 2016-07-22: qty 10

## 2016-07-22 MED ORDER — FAMOTIDINE IN NACL 20-0.9 MG/50ML-% IV SOLN
20.0000 mg | Freq: Once | INTRAVENOUS | Status: AC
  Administered 2016-07-22: 20 mg via INTRAVENOUS
  Filled 2016-07-22: qty 50

## 2016-07-22 MED ORDER — HEPARIN SOD (PORK) LOCK FLUSH 100 UNIT/ML IV SOLN
500.0000 [IU] | Freq: Once | INTRAVENOUS | Status: AC | PRN
  Administered 2016-07-22: 500 [IU]
  Filled 2016-07-22: qty 5

## 2016-07-22 MED ORDER — SODIUM CHLORIDE 0.9 % IV SOLN
45.0000 mg/m2 | Freq: Once | INTRAVENOUS | Status: AC
  Administered 2016-07-22: 96 mg via INTRAVENOUS
  Filled 2016-07-22: qty 16

## 2016-07-22 MED ORDER — PALONOSETRON HCL INJECTION 0.25 MG/5ML
0.2500 mg | Freq: Once | INTRAVENOUS | Status: AC
  Administered 2016-07-22: 0.25 mg via INTRAVENOUS
  Filled 2016-07-22: qty 5

## 2016-07-22 MED ORDER — DEXAMETHASONE SODIUM PHOSPHATE 10 MG/ML IJ SOLN
10.0000 mg | Freq: Once | INTRAMUSCULAR | Status: AC
  Administered 2016-07-22: 10 mg via INTRAVENOUS
  Filled 2016-07-22: qty 1

## 2016-07-22 MED ORDER — DIPHENHYDRAMINE HCL 50 MG/ML IJ SOLN
25.0000 mg | Freq: Once | INTRAMUSCULAR | Status: AC
  Administered 2016-07-22: 25 mg via INTRAVENOUS
  Filled 2016-07-22: qty 1

## 2016-07-22 MED ORDER — DEXAMETHASONE SODIUM PHOSPHATE 100 MG/10ML IJ SOLN: 10.0000 mg | Freq: Once | INTRAMUSCULAR | Status: DC

## 2016-07-22 MED ORDER — SODIUM CHLORIDE 0.9 % IV SOLN
Freq: Once | INTRAVENOUS | Status: AC
  Administered 2016-07-22: 10:00:00 via INTRAVENOUS
  Filled 2016-07-22: qty 1000

## 2016-07-23 ENCOUNTER — Ambulatory Visit
Admission: RE | Admit: 2016-07-23 | Discharge: 2016-07-23 | Disposition: A | Discharge status: Home / Self Care | Payer: Medicare Other | Source: Ambulatory Visit | Attending: Radiation Oncology | Admitting: Radiation Oncology

## 2016-07-23 DIAGNOSIS — C3412 Malignant neoplasm of upper lobe, left bronchus or lung: Secondary | ICD-10-CM | POA: Diagnosis not present

## 2016-07-24 ENCOUNTER — Ambulatory Visit
Admission: RE | Admit: 2016-07-24 | Discharge: 2016-07-24 | Disposition: A | Discharge status: Home / Self Care | Payer: Medicare Other | Source: Ambulatory Visit | Attending: Radiation Oncology | Admitting: Radiation Oncology

## 2016-07-24 DIAGNOSIS — C3412 Malignant neoplasm of upper lobe, left bronchus or lung: Secondary | ICD-10-CM | POA: Diagnosis not present

## 2016-07-25 ENCOUNTER — Ambulatory Visit
Admission: RE | Admit: 2016-07-25 | Discharge: 2016-07-25 | Disposition: A | Discharge status: Home / Self Care | Payer: Medicare Other | Source: Ambulatory Visit | Attending: Radiation Oncology | Admitting: Radiation Oncology

## 2016-07-25 DIAGNOSIS — C3412 Malignant neoplasm of upper lobe, left bronchus or lung: Secondary | ICD-10-CM | POA: Diagnosis not present

## 2016-07-26 LAB — ACID FAST CULTURE WITH REFLEXED SENSITIVITIES: ACID FAST CULTURE - AFSCU3: NEGATIVE

## 2016-07-26 LAB — ACID FAST CULTURE WITH REFLEXED SENSITIVITIES (MYCOBACTERIA)

## 2016-07-27 NOTE — Progress Notes (Signed)
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  INTERVAL HISTORY: Patient returns to clinic today for consideration of cycle 5 of weekly carboplatinum and Taxol. The pruritic rash on his arms and torso has now resolved with a Medrol Dosepak. He continues to be highly anxious, but otherwise feels well. He does not complain of weakness or fatigue today. He denies any recent fevers. He has no chest pain, shortness of breath, cough, or hemoptysis. He denies any nausea or vomiting. He has no urinary complaints. Patient offers no further specific complaints today.  REVIEW OF SYSTEMS:   Review of Systems  Constitutional: Negative for fever and malaise/fatigue.  Respiratory: Negative.  Negative for cough, hemoptysis and shortness of breath.   Cardiovascular: Negative.  Negative for chest pain and leg swelling.  Gastrointestinal: Positive for constipation. Negative for abdominal pain, blood in stool, nausea and vomiting.  Genitourinary: Negative.  Negative for frequency.  Musculoskeletal: Positive for joint pain. Negative for myalgias.  Skin: Negative.  Negative for rash.  Neurological: Negative.  Negative for sensory change, weakness and headaches.       Increase forgetfulness  Psychiatric/Behavioral: Positive for memory loss. The patient is nervous/anxious.     As per HPI. Otherwise, a complete review of systems is negative.  PAST MEDICAL HISTORY: Past Medical History:  Diagnosis Date  . Cancer ([place])    lung  . Cataract   . Collagen vascular disease ([place])   . History of kidney stones   . Kidney stones 2014    PAST SURGICAL HISTORY: Past Surgical History:  Procedure Laterality Date  . CYSTOSCOPY W/ URETEROSCOPY W/  LITHOTRIPSY    . ENDOBRONCHIAL ULTRASOUND N/A 06/12/2016   Procedure: ENDOBRONCHIAL ULTRASOUND;  Surgeon: Laverle Hobby, MD;  Location: [facility] OR[facility];  Service: Pulmonary;  Laterality: N/A;  . PORTA CATH INSERTION N/A 06/26/2016   Procedure: Glori Luis Cath Insertion;  Surgeon: Algernon Huxley, MD;  Location: [place] [facility];  Service: Cardiovascular;  Laterality: N/A;  . TONSILLECTOMY      FAMILY HISTORY: Family History  Problem Relation Age of Onset  . Heart disease Mother   . Heart disease Father   . Cancer Sister 6    luekemia    ADVANCED DIRECTIVES (Y/N):  N  HEALTH MAINTENANCE: Social History  Substance Use Topics  . Smoking status: Former Smoker    Packs/day: 0.50    Years: 55.00    Quit date: 03/28/2016  . Smokeless tobacco: Former Systems developer  . Alcohol use No     Comment: Not lately     Colonoscopy:  PAP:  Bone density:  Lipid panel:  No Known Allergies  Current Outpatient Prescriptions  Medication Sig Dispense Refill  . acetaminophen (TYLENOL) 500 MG tablet Take 500 mg by mouth every 8 (eight) hours as needed for mild pain or headache. On hold for procedure    . folic acid (FOLVITE) 1 MG tablet Take 1 mg by mouth daily.     Marland Kitchen lidocaine-prilocaine (EMLA) cream Apply to affected area once 30 g 3  . megestrol (MEGACE) 40 MG tablet Take 1 tablet (40 mg total) by mouth daily. 30 tablet 1  . methotrexate (RHEUMATREX) 2.5 MG tablet Take 20 mg by mouth once a week. Wednesday    . naproxen  sodium (ANAPROX) 220 MG tablet Take 220 mg by mouth 2 (two) times daily with a meal.    . ondansetron (ZOFRAN) 8 MG tablet Take 1 tablet (8 mg total) by mouth 2 (two) times daily as needed for refractory nausea / vomiting. 60 tablet 2  . oxyCODONE-acetaminophen (PERCOCET/ROXICET) 5-325 MG tablet Take 1-2 tablets by mouth every 6 (six) hours as needed for severe pain. 60 tablet 0  . prochlorperazine (COMPAZINE) 10 MG tablet Take 1 tablet (10 mg total) by mouth every 6 (six) hours as needed  (Nausea or vomiting). 60 tablet 2  . sucralfate (CARAFATE) 1 g tablet Take 1 tablet (1 g total) by mouth 3 (three) times daily. Dissolve in 2-3 TBSP warm water, swish and swallow. 90 tablet 3  . tamsulosin (FLOMAX) 0.4 MG CAPS capsule Take 0.4 mg by mouth daily.     Marland Kitchen esomeprazole (NEXIUM) 40 MG capsule Take 1 capsule (40 mg total) by mouth daily. 30 capsule 1  . lidocaine (XYLOCAINE) 2 % solution Use as directed 20 mLs in the mouth or throat as needed for mouth pain. 100 mL 0  . triamcinolone ointment (KENALOG) 0.5 % Apply 1 application topically 3 (three) times daily. 30 g 0   No current facility-administered medications for this visit.     OBJECTIVE: Vitals:   07/28/16 0932  BP: 120/71  Pulse: 67  Resp: 18  Temp: 97.6 F (36.4 C)     Body mass index is 24.43 kg/m.    ECOG FS:0 - Asymptomatic  General: Well-developed, well-nourished, no acute distress. Eyes: Pink conjunctiva, anicteric sclera. Lungs: Clear to auscultation bilaterally. Heart: Regular rate and rhythm. No rubs, murmurs, or gallops. Abdomen: Soft, nontender, nondistended. No organomegaly noted, normoactive bowel sounds. Musculoskeletal: No edema. Neuro: Alert, answering all questions appropriately. Cranial nerves grossly intact. Skin: No rashes or petechiae noted. Psych: Normal affect.  LAB RESULTS:  Lab Results  Component Value Date   NA 137 08/02/2016   K 3.9 08/02/2016   CL 105 08/02/2016   CO2 26 08/02/2016   GLUCOSE 158 (H) 08/02/2016   BUN 25 (H) 08/02/2016   CREATININE 0.86 08/02/2016   CALCIUM 8.4 (L) 08/02/2016   PROT 6.1 (L) 08/02/2016   ALBUMIN 3.2 (L) 08/02/2016   AST 25 08/02/2016   ALT 20 08/02/2016   ALKPHOS 55 08/02/2016   BILITOT 1.2 08/02/2016   GFRNONAA >60 08/02/2016   GFRAA >60 08/02/2016    Lab Results  Component Value Date   WBC 3.4 (L) 08/02/2016   NEUTROABS 3.0 08/02/2016   HGB 11.3 (L) 08/02/2016   HCT 33.5 (L) 08/02/2016   MCV 91.5 08/02/2016   PLT 91 (L) 08/02/2016      STUDIES: No results found.  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  PLAN:    1. 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 MRI the brain is negative. Continue daily XRT, completing on August 06, 2016. Proceed with cycle 5 of weekly carboplatinum and Taxol today.  If patient tolerates his initial treatment, Can consider maintenance treatment with Durvalumab starting approximately 6 weeks after treatment every 2 weeks for total of one year. Return to clinic in 1 week for consideration of cycle 6. 2. Pain: Continue current narcotic regimen. XRT as above. 3. Poor appetite/weight loss: Appreciate dietary input. Continue Megace as needed. 4. Forgetfulness: Appears to  be mild dementia 5. Hyponatremia: Resolved. 6. Constipation/diarrhea: Patient was recommended to try a stool softener once a day in attempt for more regular bowel movements. 7. Rash: Unclear etiology. Resolved with Medrol Dosepak.  Patient expressed understanding and was in agreement with this plan. He also understands that He can call clinic at any time with any questions, concerns, or complaints.    Cancer Staging Squamous cell lung cancer, right [facility]) Staging form: Lung, AJCC 8th Edition - Clinical stage from 06/19/2016: Stage IIIC (cT3, cN3, cM0) - Signed by Lloyd Huger, MD on 06/19/2016    Lloyd Huger, MD 08/03/16 6:58 AM

## 2016-07-28 ENCOUNTER — Inpatient Hospital Stay: Payer: Medicare Other

## 2016-07-28 ENCOUNTER — Inpatient Hospital Stay (HOSPITAL_BASED_OUTPATIENT_CLINIC_OR_DEPARTMENT_OTHER): Payer: Medicare Other | Admitting: Oncology

## 2016-07-28 ENCOUNTER — Ambulatory Visit
Admission: RE | Admit: 2016-07-28 | Discharge: 2016-07-28 | Disposition: A | Discharge status: Home / Self Care | Payer: Medicare Other | Source: Ambulatory Visit | Attending: Radiation Oncology | Admitting: Radiation Oncology

## 2016-07-28 VITALS — BP 120/71 | HR 67 | Temp 97.6°F | Resp 18 | Wt 180.1 lb

## 2016-07-28 DIAGNOSIS — Z79899 Other long term (current) drug therapy: Secondary | ICD-10-CM

## 2016-07-28 DIAGNOSIS — R197 Diarrhea, unspecified: Secondary | ICD-10-CM

## 2016-07-28 DIAGNOSIS — Z87891 Personal history of nicotine dependence: Secondary | ICD-10-CM | POA: Diagnosis not present

## 2016-07-28 DIAGNOSIS — C3412 Malignant neoplasm of upper lobe, left bronchus or lung: Secondary | ICD-10-CM

## 2016-07-28 DIAGNOSIS — C3491 Malignant neoplasm of unspecified part of right bronchus or lung: Secondary | ICD-10-CM

## 2016-07-28 DIAGNOSIS — M79661 Pain in right lower leg: Secondary | ICD-10-CM | POA: Diagnosis not present

## 2016-07-28 DIAGNOSIS — K5900 Constipation, unspecified: Secondary | ICD-10-CM

## 2016-07-28 DIAGNOSIS — R413 Other amnesia: Secondary | ICD-10-CM

## 2016-07-28 DIAGNOSIS — Z806 Family history of leukemia: Secondary | ICD-10-CM

## 2016-07-28 DIAGNOSIS — R634 Abnormal weight loss: Secondary | ICD-10-CM | POA: Diagnosis not present

## 2016-07-28 DIAGNOSIS — M791 Myalgia: Secondary | ICD-10-CM | POA: Diagnosis not present

## 2016-07-28 DIAGNOSIS — M79662 Pain in left lower leg: Secondary | ICD-10-CM

## 2016-07-28 DIAGNOSIS — Z87442 Personal history of urinary calculi: Secondary | ICD-10-CM | POA: Diagnosis not present

## 2016-07-28 DIAGNOSIS — R630 Anorexia: Secondary | ICD-10-CM

## 2016-07-28 DIAGNOSIS — M2550 Pain in unspecified joint: Secondary | ICD-10-CM

## 2016-07-28 LAB — CBC WITH DIFFERENTIAL/PLATELET
BASOS ABS: 0 10*3/uL (ref 0–0.1)
Basophils Relative: 1 %
EOS PCT: 1 %
Eosinophils Absolute: 0 10*3/uL (ref 0–0.7)
HCT: 31.9 % — ABNORMAL LOW (ref 40.0–52.0)
Hemoglobin: 11.2 g/dL — ABNORMAL LOW (ref 13.0–18.0)
LYMPHS PCT: 8 %
Lymphs Abs: 0.3 10*3/uL — ABNORMAL LOW (ref 1.0–3.6)
MCH: 32 pg (ref 26.0–34.0)
MCHC: 35 g/dL (ref 32.0–36.0)
MCV: 91.4 fL (ref 80.0–100.0)
Monocytes Absolute: 0.1 10*3/uL — ABNORMAL LOW (ref 0.2–1.0)
Monocytes Relative: 4 %
Neutro Abs: 2.8 10*3/uL (ref 1.4–6.5)
Neutrophils Relative %: 86 %
PLATELETS: 109 10*3/uL — AB (ref 150–440)
RBC: 3.49 MIL/uL — AB (ref 4.40–5.90)
RDW: 14.8 % — ABNORMAL HIGH (ref 11.5–14.5)
WBC: 3.3 10*3/uL — AB (ref 3.8–10.6)

## 2016-07-28 LAB — COMPREHENSIVE METABOLIC PANEL
ALBUMIN: 3.4 g/dL — AB (ref 3.5–5.0)
ALT: 27 U/L (ref 17–63)
ANION GAP: 3 — AB (ref 5–15)
AST: 23 U/L (ref 15–41)
Alkaline Phosphatase: 55 U/L (ref 38–126)
BUN: 26 mg/dL — ABNORMAL HIGH (ref 6–20)
CO2: 28 mmol/L (ref 22–32)
Calcium: 8.6 mg/dL — ABNORMAL LOW (ref 8.9–10.3)
Chloride: 105 mmol/L (ref 101–111)
Creatinine, Ser: 0.67 mg/dL (ref 0.61–1.24)
GFR calc non Af Amer: >60 mL/min (ref >60)
GLUCOSE: 126 mg/dL — AB (ref 65–99)
POTASSIUM: 3.9 mmol/L (ref 3.5–5.1)
SODIUM: 136 mmol/L (ref 135–145)
TOTAL PROTEIN: 6.3 g/dL — AB (ref 6.5–8.1)
Total Bilirubin: 1 mg/dL (ref 0.3–1.2)

## 2016-07-28 MED ORDER — FAMOTIDINE IN NACL 20-0.9 MG/50ML-% IV SOLN
20.0000 mg | Freq: Once | INTRAVENOUS | Status: AC
  Administered 2016-07-28: 20 mg via INTRAVENOUS
  Filled 2016-07-28: qty 50

## 2016-07-28 MED ORDER — HEPARIN SOD (PORK) LOCK FLUSH 100 UNIT/ML IV SOLN
500.0000 [IU] | Freq: Once | INTRAVENOUS | Status: AC | PRN
  Administered 2016-07-28: 500 [IU]
  Filled 2016-07-28: qty 5

## 2016-07-28 MED ORDER — DIPHENHYDRAMINE HCL 50 MG/ML IJ SOLN
25.0000 mg | Freq: Once | INTRAMUSCULAR | Status: AC
  Administered 2016-07-28: 25 mg via INTRAVENOUS
  Filled 2016-07-28: qty 1

## 2016-07-28 MED ORDER — CARBOPLATIN CHEMO INJECTION 450 MG/45ML
200.0000 mg | Freq: Once | INTRAVENOUS | Status: AC
  Administered 2016-07-28: 200 mg via INTRAVENOUS
  Filled 2016-07-28: qty 20

## 2016-07-28 MED ORDER — DEXAMETHASONE SODIUM PHOSPHATE 10 MG/ML IJ SOLN
10.0000 mg | Freq: Once | INTRAMUSCULAR | Status: AC
  Administered 2016-07-28: 10 mg via INTRAVENOUS
  Filled 2016-07-28: qty 1

## 2016-07-28 MED ORDER — PACLITAXEL CHEMO INJECTION 300 MG/50ML
45.0000 mg/m2 | Freq: Once | INTRAVENOUS | Status: AC
  Administered 2016-07-28: 96 mg via INTRAVENOUS
  Filled 2016-07-28: qty 16

## 2016-07-28 MED ORDER — PALONOSETRON HCL INJECTION 0.25 MG/5ML
0.2500 mg | Freq: Once | INTRAVENOUS | Status: AC
  Administered 2016-07-28: 0.25 mg via INTRAVENOUS
  Filled 2016-07-28: qty 5

## 2016-07-28 MED ORDER — SODIUM CHLORIDE 0.9 % IV SOLN
Freq: Once | INTRAVENOUS | Status: AC
  Administered 2016-07-28: 10:00:00 via INTRAVENOUS
  Filled 2016-07-28: qty 1000

## 2016-07-28 MED ORDER — SODIUM CHLORIDE 0.9 % IV SOLN: 10.0000 mg | Freq: Once | INTRAVENOUS | Status: DC

## 2016-07-28 NOTE — Progress Notes (Signed)
States had rash last week but is better today after taking steroids.

## 2016-07-29 ENCOUNTER — Ambulatory Visit
Admission: RE | Admit: 2016-07-29 | Discharge: 2016-07-29 | Disposition: A | Discharge status: Home / Self Care | Payer: Medicare Other | Source: Ambulatory Visit | Attending: Radiation Oncology | Admitting: Radiation Oncology

## 2016-07-29 DIAGNOSIS — C3412 Malignant neoplasm of upper lobe, left bronchus or lung: Secondary | ICD-10-CM | POA: Diagnosis not present

## 2016-07-30 ENCOUNTER — Ambulatory Visit
Admission: RE | Admit: 2016-07-30 | Discharge: 2016-07-30 | Disposition: A | Discharge status: Home / Self Care | Payer: Medicare Other | Source: Ambulatory Visit | Attending: Radiation Oncology | Admitting: Radiation Oncology

## 2016-07-30 DIAGNOSIS — C3412 Malignant neoplasm of upper lobe, left bronchus or lung: Secondary | ICD-10-CM | POA: Diagnosis not present

## 2016-07-31 ENCOUNTER — Ambulatory Visit
Admission: RE | Admit: 2016-07-31 | Discharge: 2016-07-31 | Disposition: A | Discharge status: Home / Self Care | Payer: Medicare Other | Source: Ambulatory Visit | Attending: Radiation Oncology | Admitting: Radiation Oncology

## 2016-07-31 DIAGNOSIS — C3412 Malignant neoplasm of upper lobe, left bronchus or lung: Secondary | ICD-10-CM | POA: Diagnosis not present

## 2016-08-01 ENCOUNTER — Ambulatory Visit
Admission: RE | Admit: 2016-08-01 | Discharge: 2016-08-01 | Disposition: A | Discharge status: Home / Self Care | Payer: Medicare Other | Source: Ambulatory Visit | Attending: Radiation Oncology | Admitting: Radiation Oncology

## 2016-08-01 DIAGNOSIS — C3412 Malignant neoplasm of upper lobe, left bronchus or lung: Secondary | ICD-10-CM | POA: Diagnosis not present

## 2016-08-02 ENCOUNTER — Emergency Department
Admission: EM | Admit: 2016-08-02 | Discharge: 2016-08-02 | Disposition: A | Discharge status: Home / Self Care | Payer: Medicare Other | Attending: Emergency Medicine | Admitting: Emergency Medicine

## 2016-08-02 ENCOUNTER — Encounter: Payer: Self-pay | Admitting: Emergency Medicine

## 2016-08-02 DIAGNOSIS — J029 Acute pharyngitis, unspecified: Secondary | ICD-10-CM | POA: Diagnosis present

## 2016-08-02 DIAGNOSIS — Z87891 Personal history of nicotine dependence: Secondary | ICD-10-CM | POA: Diagnosis not present

## 2016-08-02 DIAGNOSIS — Z79899 Other long term (current) drug therapy: Secondary | ICD-10-CM | POA: Insufficient documentation

## 2016-08-02 DIAGNOSIS — Z85118 Personal history of other malignant neoplasm of bronchus and lung: Secondary | ICD-10-CM | POA: Insufficient documentation

## 2016-08-02 DIAGNOSIS — R1310 Dysphagia, unspecified: Secondary | ICD-10-CM | POA: Diagnosis not present

## 2016-08-02 LAB — COMPREHENSIVE METABOLIC PANEL
ALT: 20 U/L (ref 17–63)
AST: 25 U/L (ref 15–41)
Albumin: 3.2 g/dL — ABNORMAL LOW (ref 3.5–5.0)
Alkaline Phosphatase: 55 U/L (ref 38–126)
Anion gap: 6 (ref 5–15)
BILIRUBIN TOTAL: 1.2 mg/dL (ref 0.3–1.2)
BUN: 25 mg/dL — ABNORMAL HIGH (ref 6–20)
CALCIUM: 8.4 mg/dL — AB (ref 8.9–10.3)
CHLORIDE: 105 mmol/L (ref 101–111)
CO2: 26 mmol/L (ref 22–32)
CREATININE: 0.86 mg/dL (ref 0.61–1.24)
Glucose, Bld: 158 mg/dL — ABNORMAL HIGH (ref 65–99)
POTASSIUM: 3.9 mmol/L (ref 3.5–5.1)
Sodium: 137 mmol/L (ref 135–145)
Total Protein: 6.1 g/dL — ABNORMAL LOW (ref 6.5–8.1)

## 2016-08-02 LAB — CBC WITH DIFFERENTIAL/PLATELET
Basophils Absolute: 0.1 10*3/uL (ref 0–0.1)
Basophils Relative: 3 %
Eosinophils Absolute: 0 10*3/uL (ref 0–0.7)
Eosinophils Relative: 0 %
HCT: 33.5 % — ABNORMAL LOW (ref 40.0–52.0)
Hemoglobin: 11.3 g/dL — ABNORMAL LOW (ref 13.0–18.0)
LYMPHS ABS: 0.2 10*3/uL — AB (ref 1.0–3.6)
LYMPHS PCT: 5 %
MCH: 30.8 pg (ref 26.0–34.0)
MCHC: 33.7 g/dL (ref 32.0–36.0)
MCV: 91.5 fL (ref 80.0–100.0)
MONO ABS: 0.1 10*3/uL — AB (ref 0.2–1.0)
Monocytes Relative: 3 %
Neutro Abs: 3 10*3/uL (ref 1.4–6.5)
Neutrophils Relative %: 89 %
PLATELETS: 91 10*3/uL — AB (ref 150–440)
RBC: 3.66 MIL/uL — ABNORMAL LOW (ref 4.40–5.90)
RDW: 15.2 % — AB (ref 11.5–14.5)
WBC: 3.4 10*3/uL — ABNORMAL LOW (ref 3.8–10.6)

## 2016-08-02 MED ORDER — SODIUM CHLORIDE 0.9 % IV SOLN
Freq: Once | INTRAVENOUS | Status: AC
  Administered 2016-08-02: 21:00:00 via INTRAVENOUS

## 2016-08-02 MED ORDER — HEPARIN SOD (PORK) LOCK FLUSH 10 UNIT/ML IV SOLN
INTRAVENOUS | Status: AC
  Administered 2016-08-02: 50 [IU]
  Filled 2016-08-02: qty 1

## 2016-08-02 MED ORDER — TRIAMCINOLONE ACETONIDE 0.5 % EX OINT
1.0000 | TOPICAL_OINTMENT | Freq: Three times a day (TID) | CUTANEOUS | 0 refills | Status: DC
Start: 2016-08-02 — End: 2016-09-13

## 2016-08-02 MED ORDER — ESOMEPRAZOLE MAGNESIUM 40 MG PO CPDR: 40.0000 mg | DELAYED_RELEASE_CAPSULE | Freq: Every day | ORAL | 1 refills | Status: AC

## 2016-08-02 MED ORDER — GI COCKTAIL ~~LOC~~
30.0000 mL | Freq: Once | ORAL | Status: AC
  Administered 2016-08-02: 30 mL via ORAL
  Filled 2016-08-02: qty 30

## 2016-08-02 MED ORDER — LIDOCAINE VISCOUS 2 % MT SOLN: 20.0000 mL | OROMUCOSAL | 0 refills | Status: DC | PRN

## 2016-08-02 NOTE — ED Provider Notes (Signed)
[facility] Emergency Department Provider Note       Time seen: ----------------------------------------- 8:24 PM on 08/02/2016 -----------------------------------------     I have reviewed the triage vital signs and the nursing notes.   HISTORY   Chief Complaint Sore Throat    HPI Frank Cowan is a 76 y.o. male who presents to the ED for done aphasia and possible dehydration. Family states she has not eating or drinking very well. Patient states he can drink okay but he has a hard time eating. He has had some nausea and is taking medications to soothe his throat and for nausea without significant improvement. Family are concerned he is dehydrated patient does not feel that he is dehydrated. He is requesting something to drink on arrival.   Past Medical History:  Diagnosis Date  . Cancer ([place])    lung  . Cataract   . Collagen vascular disease ([place])   . History of kidney stones   . Kidney stones 2014    Patient Active Problem List   Diagnosis Date Noted  . Goals of care, counseling/discussion 06/22/2016  . Squamous cell lung cancer, right ([place]) 05/14/2016    Past Surgical History:  Procedure Laterality Date  . CYSTOSCOPY W/ URETEROSCOPY W/ LITHOTRIPSY    . ENDOBRONCHIAL ULTRASOUND N/A 06/12/2016   Procedure: ENDOBRONCHIAL ULTRASOUND;  Surgeon: Laverle Hobby, MD;  Location: [facility] ORS;  Service: Pulmonary;  Laterality: N/A;  . PORTA CATH INSERTION N/A 06/26/2016   Procedure: Glori Luis Cath Insertion;  Surgeon: Algernon Huxley, MD;  Location: [place] [facility];  Service: Cardiovascular;  Laterality: N/A;  . TONSILLECTOMY      Allergies Patient has no known allergies.  Social History Social History  Substance Use Topics  . Smoking status: Former Smoker    Packs/day: 0.50    Years: 55.00    Quit date: 03/28/2016  . Smokeless tobacco: Former Systems developer  . Alcohol use No     Comment: Not lately    Review of Systems Constitutional: Negative  for fever. ENT: Positive for odynophagia Cardiovascular: Negative for chest pain. Respiratory: Negative for shortness of breath. Gastrointestinal: Negative for abdominal pain, vomiting and diarrhea. Genitourinary: Negative for dysuria. Musculoskeletal: Negative for back pain. Skin: Negative for rash. Neurological: Negative for headaches, focal weakness or numbness.  10-point ROS otherwise negative.  ____________________________________________   PHYSICAL EXAM:  VITAL SIGNS: ED Triage Vitals  Enc Vitals Group     BP 08/02/16 2002 102/62     Pulse Rate 08/02/16 2002 71     Resp 08/02/16 2002 20     Temp 08/02/16 2002 97.9 F (36.6 C)     Temp Source 08/02/16 2002 Oral     SpO2 08/02/16 2002 97 %     Weight 08/02/16 2001 181 lb (82.1 kg)     Height 08/02/16 2001 6' (1.829 m)     Head Circumference --      Peak Flow --      Pain Score 08/02/16 2001 9     Pain Loc --      Pain Edu? --      Excl. in [place]? --     Constitutional: Alert and oriented. Well appearing and in no distress. Eyes: Conjunctivae are normal. PERRL. Normal extraocular movements. ENT   Head: Normocephalic and atraumatic.   Nose: No congestion/rhinnorhea.   Mouth/Throat: Mucous membranes are moist.Posterior pharynx is normal   Neck: No stridor. No adenopathy Cardiovascular: Normal rate, regular rhythm. No murmurs, rubs, or gallops.  Respiratory: Normal respiratory effort without tachypnea nor retractions. Breath sounds are clear and equal bilaterally. No wheezes/rales/rhonchi. Gastrointestinal: Soft and nontender. Normal bowel sounds Musculoskeletal: Nontender with normal range of motion in extremities. No lower extremity tenderness nor edema. Neurologic:  Normal speech and language. No gross focal neurologic deficits are appreciated.  Skin:  Scattered rash is noted around the left scapular area in multiple dermatomes. No vesicular lesions are noted Psychiatric: Mood and affect are normal. Speech  and behavior are normal.  ____________________________________________  ED COURSE:  Pertinent labs & imaging results that were available during my care of the patient were reviewed by me and considered in my medical decision making (see chart for details). Patient presents for pain with swallowing and possible dehydration, we will assess with labs and imaging as indicated.   Procedures ____________________________________________   LABS (pertinent positives/negatives)  Labs Reviewed  CBC WITH DIFFERENTIAL/PLATELET - Abnormal; Notable for the following:       Result Value   WBC 3.4 (*)    RBC 3.66 (*)    Hemoglobin 11.3 (*)    HCT 33.5 (*)    RDW 15.2 (*)    Platelets 91 (*)    Lymphs Abs 0.2 (*)    Monocytes Absolute 0.1 (*)    All other components within normal limits  COMPREHENSIVE METABOLIC PANEL - Abnormal; Notable for the following:    Glucose, Bld 158 (*)    BUN 25 (*)    Calcium 8.4 (*)    Total Protein 6.1 (*)    Albumin 3.2 (*)    All other components within normal limits   ____________________________________________  FINAL ASSESSMENT AND PLAN  Odynophagia  Plan: Patient's labs and imaging were dictated above. Patient had presented for Pain with swallowing that is likely radiation or chemotherapy related. He has also been on steroids recently and there is possibly an acid or esophagitis component. He has been advised to stop drinking tomato juice, we'll place him on an acid as well as viscous lidocaine to swish and swallow for esophageal pain. He also has a rash on his back does not appear to be shingles and we will prescribe a strong steroid cream to apply topically. He is also being advised to take melatonin at night to sleep.   Earleen Newport, MD   Note: This note was generated in part or whole with voice recognition software. Voice recognition is usually quite accurate but there are transcription errors that can and very often do occur. I apologize  for any typographical errors that were not detected and corrected.     Earleen Newport, MD 08/02/16 2146

## 2016-08-02 NOTE — ED Notes (Signed)
ED Provider at bedside. 

## 2016-08-02 NOTE — ED Notes (Signed)
Family and patient reports received weekly chemo and radiation for cancer.  Patient reports he is able to tolerate liquids without any difficulty.  Reports solid foods cause pain and burning in throat.  Patient is able to speak in complete sentences and control own secretions without difficulty.

## 2016-08-02 NOTE — ED Notes (Signed)
Port-a-cath to right upper chest accessed with 20G power huber needed 1 inch.  Flushed without difficulty with good blood return.  Labs obtained then  Flushed with 10 ml of normal saline.

## 2016-08-02 NOTE — ED Triage Notes (Signed)
Patient is currently receiving chemo and radiation. Patient sent over by his MD for his throat burning and dehydration.

## 2016-08-03 NOTE — Progress Notes (Signed)
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  INTERVAL HISTORY: Patient returns to clinic today for consideration of cycle 6 of weekly carboplatinum and Taxol. He was in the ER this weekend for dehydration, but did not require admission. He continues to have a rash on his torso that is likely related to his XRT. He otherwise feels well. He continues to be highly anxious. He does not complain of weakness or fatigue today. He denies any recent fevers. He has no chest pain, shortness of breath, cough, or hemoptysis. He denies any further nausea or vomiting. He denies any diarrhea. He has no urinary complaints. Patient offers no further specific complaints today.  REVIEW OF SYSTEMS:   Review of Systems  Constitutional: Negative for fever and malaise/fatigue.  Respiratory: Negative.  Negative for cough, hemoptysis and shortness of breath.   Cardiovascular: Negative.  Negative for chest pain and leg swelling.  Gastrointestinal: Positive for constipation. Negative for abdominal pain, blood in stool, nausea and vomiting.  Genitourinary: Negative.  Negative for frequency.  Musculoskeletal: Positive for joint pain. Negative for myalgias.  Skin: Negative.  Negative for rash.  Neurological: Negative.  Negative for sensory change, weakness and headaches.       Increase forgetfulness  Psychiatric/Behavioral: Positive for memory loss. The patient is nervous/anxious.     As per HPI. Otherwise, a complete review of systems is negative.  PAST MEDICAL HISTORY: Past Medical History:  Diagnosis Date  . Cancer ([place])    lung  . Cataract   . Collagen vascular disease ([place])   . History of kidney stones   . Kidney stones 2014    PAST SURGICAL  HISTORY: Past Surgical History:  Procedure Laterality Date  . CYSTOSCOPY W/ URETEROSCOPY W/ LITHOTRIPSY    . ENDOBRONCHIAL ULTRASOUND N/A 06/12/2016   Procedure: ENDOBRONCHIAL ULTRASOUND;  Surgeon: Laverle Hobby, MD;  Location: [facility] ORS;  Service: Pulmonary;  Laterality: N/A;  . PORTA CATH INSERTION N/A 06/26/2016   Procedure: Glori Luis Cath Insertion;  Surgeon: Algernon Huxley, MD;  Location: [place] [facility];  Service: Cardiovascular;  Laterality: N/A;  . TONSILLECTOMY      FAMILY HISTORY: Family History  Problem Relation Age of Onset  . Heart disease Mother   . Heart disease Father   . Cancer Sister 6    luekemia    ADVANCED DIRECTIVES (Y/N):  N  HEALTH MAINTENANCE: Social History  Substance Use Topics  . Smoking status: Former Smoker    Packs/day: 0.50    Years: 55.00    Quit date: 03/28/2016  . Smokeless tobacco: Former Systems developer  . Alcohol use No     Comment: Not lately     Colonoscopy:  PAP:  Bone density:  Lipid panel:  No Known Allergies  Current Outpatient Prescriptions  Medication Sig Dispense Refill  . acetaminophen (TYLENOL) 500 MG tablet Take 500 mg by mouth every 8 (eight) hours as needed for mild pain or headache. On hold for procedure    . esomeprazole (NEXIUM) 40 MG capsule Take 1 capsule (40 mg total) by mouth daily. 30 capsule 1  . folic acid (FOLVITE) 1 MG tablet Take 1 mg by mouth daily.     Marland Kitchen lidocaine (XYLOCAINE) 2 % solution Use as directed 20 mLs in the  mouth or throat as needed for mouth pain. 100 mL 0  . lidocaine-prilocaine (EMLA) cream Apply to affected area once 30 g 3  . megestrol (MEGACE) 40 MG tablet Take 1 tablet (40 mg total) by mouth daily. 30 tablet 1  . methotrexate (RHEUMATREX) 2.5 MG tablet Take 20 mg by mouth once a week. Wednesday    . naproxen sodium (ANAPROX) 220 MG tablet Take 220 mg by mouth 2 (two) times daily with a meal.    . ondansetron (ZOFRAN) 8 MG tablet Take 1 tablet (8 mg total) by mouth 2 (two) times daily as  needed for refractory nausea / vomiting. 60 tablet 2  . oxyCODONE-acetaminophen (PERCOCET/ROXICET) 5-325 MG tablet Take 1-2 tablets by mouth every 6 (six) hours as needed for severe pain. 60 tablet 0  . prochlorperazine (COMPAZINE) 10 MG tablet Take 1 tablet (10 mg total) by mouth every 6 (six) hours as needed (Nausea or vomiting). 60 tablet 2  . sucralfate (CARAFATE) 1 g tablet Take 1 tablet (1 g total) by mouth 3 (three) times daily. Dissolve in 2-3 TBSP warm water, swish and swallow. 90 tablet 3  . tamsulosin (FLOMAX) 0.4 MG CAPS capsule Take 0.4 mg by mouth daily.     Marland Kitchen triamcinolone ointment (KENALOG) 0.5 % Apply 1 application topically 3 (three) times daily. 30 g 0   No current facility-administered medications for this visit.     OBJECTIVE: Vitals:   08/04/16 0907  BP: 112/75  Pulse: 97  Resp: 18  Temp: 97 F (36.1 C)     Body mass index is 24.98 kg/m.    ECOG FS:0 - Asymptomatic  General: Well-developed, well-nourished, no acute distress. Eyes: Pink conjunctiva, anicteric sclera. Lungs: Clear to auscultation bilaterally. Heart: Regular rate and rhythm. No rubs, murmurs, or gallops. Abdomen: Soft, nontender, nondistended. No organomegaly noted, normoactive bowel sounds. Musculoskeletal: No edema. Neuro: Alert, answering all questions appropriately. Cranial nerves grossly intact. Skin: No rashes or petechiae noted. Psych: Normal affect.  LAB RESULTS:  Lab Results  Component Value Date   NA 135 08/04/2016   K 3.9 08/04/2016   CL 105 08/04/2016   CO2 24 08/04/2016   GLUCOSE 125 (H) 08/04/2016   BUN 20 08/04/2016   CREATININE 0.81 08/04/2016   CALCIUM 8.4 (L) 08/04/2016   PROT 6.0 (L) 08/04/2016   ALBUMIN 3.2 (L) 08/04/2016   AST 21 08/04/2016   ALT 18 08/04/2016   ALKPHOS 54 08/04/2016   BILITOT 1.2 08/04/2016   GFRNONAA >60 08/04/2016   GFRAA >60 08/04/2016    Lab Results  Component Value Date   WBC 2.2 (L) 08/04/2016   NEUTROABS 1.8 08/04/2016   HGB 10.1  (L) 08/04/2016   HCT 28.3 (L) 08/04/2016   MCV 90.9 08/04/2016   PLT 75 (L) 08/04/2016     STUDIES: No results found.  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  PLAN:    1. 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 MRI the brain is negative. Continue daily XRT, completing on August 06, 2016. Given patient's pancytopenia, will discontinue weekly chemotherapy altogether at this point.  Can consider maintenance treatment with Durvalumab starting approximately 6 weeks after treatment every 2 weeks for total of one year. Return to clinic in 4 weeks for repeat laboratory work, further evaluation, an additional treatment planning. If patient has an XRT  boost in 2 or 3 weeks, will give him one or 2 additional cycles of carboplatinum and Taxol. 2. Pain: Continue current narcotic regimen. XRT as above. 3. Poor appetite/weight loss: Appreciate dietary input. Continue Megace as needed. 4. 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 Hyponatremia: Resolved. 6. Constipation/diarrhea: Patient was recommended to try a stool softener once a day in attempt for more regular bowel movements. 7. Rash: Likely related XRT. 8. Thrombocytopenia: Secondary chemotherapy. Discontinue treatment as above. 9. Neutropenia: Secondary chemotherapy. Discontinue treatment as above. 10. Anemia: Secondary to chemotherapy. Discontinue treatment as above.  Patient expressed understanding and was in agreement with this plan. He also understands that He can call clinic at any time with any questions, concerns, or complaints.    Cancer Staging Squamous cell lung cancer, right [facility]) Staging form: Lung, AJCC 8th Edition - Clinical stage from 06/19/2016: Stage IIIC (cT3, cN3, cM0) - Signed by Lloyd Huger, MD on 06/19/2016    Lloyd Huger, MD  08/04/16 10:33 AM

## 2016-08-04 ENCOUNTER — Inpatient Hospital Stay: Payer: Medicare Other

## 2016-08-04 ENCOUNTER — Inpatient Hospital Stay (HOSPITAL_BASED_OUTPATIENT_CLINIC_OR_DEPARTMENT_OTHER): Payer: Medicare Other | Admitting: Oncology

## 2016-08-04 ENCOUNTER — Ambulatory Visit
Admission: RE | Admit: 2016-08-04 | Discharge: 2016-08-04 | Disposition: A | Discharge status: Home / Self Care | Payer: Medicare Other | Source: Ambulatory Visit | Attending: Radiation Oncology | Admitting: Radiation Oncology

## 2016-08-04 VITALS — BP 112/75 | HR 97 | Temp 97.0°F | Resp 18 | Wt 184.2 lb

## 2016-08-04 DIAGNOSIS — D701 Agranulocytosis secondary to cancer chemotherapy: Secondary | ICD-10-CM

## 2016-08-04 DIAGNOSIS — R634 Abnormal weight loss: Secondary | ICD-10-CM | POA: Diagnosis not present

## 2016-08-04 DIAGNOSIS — F419 Anxiety disorder, unspecified: Secondary | ICD-10-CM

## 2016-08-04 DIAGNOSIS — M79662 Pain in left lower leg: Secondary | ICD-10-CM

## 2016-08-04 DIAGNOSIS — C3491 Malignant neoplasm of unspecified part of right bronchus or lung: Secondary | ICD-10-CM

## 2016-08-04 DIAGNOSIS — M791 Myalgia: Secondary | ICD-10-CM

## 2016-08-04 DIAGNOSIS — Z87442 Personal history of urinary calculi: Secondary | ICD-10-CM

## 2016-08-04 DIAGNOSIS — D6481 Anemia due to antineoplastic chemotherapy: Secondary | ICD-10-CM

## 2016-08-04 DIAGNOSIS — R413 Other amnesia: Secondary | ICD-10-CM | POA: Diagnosis not present

## 2016-08-04 DIAGNOSIS — Z79899 Other long term (current) drug therapy: Secondary | ICD-10-CM

## 2016-08-04 DIAGNOSIS — D6959 Other secondary thrombocytopenia: Secondary | ICD-10-CM

## 2016-08-04 DIAGNOSIS — C3412 Malignant neoplasm of upper lobe, left bronchus or lung: Secondary | ICD-10-CM | POA: Diagnosis not present

## 2016-08-04 DIAGNOSIS — Z87891 Personal history of nicotine dependence: Secondary | ICD-10-CM

## 2016-08-04 DIAGNOSIS — Z806 Family history of leukemia: Secondary | ICD-10-CM

## 2016-08-04 DIAGNOSIS — M2550 Pain in unspecified joint: Secondary | ICD-10-CM

## 2016-08-04 DIAGNOSIS — R630 Anorexia: Secondary | ICD-10-CM | POA: Diagnosis not present

## 2016-08-04 DIAGNOSIS — M79661 Pain in right lower leg: Secondary | ICD-10-CM

## 2016-08-04 LAB — CBC WITH DIFFERENTIAL/PLATELET
BASOS PCT: 3 %
Basophils Absolute: 0.1 10*3/uL (ref 0–0.1)
Eosinophils Absolute: 0 10*3/uL (ref 0–0.7)
Eosinophils Relative: 1 %
HEMATOCRIT: 28.3 % — AB (ref 40.0–52.0)
HEMOGLOBIN: 10.1 g/dL — AB (ref 13.0–18.0)
LYMPHS ABS: 0.2 10*3/uL — AB (ref 1.0–3.6)
LYMPHS PCT: 8 %
MCH: 32.4 pg (ref 26.0–34.0)
MCHC: 35.6 g/dL (ref 32.0–36.0)
MCV: 90.9 fL (ref 80.0–100.0)
MONO ABS: 0.1 10*3/uL — AB (ref 0.2–1.0)
MONOS PCT: 3 %
NEUTROS ABS: 1.8 10*3/uL (ref 1.4–6.5)
NEUTROS PCT: 85 %
PLATELETS: 75 10*3/uL — AB (ref 150–440)
RBC: 3.12 MIL/uL — ABNORMAL LOW (ref 4.40–5.90)
RDW: 15.2 % — ABNORMAL HIGH (ref 11.5–14.5)
WBC: 2.2 10*3/uL — ABNORMAL LOW (ref 3.8–10.6)

## 2016-08-04 LAB — COMPREHENSIVE METABOLIC PANEL
ALBUMIN: 3.2 g/dL — AB (ref 3.5–5.0)
ALT: 18 U/L (ref 17–63)
AST: 21 U/L (ref 15–41)
Alkaline Phosphatase: 54 U/L (ref 38–126)
Anion gap: 6 (ref 5–15)
BUN: 20 mg/dL (ref 6–20)
CHLORIDE: 105 mmol/L (ref 101–111)
CO2: 24 mmol/L (ref 22–32)
CREATININE: 0.81 mg/dL (ref 0.61–1.24)
Calcium: 8.4 mg/dL — ABNORMAL LOW (ref 8.9–10.3)
GFR calc Af Amer: >60 mL/min (ref >60)
GFR calc non Af Amer: >60 mL/min (ref >60)
Glucose, Bld: 125 mg/dL — ABNORMAL HIGH (ref 65–99)
Potassium: 3.9 mmol/L (ref 3.5–5.1)
SODIUM: 135 mmol/L (ref 135–145)
TOTAL PROTEIN: 6 g/dL — AB (ref 6.5–8.1)
Total Bilirubin: 1.2 mg/dL (ref 0.3–1.2)

## 2016-08-04 MED ORDER — HEPARIN SOD (PORK) LOCK FLUSH 100 UNIT/ML IV SOLN
500.0000 [IU] | Freq: Once | INTRAVENOUS | Status: AC
  Administered 2016-08-04: 500 [IU] via INTRAVENOUS

## 2016-08-04 MED ORDER — HEPARIN SOD (PORK) LOCK FLUSH 100 UNIT/ML IV SOLN
INTRAVENOUS | Status: AC
  Filled 2016-08-04: qty 5

## 2016-08-04 MED ORDER — SODIUM CHLORIDE 0.9% FLUSH
10.0000 mL | Freq: Once | INTRAVENOUS | Status: AC
  Administered 2016-08-04: 10 mL via INTRAVENOUS
  Filled 2016-08-04: qty 10

## 2016-08-04 NOTE — Progress Notes (Signed)
Nutrition:  Was planning nutrition follow-up during infusion this am but patient not able to receive infusion due to low platelets.  Will plan nutrition follow-up for May 31 after MD appointment  Safiatou Islam B. Zenia Resides, [place], [place] Registered Dietitian [phone] (pager)

## 2016-08-04 NOTE — Progress Notes (Signed)
States went to ED over the weekend due to feeling dehydrated. Rash developed on chest and complains of difficulty swallowing. Having issues with constipation and is taking miralax.

## 2016-08-05 ENCOUNTER — Ambulatory Visit
Admission: RE | Admit: 2016-08-05 | Discharge: 2016-08-05 | Disposition: A | Discharge status: Home / Self Care | Payer: Medicare Other | Source: Ambulatory Visit | Attending: Radiation Oncology | Admitting: Radiation Oncology

## 2016-08-05 DIAGNOSIS — C3412 Malignant neoplasm of upper lobe, left bronchus or lung: Secondary | ICD-10-CM | POA: Diagnosis not present

## 2016-08-06 ENCOUNTER — Ambulatory Visit: Payer: Medicare Other

## 2016-08-06 ENCOUNTER — Ambulatory Visit
Admission: RE | Admit: 2016-08-06 | Discharge: 2016-08-06 | Disposition: A | Discharge status: Home / Self Care | Payer: Medicare Other | Source: Ambulatory Visit | Attending: Radiation Oncology | Admitting: Radiation Oncology

## 2016-08-06 DIAGNOSIS — C3412 Malignant neoplasm of upper lobe, left bronchus or lung: Secondary | ICD-10-CM | POA: Diagnosis not present

## 2016-08-07 ENCOUNTER — Ambulatory Visit: Payer: Medicare Other

## 2016-08-08 ENCOUNTER — Ambulatory Visit: Payer: Medicare Other

## 2016-08-11 ENCOUNTER — Ambulatory Visit: Payer: Medicare Other

## 2016-08-12 ENCOUNTER — Ambulatory Visit: Payer: Medicare Other

## 2016-08-13 ENCOUNTER — Encounter: Payer: Self-pay | Admitting: Radiation Oncology

## 2016-08-13 ENCOUNTER — Inpatient Hospital Stay: Payer: Medicare Other

## 2016-08-13 ENCOUNTER — Encounter: Payer: Self-pay | Admitting: Oncology

## 2016-08-13 ENCOUNTER — Ambulatory Visit
Admission: RE | Admit: 2016-08-13 | Discharge: 2016-08-13 | Disposition: A | Discharge status: Home / Self Care | Payer: Medicare Other | Source: Ambulatory Visit | Attending: Radiation Oncology | Admitting: Radiation Oncology

## 2016-08-13 ENCOUNTER — Other Ambulatory Visit: Payer: Self-pay | Admitting: *Deleted

## 2016-08-13 ENCOUNTER — Ambulatory Visit: Payer: Medicare Other

## 2016-08-13 ENCOUNTER — Telehealth: Payer: Self-pay | Admitting: *Deleted

## 2016-08-13 ENCOUNTER — Inpatient Hospital Stay: Payer: Medicare Other | Attending: Oncology | Admitting: Oncology

## 2016-08-13 VITALS — BP 99/62 | HR 90 | Temp 98.1°F | Resp 18 | Wt 181.0 lb

## 2016-08-13 VITALS — BP 100/68 | HR 84 | Temp 97.2°F | Ht 72.0 in | Wt 181.1 lb

## 2016-08-13 DIAGNOSIS — C3491 Malignant neoplasm of unspecified part of right bronchus or lung: Secondary | ICD-10-CM

## 2016-08-13 DIAGNOSIS — Z510 Encounter for antineoplastic radiation therapy: Secondary | ICD-10-CM | POA: Diagnosis not present

## 2016-08-13 DIAGNOSIS — R634 Abnormal weight loss: Secondary | ICD-10-CM | POA: Diagnosis not present

## 2016-08-13 DIAGNOSIS — C3412 Malignant neoplasm of upper lobe, left bronchus or lung: Secondary | ICD-10-CM | POA: Diagnosis not present

## 2016-08-13 DIAGNOSIS — J029 Acute pharyngitis, unspecified: Secondary | ICD-10-CM

## 2016-08-13 DIAGNOSIS — R630 Anorexia: Secondary | ICD-10-CM

## 2016-08-13 DIAGNOSIS — R197 Diarrhea, unspecified: Secondary | ICD-10-CM | POA: Insufficient documentation

## 2016-08-13 DIAGNOSIS — E860 Dehydration: Secondary | ICD-10-CM | POA: Insufficient documentation

## 2016-08-13 DIAGNOSIS — D649 Anemia, unspecified: Secondary | ICD-10-CM

## 2016-08-13 DIAGNOSIS — R531 Weakness: Secondary | ICD-10-CM

## 2016-08-13 DIAGNOSIS — I998 Other disorder of circulatory system: Secondary | ICD-10-CM | POA: Insufficient documentation

## 2016-08-13 DIAGNOSIS — K5900 Constipation, unspecified: Secondary | ICD-10-CM | POA: Diagnosis not present

## 2016-08-13 DIAGNOSIS — Z87891 Personal history of nicotine dependence: Secondary | ICD-10-CM | POA: Diagnosis not present

## 2016-08-13 DIAGNOSIS — R12 Heartburn: Secondary | ICD-10-CM

## 2016-08-13 DIAGNOSIS — M2550 Pain in unspecified joint: Secondary | ICD-10-CM | POA: Diagnosis not present

## 2016-08-13 DIAGNOSIS — Z87442 Personal history of urinary calculi: Secondary | ICD-10-CM | POA: Insufficient documentation

## 2016-08-13 DIAGNOSIS — R5383 Other fatigue: Secondary | ICD-10-CM

## 2016-08-13 DIAGNOSIS — Z806 Family history of leukemia: Secondary | ICD-10-CM | POA: Diagnosis not present

## 2016-08-13 DIAGNOSIS — R627 Adult failure to thrive: Secondary | ICD-10-CM | POA: Diagnosis not present

## 2016-08-13 DIAGNOSIS — D61818 Other pancytopenia: Secondary | ICD-10-CM

## 2016-08-13 DIAGNOSIS — F419 Anxiety disorder, unspecified: Secondary | ICD-10-CM | POA: Diagnosis not present

## 2016-08-13 DIAGNOSIS — D61810 Antineoplastic chemotherapy induced pancytopenia: Secondary | ICD-10-CM | POA: Diagnosis not present

## 2016-08-13 DIAGNOSIS — Z79899 Other long term (current) drug therapy: Secondary | ICD-10-CM

## 2016-08-13 DIAGNOSIS — Z5111 Encounter for antineoplastic chemotherapy: Secondary | ICD-10-CM | POA: Insufficient documentation

## 2016-08-13 LAB — CBC WITH DIFFERENTIAL/PLATELET
BASOS ABS: 0 10*3/uL (ref 0–0.1)
Basophils Relative: 1 %
Eosinophils Absolute: 0.1 10*3/uL (ref 0–0.7)
Eosinophils Relative: 2 %
HCT: 28.7 % — ABNORMAL LOW (ref 40.0–52.0)
HEMOGLOBIN: 10.1 g/dL — AB (ref 13.0–18.0)
LYMPHS PCT: 5 %
Lymphs Abs: 0.2 10*3/uL — ABNORMAL LOW (ref 1.0–3.6)
MCH: 32.9 pg (ref 26.0–34.0)
MCHC: 35.1 g/dL (ref 32.0–36.0)
MCV: 93.8 fL (ref 80.0–100.0)
Monocytes Absolute: 0.6 10*3/uL (ref 0.2–1.0)
Monocytes Relative: 18 %
NEUTROS PCT: 74 %
Neutro Abs: 2.7 10*3/uL (ref 1.4–6.5)
Platelets: 98 10*3/uL — ABNORMAL LOW (ref 150–440)
RBC: 3.07 MIL/uL — AB (ref 4.40–5.90)
RDW: 19.4 % — ABNORMAL HIGH (ref 11.5–14.5)
WBC: 3.6 10*3/uL — AB (ref 3.8–10.6)

## 2016-08-13 MED ORDER — DEXAMETHASONE 4 MG PO TABS: 4.0000 mg | ORAL_TABLET | Freq: Every day | ORAL | 1 refills | Status: DC

## 2016-08-13 MED ORDER — MAGIC MOUTHWASH W/LIDOCAINE: 5.0000 mL | Freq: Three times a day (TID) | ORAL | 1 refills | Status: DC

## 2016-08-13 NOTE — Telephone Encounter (Signed)
Appt given for 1130

## 2016-08-13 NOTE — Telephone Encounter (Signed)
Asking if he can see Dr Grayland Ormond when he comes in to see Dr Baruch Gouty today, stating he has had a really bad 2 weeks and she is concerned. Very weak not eating or drinking diarrhea, not sleeping. Not getting around much, complains of legs hurting.

## 2016-08-13 NOTE — Telephone Encounter (Signed)
We will see patient today per finn

## 2016-08-13 NOTE — Progress Notes (Signed)
Patient has large bruise on left should and a rash on his back. Daughter reports patients energy level has decreased over the past several months and his appetite is fair. His throat is sore.

## 2016-08-13 NOTE — Progress Notes (Signed)
Radiation Oncology Follow up Note  Name: Frank Cowan   Date:   08/13/2016 MRN:  [id number] DOB: 19-Aug-1940    This 76 y.o. male presents to the clinic today for one-week follow-up status post initial course of radiation for stage IIIB squamous cell carcinoma the left upper lobe been treated in split course fashion.  REFERRING PROVIDER: Claiborne Billings, MD  HPI: Patient is a 76 year old male now out 1 week having completed initial course of radiation therapy to his chest for stage IIIB squamous cell carcinoma the left upper lobe (T3 N3 M0). Seen today in routine follow-up he is feeling weak and fatigued. He's been battling diarrhea which is been going on for quite some time. He specifically denies cough hemoptysis or chest tightness he feels his chest is improved with less pain and discomfort..  COMPLICATIONS OF TREATMENT: none  FOLLOW UP COMPLIANCE: keeps appointments   PHYSICAL EXAM:  BP 99/62   Pulse 90   Temp 98.1 F (36.7 C)   Resp 18   Wt 181 lb (82.1 kg)   BMI 24.55 kg/m  Elderly male wheelchair-bound looking quite fatigued. Well-developed well-nourished patient in NAD. HEENT reveals PERLA, EOMI, discs not visualized.  Oral cavity is clear. No oral mucosal lesions are identified. Neck is clear without evidence of cervical or supraclavicular adenopathy. Lungs are clear to A&P. Cardiac examination is essentially unremarkable with regular rate and rhythm without murmur rub or thrill. Abdomen is benign with no organomegaly or masses noted. Motor sensory and DTR levels are equal and symmetric in the upper and lower extremities. Cranial nerves II through XII are grossly intact. Proprioception is intact. No peripheral adenopathy or edema is identified. No motor or sensory levels are noted. Crude visual fields are within normal range.  RADIOLOGY RESULTS: No current films for review  PLAN: Present time like to rescan the patient for possibility of small field lung boost. Depending on  findings may give another 2600 cGy if we see response. Patient will also be seen medical oncology today for his ongoing problems with diarrhea. May want to give additional chemotherapy during his radiation if we proceed in that route. I personally set up and ordered CT simulation for next week to allow him to recover's somewhat more. Daughter and patient seem to comprehend my treatment plan well.  I would like to take this opportunity to thank you for allowing me to participate in the care of your patient.Armstead Peaks., MD

## 2016-08-14 ENCOUNTER — Ambulatory Visit: Payer: Medicare Other

## 2016-08-15 ENCOUNTER — Ambulatory Visit: Payer: Medicare Other

## 2016-08-17 NOTE — Progress Notes (Signed)
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  INTERVAL HISTORY: Patient returns to clinic today as an add-on with multiple medical complaints. He feels increasingly weak and fatigued. He continues to have a sore throat and a poor appetite. He also complains of increased bruising.  He continues to be highly anxious. He denies any recent fevers. He has no chest pain, shortness of breath, cough, or hemoptysis. He denies any further nausea or vomiting. He denies any diarrhea. He has no urinary complaints. Patient offers no further specific complaints today.  REVIEW OF SYSTEMS:   Review of Systems  Constitutional: Positive for malaise/fatigue. Negative for fever and weight loss.  HENT: Positive for sore throat.   Respiratory: Negative.  Negative for cough, hemoptysis and shortness of breath.   Cardiovascular: Negative.  Negative for chest pain and leg swelling.  Gastrointestinal: Positive for constipation and heartburn. Negative for abdominal pain, blood in stool, nausea and vomiting.  Genitourinary: Negative.  Negative for frequency.  Musculoskeletal: Positive for joint pain. Negative for myalgias.  Skin: Negative.  Negative for rash.  Neurological: Positive for weakness. Negative for sensory change and headaches.  Endo/Heme/Allergies: Bruises/bleeds easily.  Psychiatric/Behavioral: Positive for memory loss. The patient is nervous/anxious.     As per HPI. Otherwise, a complete review of systems is negative.  PAST MEDICAL HISTORY: Past Medical History:  Diagnosis Date  . Cancer ([place])    lung  . Cataract   . Collagen vascular disease ([place])   . History of kidney stones   . Kidney stones 2014    PAST SURGICAL HISTORY: Past  Surgical History:  Procedure Laterality Date  . CYSTOSCOPY W/ URETEROSCOPY W/ LITHOTRIPSY    . ENDOBRONCHIAL ULTRASOUND N/A 06/12/2016   Procedure: ENDOBRONCHIAL ULTRASOUND;  Surgeon: Laverle Hobby, MD;  Location: [facility] ORS;  Service: Pulmonary;  Laterality: N/A;  . PORTA CATH INSERTION N/A 06/26/2016   Procedure: Glori Luis Cath Insertion;  Surgeon: Algernon Huxley, MD;  Location: [place] [facility];  Service: Cardiovascular;  Laterality: N/A;  . TONSILLECTOMY      FAMILY HISTORY: Family History  Problem Relation Age of Onset  . Heart disease Mother   . Heart disease Father   . Cancer Sister 6    luekemia    ADVANCED DIRECTIVES (Y/N):  N  HEALTH MAINTENANCE: Social History  Substance Use Topics  . Smoking status: Former Smoker    Packs/day: 0.50    Years: 55.00    Quit date: 03/28/2016  . Smokeless tobacco: Former Systems developer  . Alcohol use No     Comment: Not lately     Colonoscopy:  PAP:  Bone density:  Lipid panel:  No Known Allergies  Current Outpatient Prescriptions  Medication Sig Dispense Refill  . acetaminophen (TYLENOL) 500 MG tablet Take 500 mg by mouth every 8 (eight) hours as needed for mild pain or headache. On hold for procedure    . esomeprazole (NEXIUM) 40 MG capsule Take 1 capsule (40 mg total) by mouth daily. 30 capsule 1  . folic acid (FOLVITE) 1 MG tablet Take 1 mg by mouth daily.     Marland Kitchen lidocaine (XYLOCAINE) 2 % solution Use as directed 20 mLs in the mouth or throat as needed for mouth pain. 100 mL 0  .  lidocaine-prilocaine (EMLA) cream Apply to affected area once 30 g 3  . megestrol (MEGACE) 40 MG tablet Take 1 tablet (40 mg total) by mouth daily. 30 tablet 1  . methotrexate (RHEUMATREX) 2.5 MG tablet Take 20 mg by mouth once a week. Wednesday    . naproxen sodium (ANAPROX) 220 MG tablet Take 220 mg by mouth 2 (two) times daily with a meal.    . ondansetron (ZOFRAN) 8 MG tablet Take 1 tablet (8 mg total) by mouth 2 (two) times daily as needed for  refractory nausea / vomiting. 60 tablet 2  . oxyCODONE-acetaminophen (PERCOCET/ROXICET) 5-325 MG tablet Take 1-2 tablets by mouth every 6 (six) hours as needed for severe pain. 60 tablet 0  . prochlorperazine (COMPAZINE) 10 MG tablet Take 1 tablet (10 mg total) by mouth every 6 (six) hours as needed (Nausea or vomiting). 60 tablet 2  . tamsulosin (FLOMAX) 0.4 MG CAPS capsule Take 0.4 mg by mouth daily.     Marland Kitchen triamcinolone ointment (KENALOG) 0.5 % Apply 1 application topically 3 (three) times daily. 30 g 0  . dexamethasone (DECADRON) 4 MG tablet Take 1 tablet (4 mg total) by mouth daily. 30 tablet 1  . magic mouthwash w/lidocaine SOLN Take 5 mLs by mouth 3 (three) times daily. 300 mL 1  . sucralfate (CARAFATE) 1 g tablet Take 1 tablet (1 g total) by mouth 3 (three) times daily. Dissolve in 2-3 TBSP warm water, swish and swallow. (Patient not taking: Reported on 08/13/2016) 90 tablet 3   No current facility-administered medications for this visit.     OBJECTIVE: Vitals:   08/13/16 1201  BP: 100/68  Pulse: 84  Temp: 97.2 F (36.2 C)     Body mass index is 24.56 kg/m.    ECOG FS:0 - Asymptomatic  General: Well-developed, well-nourished, no acute distress. Eyes: Pink conjunctiva, anicteric sclera. Lungs: Clear to auscultation bilaterally. Heart: Regular rate and rhythm. No rubs, murmurs, or gallops. Abdomen: Soft, nontender, nondistended. No organomegaly noted, normoactive bowel sounds. Musculoskeletal: No edema. Neuro: Alert, answering all questions appropriately. Cranial nerves grossly intact. Skin: No rashes or petechiae noted. Psych: Normal affect.  LAB RESULTS:  Lab Results  Component Value Date   NA 135 08/04/2016   K 3.9 08/04/2016   CL 105 08/04/2016   CO2 24 08/04/2016   GLUCOSE 125 (H) 08/04/2016   BUN 20 08/04/2016   CREATININE 0.81 08/04/2016   CALCIUM 8.4 (L) 08/04/2016   PROT 6.0 (L) 08/04/2016   ALBUMIN 3.2 (L) 08/04/2016   AST 21 08/04/2016   ALT 18 08/04/2016     ALKPHOS 54 08/04/2016   BILITOT 1.2 08/04/2016   GFRNONAA >60 08/04/2016   GFRAA >60 08/04/2016    Lab Results  Component Value Date   WBC 3.6 (L) 08/13/2016   NEUTROABS 2.7 08/13/2016   HGB 10.1 (L) 08/13/2016   HCT 28.7 (L) 08/13/2016   MCV 93.8 08/13/2016   PLT 98 (L) 08/13/2016     STUDIES: No results found.  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  PLAN:    1. 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 MRI the brain is negative. Continue daily XRT, completing on August 06, 2016. Given patient's pancytopenia, will discontinue weekly chemotherapy altogether at this point. Patient is having and XRT boost in mid May, therefore he will return to  clinic on Aug 25, 2016 for reconsideration of additional carboplatin and Taxol. Can consider maintenance treatment with Durvalumab starting approximately 6 weeks after treatment every 2 weeks for total of one year.  2. Pain: Continue current narcotic regimen. XRT as above. 3. Poor appetite/weight loss: Appreciate dietary input. Patient states Megace was not helpful and was given a prescription for dexamethasone today.  4. 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 Easy bruising: Patient continues to have thrombocytopenia, but this is mildly improved from previous. Monitor. 6. Constipation/diarrhea: Patient was recommended to try a stool softener once a day in attempt for more regular bowel movements. 7. Anemia: Mild, monitor. Secondary to chemotherapy. 8. Sore throat: Patient was given a prescription for Magic mouthwash today.  Patient expressed understanding and was in agreement with this plan. He also understands that He can call clinic at any time with any questions, concerns, or complaints.    Cancer Staging Squamous cell lung cancer, right  [facility]) Staging form: Lung, AJCC 8th Edition - Clinical stage from 06/19/2016: Stage IIIC (cT3, cN3, cM0) - Signed by Lloyd Huger, MD on 06/19/2016    Lloyd Huger, MD 08/17/16 4:11 PM

## 2016-08-18 ENCOUNTER — Ambulatory Visit: Payer: Medicare Other

## 2016-08-19 ENCOUNTER — Ambulatory Visit
Admission: RE | Admit: 2016-08-19 | Discharge: 2016-08-19 | Disposition: A | Discharge status: Home / Self Care | Payer: Medicare Other | Source: Ambulatory Visit | Attending: Radiation Oncology | Admitting: Radiation Oncology

## 2016-08-19 ENCOUNTER — Ambulatory Visit: Payer: Medicare Other

## 2016-08-19 DIAGNOSIS — C3412 Malignant neoplasm of upper lobe, left bronchus or lung: Secondary | ICD-10-CM | POA: Diagnosis not present

## 2016-08-20 ENCOUNTER — Ambulatory Visit: Payer: Medicare Other

## 2016-08-24 NOTE — Progress Notes (Signed)
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  INTERVAL HISTORY: Patient returns to clinic today for further evaluation and reinitiation of weekly chemotherapy along with his XRT boost. He currently feels well and is asymptomatic. He does not complain of weakness or fatigue today. He no longer complains of a sore throat and his appetite has improved.  He continues to be highly anxious. He denies any recent fevers. He has no chest pain, shortness of breath, cough, or hemoptysis. He denies any further nausea or vomiting. He denies any diarrhea. He has no urinary complaints. Patient offers no further specific complaints today.  REVIEW OF SYSTEMS:   Review of Systems  Constitutional: Negative for fever, malaise/fatigue and weight loss.  HENT: Negative.  Negative for sore throat.   Respiratory: Negative.  Negative for cough, hemoptysis and shortness of breath.   Cardiovascular: Negative.  Negative for chest pain and leg swelling.  Gastrointestinal: Positive for constipation and heartburn. Negative for abdominal pain, blood in stool, nausea and vomiting.  Genitourinary: Negative.  Negative for frequency.  Musculoskeletal: Positive for joint pain. Negative for myalgias.  Skin: Negative.  Negative for rash.  Neurological: Negative for sensory change, weakness and headaches.  Endo/Heme/Allergies: Does not bruise/bleed easily.  Psychiatric/Behavioral: Positive for memory loss. The patient is nervous/anxious.     As per HPI. Otherwise, a complete review of systems is negative.  PAST MEDICAL HISTORY: Past Medical History:  Diagnosis Date  . Cancer ([place])    lung  . Cataract   . Collagen vascular disease ([place])   . History of kidney  stones   . Kidney stones 2014    PAST SURGICAL HISTORY: Past Surgical History:  Procedure Laterality Date  . CYSTOSCOPY W/ URETEROSCOPY W/ LITHOTRIPSY    . ENDOBRONCHIAL ULTRASOUND N/A 06/12/2016   Procedure: ENDOBRONCHIAL ULTRASOUND;  Surgeon: Laverle Hobby, MD;  Location: [facility] ORS;  Service: Pulmonary;  Laterality: N/A;  . PORTA CATH INSERTION N/A 06/26/2016   Procedure: Glori Luis Cath Insertion;  Surgeon: Algernon Huxley, MD;  Location: [place] [facility];  Service: Cardiovascular;  Laterality: N/A;  . TONSILLECTOMY      FAMILY HISTORY: Family History  Problem Relation Age of Onset  . Heart disease Mother   . Heart disease Father   . Cancer Sister 6       luekemia    ADVANCED DIRECTIVES (Y/N):  N  HEALTH MAINTENANCE: Social History  Substance Use Topics  . Smoking status: Former Smoker    Packs/day: 0.50    Years: 55.00    Quit date: 03/28/2016  . Smokeless tobacco: Former Systems developer  . Alcohol use No     Comment: Not lately     Colonoscopy:  PAP:  Bone density:  Lipid panel:  No Known Allergies  Current Outpatient Prescriptions  Medication Sig Dispense Refill  . acetaminophen (TYLENOL) 500 MG tablet Take 500 mg by mouth every 8 (eight) hours as needed for mild pain or headache. On hold for procedure    . dexamethasone (DECADRON) 4 MG tablet Take 1 tablet (4 mg total) by mouth daily. 30 tablet 1  . esomeprazole (NEXIUM) 40 MG capsule Take 1 capsule (40 mg total) by mouth daily. 30 capsule 1  . folic acid (FOLVITE) 1 MG tablet Take 1  mg by mouth daily.     Marland Kitchen lidocaine (XYLOCAINE) 2 % solution Use as directed 20 mLs in the mouth or throat as needed for mouth pain. 100 mL 0  . lidocaine-prilocaine (EMLA) cream Apply to affected area once 30 g 3  . magic mouthwash w/lidocaine SOLN Take 5 mLs by mouth 3 (three) times daily. 300 mL 1  . megestrol (MEGACE) 40 MG tablet Take 1 tablet (40 mg total) by mouth daily. 30 tablet 1  . methotrexate (RHEUMATREX) 2.5 MG tablet Take 20  mg by mouth once a week. Wednesday    . naproxen sodium (ANAPROX) 220 MG tablet Take 220 mg by mouth 2 (two) times daily with a meal.    . ondansetron (ZOFRAN) 8 MG tablet Take 1 tablet (8 mg total) by mouth 2 (two) times daily as needed for refractory nausea / vomiting. 60 tablet 2  . oxyCODONE-acetaminophen (PERCOCET/ROXICET) 5-325 MG tablet Take 1-2 tablets by mouth every 6 (six) hours as needed for severe pain. 60 tablet 0  . prochlorperazine (COMPAZINE) 10 MG tablet Take 1 tablet (10 mg total) by mouth every 6 (six) hours as needed (Nausea or vomiting). 60 tablet 2  . sucralfate (CARAFATE) 1 g tablet Take 1 tablet (1 g total) by mouth 3 (three) times daily. Dissolve in 2-3 TBSP warm water, swish and swallow. 90 tablet 3  . tamsulosin (FLOMAX) 0.4 MG CAPS capsule Take 0.4 mg by mouth daily.     Marland Kitchen triamcinolone ointment (KENALOG) 0.5 % Apply 1 application topically 3 (three) times daily. 30 g 0   No current facility-administered medications for this visit.     OBJECTIVE: Vitals:   08/25/16 0923  BP: 101/60  Pulse: 65  Resp: 18  Temp: (!) 96.3 F (35.7 C)     Body mass index is 23.98 kg/m.    ECOG FS:0 - Asymptomatic  General: Well-developed, well-nourished, no acute distress. Eyes: Pink conjunctiva, anicteric sclera. Lungs: Clear to auscultation bilaterally. Heart: Regular rate and rhythm. No rubs, murmurs, or gallops. Abdomen: Soft, nontender, nondistended. No organomegaly noted, normoactive bowel sounds. Musculoskeletal: No edema. Neuro: Alert, answering all questions appropriately. Cranial nerves grossly intact. Skin: No rashes or petechiae noted. Psych: Normal affect.  LAB RESULTS:  Lab Results  Component Value Date   NA 134 (L) 08/25/2016   K 3.8 08/25/2016   CL 104 08/25/2016   CO2 19 (L) 08/25/2016   GLUCOSE 202 (H) 08/25/2016   BUN 34 (H) 08/25/2016   CREATININE 0.70 08/25/2016   CALCIUM 8.8 (L) 08/25/2016   PROT 6.1 (L) 08/25/2016   ALBUMIN 3.3 (L) 08/25/2016    AST 24 08/25/2016   ALT 24 08/25/2016   ALKPHOS 67 08/25/2016   BILITOT 0.9 08/25/2016   GFRNONAA >60 08/25/2016   GFRAA >60 08/25/2016    Lab Results  Component Value Date   WBC 4.0 08/25/2016   NEUTROABS 3.6 08/25/2016   HGB 10.6 (L) 08/25/2016   HCT 29.9 (L) 08/25/2016   MCV 94.7 08/25/2016   PLT 124 (L) 08/25/2016     STUDIES: No results found.  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  PLAN:    1. 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 MRI the brain is negative. Patient will reinitiate his daily XRT boost on Aug 28, 2016. His last XRT treatment is scheduled for September 18, 2016. Proceed with cycle 6 of weekly carboplatinum and Taxol today. We will likely give 2-3 more treatments during his XRT boost.  Can consider maintenance treatment with Durvalumab starting approximately 6 weeks after treatment every 2 weeks for total of one year.  2. Pain: Patient does not complain of this today. Continue current narcotic regimen. XRT as above. 3. Poor appetite/weight loss: Improved. Appreciate dietary input.  4. 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 Easy bruising: Resolved. Platelet count remains decreased, but stable.  6. Constipation/diarrhea: Patient was recommended to try a stool softener once a day in attempt for more regular bowel movements. 7. Anemia: Mild, monitor. Secondary to chemotherapy. 8. Sore throat: Continue Magic mouthwash as needed.  Patient expressed understanding and was in agreement with this plan. He also understands that He can call clinic at any time with any questions, concerns, or complaints.    Cancer Staging Squamous cell lung cancer, right [facility]) Staging form: Lung, AJCC 8th Edition - Clinical stage from 06/19/2016: Stage IIIC (cT3, cN3, cM0) - Signed by Lloyd Huger, MD on 06/19/2016    Lloyd Huger, MD 08/25/16 10:01 AM

## 2016-08-25 ENCOUNTER — Inpatient Hospital Stay: Payer: Medicare Other

## 2016-08-25 ENCOUNTER — Inpatient Hospital Stay (HOSPITAL_BASED_OUTPATIENT_CLINIC_OR_DEPARTMENT_OTHER): Payer: Medicare Other | Admitting: Oncology

## 2016-08-25 VITALS — BP 101/60 | HR 65 | Temp 96.3°F | Resp 18 | Wt 176.8 lb

## 2016-08-25 DIAGNOSIS — D61818 Other pancytopenia: Secondary | ICD-10-CM | POA: Diagnosis not present

## 2016-08-25 DIAGNOSIS — R197 Diarrhea, unspecified: Secondary | ICD-10-CM

## 2016-08-25 DIAGNOSIS — C3491 Malignant neoplasm of unspecified part of right bronchus or lung: Secondary | ICD-10-CM

## 2016-08-25 DIAGNOSIS — K5900 Constipation, unspecified: Secondary | ICD-10-CM

## 2016-08-25 DIAGNOSIS — Z87442 Personal history of urinary calculi: Secondary | ICD-10-CM

## 2016-08-25 DIAGNOSIS — R630 Anorexia: Secondary | ICD-10-CM | POA: Diagnosis not present

## 2016-08-25 DIAGNOSIS — Z806 Family history of leukemia: Secondary | ICD-10-CM

## 2016-08-25 DIAGNOSIS — R634 Abnormal weight loss: Secondary | ICD-10-CM

## 2016-08-25 DIAGNOSIS — Z87891 Personal history of nicotine dependence: Secondary | ICD-10-CM

## 2016-08-25 DIAGNOSIS — F419 Anxiety disorder, unspecified: Secondary | ICD-10-CM | POA: Diagnosis not present

## 2016-08-25 DIAGNOSIS — M2550 Pain in unspecified joint: Secondary | ICD-10-CM

## 2016-08-25 DIAGNOSIS — C3412 Malignant neoplasm of upper lobe, left bronchus or lung: Secondary | ICD-10-CM

## 2016-08-25 DIAGNOSIS — J029 Acute pharyngitis, unspecified: Secondary | ICD-10-CM

## 2016-08-25 DIAGNOSIS — R531 Weakness: Secondary | ICD-10-CM

## 2016-08-25 DIAGNOSIS — Z79899 Other long term (current) drug therapy: Secondary | ICD-10-CM

## 2016-08-25 DIAGNOSIS — R5383 Other fatigue: Secondary | ICD-10-CM

## 2016-08-25 DIAGNOSIS — R12 Heartburn: Secondary | ICD-10-CM

## 2016-08-25 DIAGNOSIS — D649 Anemia, unspecified: Secondary | ICD-10-CM

## 2016-08-25 DIAGNOSIS — I998 Other disorder of circulatory system: Secondary | ICD-10-CM

## 2016-08-25 LAB — CBC WITH DIFFERENTIAL/PLATELET
Basophils Absolute: 0 K/uL (ref 0–0.1)
Basophils Relative: 0 %
Eosinophils Absolute: 0 K/uL (ref 0–0.7)
Eosinophils Relative: 0 %
HCT: 29.9 % — ABNORMAL LOW (ref 40.0–52.0)
Hemoglobin: 10.6 g/dL — ABNORMAL LOW (ref 13.0–18.0)
Lymphocytes Relative: 6 %
Lymphs Abs: 0.2 K/uL — ABNORMAL LOW (ref 1.0–3.6)
MCH: 33.5 pg (ref 26.0–34.0)
MCHC: 35.3 g/dL (ref 32.0–36.0)
MCV: 94.7 fL (ref 80.0–100.0)
Monocytes Absolute: 0.2 K/uL (ref 0.2–1.0)
Monocytes Relative: 4 %
Neutro Abs: 3.6 K/uL (ref 1.4–6.5)
Neutrophils Relative %: 90 %
Platelets: 124 K/uL — ABNORMAL LOW (ref 150–440)
RBC: 3.16 MIL/uL — ABNORMAL LOW (ref 4.40–5.90)
RDW: 20 % — ABNORMAL HIGH (ref 11.5–14.5)
WBC: 4 K/uL (ref 3.8–10.6)

## 2016-08-25 LAB — COMPREHENSIVE METABOLIC PANEL
ALBUMIN: 3.3 g/dL — AB (ref 3.5–5.0)
ALK PHOS: 67 U/L (ref 38–126)
ALT: 24 U/L (ref 17–63)
ANION GAP: 11 (ref 5–15)
AST: 24 U/L (ref 15–41)
BUN: 34 mg/dL — ABNORMAL HIGH (ref 6–20)
CALCIUM: 8.8 mg/dL — AB (ref 8.9–10.3)
CHLORIDE: 104 mmol/L (ref 101–111)
CO2: 19 mmol/L — AB (ref 22–32)
Creatinine, Ser: 0.7 mg/dL (ref 0.61–1.24)
GFR calc Af Amer: >60 mL/min (ref >60)
GFR calc non Af Amer: >60 mL/min (ref >60)
GLUCOSE: 202 mg/dL — AB (ref 65–99)
Potassium: 3.8 mmol/L (ref 3.5–5.1)
SODIUM: 134 mmol/L — AB (ref 135–145)
Total Bilirubin: 0.9 mg/dL (ref 0.3–1.2)
Total Protein: 6.1 g/dL — ABNORMAL LOW (ref 6.5–8.1)

## 2016-08-25 MED ORDER — PALONOSETRON HCL INJECTION 0.25 MG/5ML
0.2500 mg | Freq: Once | INTRAVENOUS | Status: AC
  Administered 2016-08-25: 0.25 mg via INTRAVENOUS
  Filled 2016-08-25: qty 5

## 2016-08-25 MED ORDER — DIPHENHYDRAMINE HCL 50 MG/ML IJ SOLN
25.0000 mg | Freq: Once | INTRAMUSCULAR | Status: AC
  Administered 2016-08-25: 25 mg via INTRAVENOUS
  Filled 2016-08-25: qty 1

## 2016-08-25 MED ORDER — CARBOPLATIN CHEMO INJECTION 450 MG/45ML
198.0000 mg | Freq: Once | INTRAVENOUS | Status: AC
  Administered 2016-08-25: 200 mg via INTRAVENOUS
  Filled 2016-08-25: qty 20

## 2016-08-25 MED ORDER — SODIUM CHLORIDE 0.9 % IV SOLN
Freq: Once | INTRAVENOUS | Status: AC
  Administered 2016-08-25: 10:00:00 via INTRAVENOUS
  Filled 2016-08-25: qty 1000

## 2016-08-25 MED ORDER — PACLITAXEL CHEMO INJECTION 300 MG/50ML
45.0000 mg/m2 | Freq: Once | INTRAVENOUS | Status: AC
  Administered 2016-08-25: 96 mg via INTRAVENOUS
  Filled 2016-08-25: qty 16

## 2016-08-25 MED ORDER — HEPARIN SOD (PORK) LOCK FLUSH 100 UNIT/ML IV SOLN
500.0000 [IU] | Freq: Once | INTRAVENOUS | Status: AC | PRN
  Administered 2016-08-25: 500 [IU]
  Filled 2016-08-25: qty 5

## 2016-08-25 MED ORDER — SODIUM CHLORIDE 0.9% FLUSH
10.0000 mL | INTRAVENOUS | Status: DC | PRN
  Administered 2016-08-25: 10 mL
  Filled 2016-08-25: qty 10

## 2016-08-25 MED ORDER — FAMOTIDINE IN NACL 20-0.9 MG/50ML-% IV SOLN
20.0000 mg | Freq: Once | INTRAVENOUS | Status: AC
  Administered 2016-08-25: 20 mg via INTRAVENOUS
  Filled 2016-08-25: qty 50

## 2016-08-25 MED ORDER — DEXAMETHASONE SODIUM PHOSPHATE 10 MG/ML IJ SOLN
10.0000 mg | Freq: Once | INTRAMUSCULAR | Status: AC
  Administered 2016-08-25: 10 mg via INTRAVENOUS
  Filled 2016-08-25: qty 1

## 2016-08-25 NOTE — Progress Notes (Signed)
Offers no complaints. Feeling well. 

## 2016-08-28 ENCOUNTER — Ambulatory Visit
Admission: RE | Admit: 2016-08-28 | Discharge: 2016-08-28 | Disposition: A | Discharge status: Home / Self Care | Payer: Medicare Other | Source: Ambulatory Visit | Attending: Radiation Oncology | Admitting: Radiation Oncology

## 2016-08-28 DIAGNOSIS — C3412 Malignant neoplasm of upper lobe, left bronchus or lung: Secondary | ICD-10-CM | POA: Diagnosis not present

## 2016-08-31 NOTE — Progress Notes (Deleted)
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  INTERVAL HISTORY: Patient returns to clinic today for further evaluation and reinitiation of weekly chemotherapy along with his XRT boost. He currently feels well and is asymptomatic. He does not complain of weakness or fatigue today. He no longer complains of a sore throat and his appetite has improved.  He continues to be highly anxious. He denies any recent fevers. He has no chest pain, shortness of breath, cough, or hemoptysis. He denies any further nausea or vomiting. He denies any diarrhea. He has no urinary complaints. Patient offers no further specific complaints today.  REVIEW OF SYSTEMS:   Review of Systems  Constitutional: Negative for fever, malaise/fatigue and weight loss.  HENT: Negative.  Negative for sore throat.   Respiratory: Negative.  Negative for cough, hemoptysis and shortness of breath.   Cardiovascular: Negative.  Negative for chest pain and leg swelling.  Gastrointestinal: Positive for constipation and heartburn. Negative for abdominal pain, blood in stool, nausea and vomiting.  Genitourinary: Negative.  Negative for frequency.  Musculoskeletal: Positive for joint pain. Negative for myalgias.  Skin: Negative.  Negative for rash.  Neurological: Negative for sensory change, weakness and headaches.  Endo/Heme/Allergies: Does not bruise/bleed easily.  Psychiatric/Behavioral: Positive for memory loss. The patient is nervous/anxious.     As per HPI. Otherwise, a complete review of systems is negative.  PAST MEDICAL HISTORY: Past Medical History:  Diagnosis Date  . Cancer ([place])    lung  . Cataract   . Collagen vascular disease ([place])   . History of kidney  stones   . Kidney stones 2014    PAST SURGICAL HISTORY: Past Surgical History:  Procedure Laterality Date  . CYSTOSCOPY W/ URETEROSCOPY W/ LITHOTRIPSY    . ENDOBRONCHIAL ULTRASOUND N/A 06/12/2016   Procedure: ENDOBRONCHIAL ULTRASOUND;  Surgeon: Laverle Hobby, MD;  Location: [facility] ORS;  Service: Pulmonary;  Laterality: N/A;  . PORTA CATH INSERTION N/A 06/26/2016   Procedure: Glori Luis Cath Insertion;  Surgeon: Algernon Huxley, MD;  Location: [place] [facility];  Service: Cardiovascular;  Laterality: N/A;  . TONSILLECTOMY      FAMILY HISTORY: Family History  Problem Relation Age of Onset  . Heart disease Mother   . Heart disease Father   . Cancer Sister 6       luekemia    ADVANCED DIRECTIVES (Y/N):  N  HEALTH MAINTENANCE: Social History  Substance Use Topics  . Smoking status: Former Smoker    Packs/day: 0.50    Years: 55.00    Quit date: 03/28/2016  . Smokeless tobacco: Former Systems developer  . Alcohol use No     Comment: Not lately     Colonoscopy:  PAP:  Bone density:  Lipid panel:  No Known Allergies  Current Outpatient Prescriptions  Medication Sig Dispense Refill  . acetaminophen (TYLENOL) 500 MG tablet Take 500 mg by mouth every 8 (eight) hours as needed for mild pain or headache. On hold for procedure    . dexamethasone (DECADRON) 4 MG tablet Take 1 tablet (4 mg total) by mouth daily. 30 tablet 1  . esomeprazole (NEXIUM) 40 MG capsule Take 1 capsule (40 mg total) by mouth daily. 30 capsule 1  . folic acid (FOLVITE) 1 MG tablet Take 1  mg by mouth daily.     Marland Kitchen lidocaine (XYLOCAINE) 2 % solution Use as directed 20 mLs in the mouth or throat as needed for mouth pain. 100 mL 0  . lidocaine-prilocaine (EMLA) cream Apply to affected area once 30 g 3  . magic mouthwash w/lidocaine SOLN Take 5 mLs by mouth 3 (three) times daily. 300 mL 1  . megestrol (MEGACE) 40 MG tablet Take 1 tablet (40 mg total) by mouth daily. 30 tablet 1  . methotrexate (RHEUMATREX) 2.5 MG tablet Take 20  mg by mouth once a week. Wednesday    . naproxen sodium (ANAPROX) 220 MG tablet Take 220 mg by mouth 2 (two) times daily with a meal.    . ondansetron (ZOFRAN) 8 MG tablet Take 1 tablet (8 mg total) by mouth 2 (two) times daily as needed for refractory nausea / vomiting. 60 tablet 2  . oxyCODONE-acetaminophen (PERCOCET/ROXICET) 5-325 MG tablet Take 1-2 tablets by mouth every 6 (six) hours as needed for severe pain. 60 tablet 0  . prochlorperazine (COMPAZINE) 10 MG tablet Take 1 tablet (10 mg total) by mouth every 6 (six) hours as needed (Nausea or vomiting). 60 tablet 2  . sucralfate (CARAFATE) 1 g tablet Take 1 tablet (1 g total) by mouth 3 (three) times daily. Dissolve in 2-3 TBSP warm water, swish and swallow. 90 tablet 3  . tamsulosin (FLOMAX) 0.4 MG CAPS capsule Take 0.4 mg by mouth daily.     Marland Kitchen triamcinolone ointment (KENALOG) 0.5 % Apply 1 application topically 3 (three) times daily. 30 g 0   No current facility-administered medications for this visit.     OBJECTIVE: There were no vitals filed for this visit.   There is no height or weight on file to calculate BMI.    ECOG FS:0 - Asymptomatic  General: Well-developed, well-nourished, no acute distress. Eyes: Pink conjunctiva, anicteric sclera. Lungs: Clear to auscultation bilaterally. Heart: Regular rate and rhythm. No rubs, murmurs, or gallops. Abdomen: Soft, nontender, nondistended. No organomegaly noted, normoactive bowel sounds. Musculoskeletal: No edema. Neuro: Alert, answering all questions appropriately. Cranial nerves grossly intact. Skin: No rashes or petechiae noted. Psych: Normal affect.  LAB RESULTS:  Lab Results  Component Value Date   NA 134 (L) 08/25/2016   K 3.8 08/25/2016   CL 104 08/25/2016   CO2 19 (L) 08/25/2016   GLUCOSE 202 (H) 08/25/2016   BUN 34 (H) 08/25/2016   CREATININE 0.70 08/25/2016   CALCIUM 8.8 (L) 08/25/2016   PROT 6.1 (L) 08/25/2016   ALBUMIN 3.3 (L) 08/25/2016   AST 24 08/25/2016   ALT  24 08/25/2016   ALKPHOS 67 08/25/2016   BILITOT 0.9 08/25/2016   GFRNONAA >60 08/25/2016   GFRAA >60 08/25/2016    Lab Results  Component Value Date   WBC 4.0 08/25/2016   NEUTROABS 3.6 08/25/2016   HGB 10.6 (L) 08/25/2016   HCT 29.9 (L) 08/25/2016   MCV 94.7 08/25/2016   PLT 124 (L) 08/25/2016     STUDIES: No results found.  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  PLAN:    1. 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 MRI the brain is negative. Patient will reinitiate his daily XRT boost on Aug 28, 2016. His last XRT treatment is scheduled for September 18, 2016. Proceed with cycle 6 of weekly carboplatinum and  Taxol today. We will likely give 2-3 more treatments during his XRT boost.  Can consider maintenance treatment with Durvalumab starting approximately 6 weeks after treatment every 2 weeks for total of one year.  2. Pain: Patient does not complain of this today. Continue current narcotic regimen. XRT as above. 3. Poor appetite/weight loss: Improved. Appreciate dietary input.  4. 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 Easy bruising: Resolved. Platelet count remains decreased, but stable.  6. Constipation/diarrhea: Patient was recommended to try a stool softener once a day in attempt for more regular bowel movements. 7. Anemia: Mild, monitor. Secondary to chemotherapy. 8. Sore throat: Continue Magic mouthwash as needed.  Patient expressed understanding and was in agreement with this plan. He also understands that He can call clinic at any time with any questions, concerns, or complaints.    Cancer Staging Squamous cell lung cancer, right [facility]) Staging form: Lung, AJCC 8th Edition - Clinical stage from 06/19/2016: Stage IIIC (cT3, cN3, cM0) - Signed by Lloyd Huger, MD on  06/19/2016    Lloyd Huger, MD 08/31/16 10:24 PM

## 2016-09-01 ENCOUNTER — Ambulatory Visit: Payer: Medicare Other

## 2016-09-01 ENCOUNTER — Inpatient Hospital Stay: Payer: Medicare Other

## 2016-09-01 ENCOUNTER — Inpatient Hospital Stay: Payer: Medicare Other | Admitting: Oncology

## 2016-09-02 ENCOUNTER — Ambulatory Visit: Payer: Medicare Other

## 2016-09-02 ENCOUNTER — Inpatient Hospital Stay (HOSPITAL_BASED_OUTPATIENT_CLINIC_OR_DEPARTMENT_OTHER): Payer: Medicare Other | Admitting: Oncology

## 2016-09-02 ENCOUNTER — Other Ambulatory Visit: Payer: Self-pay | Admitting: Oncology

## 2016-09-02 ENCOUNTER — Inpatient Hospital Stay: Payer: Medicare Other

## 2016-09-02 ENCOUNTER — Ambulatory Visit: Admission: RE | Admit: 2016-09-02 | Payer: Medicare Other | Source: Ambulatory Visit

## 2016-09-02 VITALS — BP 99/61 | HR 76 | Temp 98.2°F | Resp 20 | Wt 174.0 lb

## 2016-09-02 DIAGNOSIS — R531 Weakness: Secondary | ICD-10-CM | POA: Diagnosis not present

## 2016-09-02 DIAGNOSIS — R5383 Other fatigue: Secondary | ICD-10-CM | POA: Diagnosis not present

## 2016-09-02 DIAGNOSIS — R12 Heartburn: Secondary | ICD-10-CM

## 2016-09-02 DIAGNOSIS — R630 Anorexia: Secondary | ICD-10-CM | POA: Diagnosis not present

## 2016-09-02 DIAGNOSIS — I998 Other disorder of circulatory system: Secondary | ICD-10-CM

## 2016-09-02 DIAGNOSIS — C3491 Malignant neoplasm of unspecified part of right bronchus or lung: Secondary | ICD-10-CM

## 2016-09-02 DIAGNOSIS — K5900 Constipation, unspecified: Secondary | ICD-10-CM

## 2016-09-02 DIAGNOSIS — D61818 Other pancytopenia: Secondary | ICD-10-CM | POA: Diagnosis not present

## 2016-09-02 DIAGNOSIS — Z806 Family history of leukemia: Secondary | ICD-10-CM

## 2016-09-02 DIAGNOSIS — C3412 Malignant neoplasm of upper lobe, left bronchus or lung: Secondary | ICD-10-CM

## 2016-09-02 DIAGNOSIS — D649 Anemia, unspecified: Secondary | ICD-10-CM | POA: Diagnosis not present

## 2016-09-02 DIAGNOSIS — F419 Anxiety disorder, unspecified: Secondary | ICD-10-CM

## 2016-09-02 DIAGNOSIS — M2550 Pain in unspecified joint: Secondary | ICD-10-CM

## 2016-09-02 DIAGNOSIS — Z87442 Personal history of urinary calculi: Secondary | ICD-10-CM

## 2016-09-02 DIAGNOSIS — Z79899 Other long term (current) drug therapy: Secondary | ICD-10-CM

## 2016-09-02 DIAGNOSIS — R197 Diarrhea, unspecified: Secondary | ICD-10-CM

## 2016-09-02 DIAGNOSIS — R634 Abnormal weight loss: Secondary | ICD-10-CM

## 2016-09-02 DIAGNOSIS — Z87891 Personal history of nicotine dependence: Secondary | ICD-10-CM

## 2016-09-02 LAB — COMPREHENSIVE METABOLIC PANEL
ALBUMIN: 3.3 g/dL — AB (ref 3.5–5.0)
ALT: 24 U/L (ref 17–63)
AST: 23 U/L (ref 15–41)
Alkaline Phosphatase: 71 U/L (ref 38–126)
Anion gap: 6 (ref 5–15)
BILIRUBIN TOTAL: 1.5 mg/dL — AB (ref 0.3–1.2)
BUN: 23 mg/dL — ABNORMAL HIGH (ref 6–20)
CALCIUM: 8.6 mg/dL — AB (ref 8.9–10.3)
CHLORIDE: 102 mmol/L (ref 101–111)
CO2: 26 mmol/L (ref 22–32)
Creatinine, Ser: 0.76 mg/dL (ref 0.61–1.24)
GFR calc Af Amer: >60 mL/min (ref >60)
GFR calc non Af Amer: >60 mL/min (ref >60)
GLUCOSE: 139 mg/dL — AB (ref 65–99)
POTASSIUM: 4.3 mmol/L (ref 3.5–5.1)
SODIUM: 134 mmol/L — AB (ref 135–145)
Total Protein: 6.4 g/dL — ABNORMAL LOW (ref 6.5–8.1)

## 2016-09-02 LAB — CBC WITH DIFFERENTIAL/PLATELET
BASOS ABS: 0.1 10*3/uL (ref 0–0.1)
BASOS PCT: 1 %
EOS ABS: 0.1 10*3/uL (ref 0–0.7)
Eosinophils Relative: 2 %
HEMATOCRIT: 34.2 % — AB (ref 40.0–52.0)
Hemoglobin: 11.7 g/dL — ABNORMAL LOW (ref 13.0–18.0)
Lymphocytes Relative: 5 %
Lymphs Abs: 0.2 10*3/uL — ABNORMAL LOW (ref 1.0–3.6)
MCH: 32.7 pg (ref 26.0–34.0)
MCHC: 34.3 g/dL (ref 32.0–36.0)
MCV: 95.4 fL (ref 80.0–100.0)
MONO ABS: 0.3 10*3/uL (ref 0.2–1.0)
Monocytes Relative: 6 %
NEUTROS ABS: 4 10*3/uL (ref 1.4–6.5)
Neutrophils Relative %: 86 %
Platelets: 131 10*3/uL — ABNORMAL LOW (ref 150–440)
RBC: 3.59 MIL/uL — ABNORMAL LOW (ref 4.40–5.90)
RDW: 18.9 % — AB (ref 11.5–14.5)
WBC: 4.6 10*3/uL (ref 3.8–10.6)

## 2016-09-02 MED ORDER — OXYCODONE-ACETAMINOPHEN 5-325 MG PO TABS: 1.0000 | ORAL_TABLET | Freq: Four times a day (QID) | ORAL | 0 refills | Status: DC | PRN

## 2016-09-02 NOTE — Progress Notes (Signed)
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  INTERVAL HISTORY: Patient returns to clinic today for further evaluation and consideration of cycle 7 of weekly carboplatin and Taxol along with his XRT boost. He is complaining of significant bilateral leg pain that makes it difficult to walk. He does not complain of weakness or fatigue today. He no longer complains of a sore throat and his appetite has improved.  He continues to be highly anxious. He denies any recent fevers. He has no chest pain, shortness of breath, cough, or hemoptysis. He denies any further nausea or vomiting. He denies any diarrhea. He has no urinary complaints. Patient offers no further specific complaints today.  REVIEW OF SYSTEMS:   Review of Systems  Constitutional: Negative for fever, malaise/fatigue and weight loss.  HENT: Negative.  Negative for sore throat.   Respiratory: Negative.  Negative for cough, hemoptysis and shortness of breath.   Cardiovascular: Negative.  Negative for chest pain and leg swelling.  Gastrointestinal: Negative for abdominal pain, blood in stool, constipation, heartburn, nausea and vomiting.  Genitourinary: Negative.  Negative for frequency.  Musculoskeletal: Positive for joint pain and myalgias. Negative for back pain.  Skin: Negative.  Negative for rash.  Neurological: Negative for sensory change, weakness and headaches.  Endo/Heme/Allergies: Does not bruise/bleed easily.  Psychiatric/Behavioral: Positive for memory loss. The patient is nervous/anxious.     As per HPI. Otherwise, a complete review of systems is negative.  PAST MEDICAL HISTORY: Past Medical History:  Diagnosis Date  . Cancer ([place])    lung  . Cataract     . Collagen vascular disease ([place])   . History of kidney stones   . Kidney stones 2014    PAST SURGICAL HISTORY: Past Surgical History:  Procedure Laterality Date  . CYSTOSCOPY W/ URETEROSCOPY W/ LITHOTRIPSY    . ENDOBRONCHIAL ULTRASOUND N/A 06/12/2016   Procedure: ENDOBRONCHIAL ULTRASOUND;  Surgeon: Laverle Hobby, MD;  Location: [facility] ORS;  Service: Pulmonary;  Laterality: N/A;  . PORTA CATH INSERTION N/A 06/26/2016   Procedure: Glori Luis Cath Insertion;  Surgeon: Algernon Huxley, MD;  Location: [place] [facility];  Service: Cardiovascular;  Laterality: N/A;  . TONSILLECTOMY      FAMILY HISTORY: Family History  Problem Relation Age of Onset  . Heart disease Mother   . Heart disease Father   . Cancer Sister 6       luekemia    ADVANCED DIRECTIVES (Y/N):  N  HEALTH MAINTENANCE: Social History  Substance Use Topics  . Smoking status: Former Smoker    Packs/day: 0.50    Years: 55.00    Quit date: 03/28/2016  . Smokeless tobacco: Former Systems developer  . Alcohol use No     Comment: Not lately     Colonoscopy:  PAP:  Bone density:  Lipid panel:  No Known Allergies  Current Outpatient Prescriptions  Medication Sig Dispense Refill  . acetaminophen (TYLENOL) 500 MG tablet Take 500 mg by mouth every 8 (eight) hours as needed for mild pain or headache. On hold for procedure    . dexamethasone (DECADRON) 4 MG tablet Take 1 tablet (4 mg total) by mouth daily. 30 tablet 1  . esomeprazole (NEXIUM) 40 MG capsule Take 1 capsule (40 mg total) by mouth daily.  30 capsule 1  . folic acid (FOLVITE) 1 MG tablet Take 1 mg by mouth daily.     Marland Kitchen lidocaine (XYLOCAINE) 2 % solution Use as directed 20 mLs in the mouth or throat as needed for mouth pain. 100 mL 0  . lidocaine-prilocaine (EMLA) cream Apply to affected area once 30 g 3  . magic mouthwash w/lidocaine SOLN Take 5 mLs by mouth 3 (three) times daily. 300 mL 1  . megestrol (MEGACE) 40 MG tablet Take 1 tablet (40 mg total) by mouth daily. 30  tablet 1  . methotrexate (RHEUMATREX) 2.5 MG tablet Take 20 mg by mouth once a week. Wednesday    . naproxen sodium (ANAPROX) 220 MG tablet Take 220 mg by mouth 2 (two) times daily with a meal.    . ondansetron (ZOFRAN) 8 MG tablet Take 1 tablet (8 mg total) by mouth 2 (two) times daily as needed for refractory nausea / vomiting. 60 tablet 2  . oxyCODONE-acetaminophen (PERCOCET/ROXICET) 5-325 MG tablet Take 1-2 tablets by mouth every 6 (six) hours as needed for severe pain. 60 tablet 0  . prochlorperazine (COMPAZINE) 10 MG tablet Take 1 tablet (10 mg total) by mouth every 6 (six) hours as needed (Nausea or vomiting). 60 tablet 2  . sucralfate (CARAFATE) 1 g tablet Take 1 tablet (1 g total) by mouth 3 (three) times daily. Dissolve in 2-3 TBSP warm water, swish and swallow. 90 tablet 3  . tamsulosin (FLOMAX) 0.4 MG CAPS capsule Take 0.4 mg by mouth daily.     Marland Kitchen triamcinolone ointment (KENALOG) 0.5 % Apply 1 application topically 3 (three) times daily. 30 g 0   No current facility-administered medications for this visit.     OBJECTIVE: Vitals:   09/02/16 1146  BP: 99/61  Pulse: 76  Resp: 20  Temp: 98.2 F (36.8 C)     Body mass index is 23.6 kg/m.    ECOG FS:0 - Asymptomatic  General: Well-developed, well-nourished, no acute distress. Eyes: Pink conjunctiva, anicteric sclera. Lungs: Clear to auscultation bilaterally. Heart: Regular rate and rhythm. No rubs, murmurs, or gallops. Abdomen: Soft, nontender, nondistended. No organomegaly noted, normoactive bowel sounds. Musculoskeletal: No edema. Neuro: Alert, answering all questions appropriately. Cranial nerves grossly intact. Skin: No rashes or petechiae noted. Psych: Normal affect.  LAB RESULTS:  Lab Results  Component Value Date   NA 134 (L) 09/02/2016   K 4.3 09/02/2016   CL 102 09/02/2016   CO2 26 09/02/2016   GLUCOSE 139 (H) 09/02/2016   BUN 23 (H) 09/02/2016   CREATININE 0.76 09/02/2016   CALCIUM 8.6 (L) 09/02/2016    PROT 6.4 (L) 09/02/2016   ALBUMIN 3.3 (L) 09/02/2016   AST 23 09/02/2016   ALT 24 09/02/2016   ALKPHOS 71 09/02/2016   BILITOT 1.5 (H) 09/02/2016   GFRNONAA >60 09/02/2016   GFRAA >60 09/02/2016    Lab Results  Component Value Date   WBC 4.6 09/02/2016   NEUTROABS 4.0 09/02/2016   HGB 11.7 (L) 09/02/2016   HCT 34.2 (L) 09/02/2016   MCV 95.4 09/02/2016   PLT 131 (L) 09/02/2016     STUDIES: No results found.  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  PLAN:    1. 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 MRI the brain is negative. Patient will reinitiate his daily XRT boost  on Aug 28, 2016. His last XRT treatment is scheduled for September 18, 2016. Proceed with cycle 7 of weekly carboplatinum and Taxol tomorrow. We will likely give 2-3 more treatments during his XRT boost.  Can consider maintenance treatment with Durvalumab starting approximately 6 weeks after treatment every 2 weeks for total of one year. Return to clinic tomorrow for treatment and then in 1 week for consideration of cycle 8. 2. Pain: Unclear etiology, but appears to be musculoskeletal in nature. Patient was given a refill of his Percocet today. 3. Poor appetite/weight loss: Improved. Appreciate dietary input.  4. 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 Easy bruising: Resolved. Platelet count remains decreased, but stable.  6. Constipation/diarrhea: Patient was recommended to try a stool softener once a day in attempt for more regular bowel movements. 7. Anemia: Mild, monitor. Secondary to chemotherapy. 8. Sore throat: Patient does not complain of this today. Continue Magic mouthwash as needed.  Patient expressed understanding and was in agreement with this plan. He also understands that He can call clinic at any time with any questions,  concerns, or complaints.    Cancer Staging Squamous cell lung cancer, right [facility]) Staging form: Lung, AJCC 8th Edition - Clinical stage from 06/19/2016: Stage IIIC (cT3, cN3, cM0) - Signed by Lloyd Huger, MD on 06/19/2016    Lloyd Huger, MD 09/02/16 12:21 PM

## 2016-09-02 NOTE — Progress Notes (Signed)
Patient reports worsening pain today, cannot walk and hurts to stand. Patient reports fall this morning at home.

## 2016-09-03 ENCOUNTER — Inpatient Hospital Stay: Payer: Medicare Other

## 2016-09-03 ENCOUNTER — Ambulatory Visit
Admission: RE | Admit: 2016-09-03 | Discharge: 2016-09-03 | Disposition: A | Discharge status: Home / Self Care | Payer: Medicare Other | Source: Ambulatory Visit | Attending: Radiation Oncology | Admitting: Radiation Oncology

## 2016-09-03 VITALS — BP 92/61 | HR 60 | Resp 20

## 2016-09-03 DIAGNOSIS — C3412 Malignant neoplasm of upper lobe, left bronchus or lung: Secondary | ICD-10-CM | POA: Diagnosis not present

## 2016-09-03 DIAGNOSIS — C3491 Malignant neoplasm of unspecified part of right bronchus or lung: Secondary | ICD-10-CM

## 2016-09-03 MED ORDER — PALONOSETRON HCL INJECTION 0.25 MG/5ML
0.2500 mg | Freq: Once | INTRAVENOUS | Status: AC
  Administered 2016-09-03: 0.25 mg via INTRAVENOUS
  Filled 2016-09-03: qty 5

## 2016-09-03 MED ORDER — DEXAMETHASONE SODIUM PHOSPHATE 10 MG/ML IJ SOLN
10.0000 mg | Freq: Once | INTRAMUSCULAR | Status: AC
  Administered 2016-09-03: 10 mg via INTRAVENOUS
  Filled 2016-09-03: qty 1

## 2016-09-03 MED ORDER — SODIUM CHLORIDE 0.9 % IV SOLN
198.0000 mg | Freq: Once | INTRAVENOUS | Status: AC
  Administered 2016-09-03: 200 mg via INTRAVENOUS
  Filled 2016-09-03: qty 20

## 2016-09-03 MED ORDER — HEPARIN SOD (PORK) LOCK FLUSH 100 UNIT/ML IV SOLN: 500.0000 [IU] | Freq: Once | INTRAVENOUS | Status: DC

## 2016-09-03 MED ORDER — SODIUM CHLORIDE 0.9% FLUSH
10.0000 mL | INTRAVENOUS | Status: DC | PRN
  Filled 2016-09-03: qty 10

## 2016-09-03 MED ORDER — PACLITAXEL CHEMO INJECTION 300 MG/50ML
45.0000 mg/m2 | Freq: Once | INTRAVENOUS | Status: AC
  Administered 2016-09-03: 96 mg via INTRAVENOUS
  Filled 2016-09-03: qty 16

## 2016-09-03 MED ORDER — HEPARIN SOD (PORK) LOCK FLUSH 100 UNIT/ML IV SOLN
INTRAVENOUS | Status: AC
  Filled 2016-09-03: qty 5

## 2016-09-03 MED ORDER — SODIUM CHLORIDE 0.9 % IV SOLN
Freq: Once | INTRAVENOUS | Status: AC
  Administered 2016-09-03: 09:00:00 via INTRAVENOUS
  Filled 2016-09-03: qty 1000

## 2016-09-03 MED ORDER — DIPHENHYDRAMINE HCL 50 MG/ML IJ SOLN
25.0000 mg | Freq: Once | INTRAMUSCULAR | Status: AC
  Administered 2016-09-03: 25 mg via INTRAVENOUS
  Filled 2016-09-03: qty 1

## 2016-09-03 MED ORDER — FAMOTIDINE IN NACL 20-0.9 MG/50ML-% IV SOLN
20.0000 mg | Freq: Once | INTRAVENOUS | Status: AC
  Administered 2016-09-03: 20 mg via INTRAVENOUS
  Filled 2016-09-03: qty 50

## 2016-09-04 ENCOUNTER — Ambulatory Visit
Admission: RE | Admit: 2016-09-04 | Discharge: 2016-09-04 | Disposition: A | Discharge status: Home / Self Care | Payer: Medicare Other | Source: Ambulatory Visit | Attending: Radiation Oncology | Admitting: Radiation Oncology

## 2016-09-04 DIAGNOSIS — C3412 Malignant neoplasm of upper lobe, left bronchus or lung: Secondary | ICD-10-CM | POA: Diagnosis not present

## 2016-09-05 ENCOUNTER — Ambulatory Visit
Admission: RE | Admit: 2016-09-05 | Discharge: 2016-09-05 | Disposition: A | Discharge status: Home / Self Care | Payer: Medicare Other | Source: Ambulatory Visit | Attending: Radiation Oncology | Admitting: Radiation Oncology

## 2016-09-05 DIAGNOSIS — C3412 Malignant neoplasm of upper lobe, left bronchus or lung: Secondary | ICD-10-CM | POA: Diagnosis not present

## 2016-09-08 ENCOUNTER — Telehealth: Payer: Self-pay | Admitting: Internal Medicine

## 2016-09-08 NOTE — Telephone Encounter (Signed)
Called in script for cipro BID x 5 days; 10 pills. Asked to call office on 5/29 if symptoms not improved.

## 2016-09-09 ENCOUNTER — Ambulatory Visit
Admission: RE | Admit: 2016-09-09 | Discharge: 2016-09-09 | Disposition: A | Discharge status: Home / Self Care | Payer: Medicare Other | Source: Ambulatory Visit | Attending: Radiation Oncology | Admitting: Radiation Oncology

## 2016-09-09 DIAGNOSIS — C3412 Malignant neoplasm of upper lobe, left bronchus or lung: Secondary | ICD-10-CM | POA: Diagnosis not present

## 2016-09-09 NOTE — Progress Notes (Signed)
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  INTERVAL HISTORY: Patient returns to clinic today for further evaluation and consideration of cycle 8 of weekly carboplatin and Taxol along with his XRT boost. Over the past week his performance status has significantly declined. He has minimal PO intake and complains of worsening constipation. He continues to have significant bilateral leg pain that makes it difficult to walk. He has worsening weakness and fatigue. He continues to be highly anxious. He also has a low-grade fever. His daughter reports worsening confusion. He has no chest pain, shortness of breath, cough, or hemoptysis. He denies any further nausea or vomiting. He denies any diarrhea. He has no urinary complaints. Patient offers no further specific complaints today.  REVIEW OF SYSTEMS:   Review of Systems  Constitutional: Positive for fever and malaise/fatigue. Negative for weight loss.  HENT: Negative.  Negative for sore throat.   Respiratory: Negative.  Negative for cough, hemoptysis and shortness of breath.   Cardiovascular: Negative.  Negative for chest pain and leg swelling.  Gastrointestinal: Positive for constipation. Negative for abdominal pain, blood in stool, heartburn, nausea and vomiting.  Genitourinary: Negative.  Negative for frequency.  Musculoskeletal: Positive for joint pain and myalgias. Negative for back pain.  Skin: Negative.  Negative for rash.  Neurological: Positive for weakness. Negative for sensory change and headaches.  Endo/Heme/Allergies: Does not bruise/bleed easily.  Psychiatric/Behavioral: Positive for memory loss. The patient is nervous/anxious.     As per HPI. Otherwise, a complete  review of systems is negative.  PAST MEDICAL HISTORY: Past Medical History:  Diagnosis Date  . Cancer ([place])    lung  . Cataract   . Collagen vascular disease ([place])   . History of kidney stones   . Kidney stones 2014    PAST SURGICAL HISTORY: Past Surgical History:  Procedure Laterality Date  . CYSTOSCOPY W/ URETEROSCOPY W/ LITHOTRIPSY    . ENDOBRONCHIAL ULTRASOUND N/A 06/12/2016   Procedure: ENDOBRONCHIAL ULTRASOUND;  Surgeon: Laverle Hobby, MD;  Location: [facility] ORS;  Service: Pulmonary;  Laterality: N/A;  . PORTA CATH INSERTION N/A 06/26/2016   Procedure: Glori Luis Cath Insertion;  Surgeon: Algernon Huxley, MD;  Location: [facility];  Service: Cardiovascular;  Laterality: N/A;  . TONSILLECTOMY      FAMILY HISTORY: Family History  Problem Relation Age of Onset  . Heart disease Mother   . Heart disease Father   . Cancer Sister 6       luekemia    ADVANCED DIRECTIVES (Y/N):  N  HEALTH MAINTENANCE: Social History  Substance Use Topics  . Smoking status: Former Smoker    Packs/day: 0.50    Years: 55.00    Quit date: 03/28/2016  . Smokeless tobacco: Former Systems developer  . Alcohol use No     Comment: Not lately     Colonoscopy:  PAP:  Bone density:  Lipid panel:  No Known Allergies  Current Outpatient Prescriptions  Medication Sig Dispense Refill  . acetaminophen (TYLENOL) 500 MG tablet Take 500 mg by mouth every 8 (eight) hours as needed for mild pain or headache. On hold for procedure    . dexamethasone (DECADRON) 4 MG tablet Take 1 tablet (4 mg total) by mouth daily. 30 tablet 1  .  esomeprazole (NEXIUM) 40 MG capsule Take 1 capsule (40 mg total) by mouth daily. 30 capsule 1  . folic acid (FOLVITE) 1 MG tablet Take 1 mg by mouth daily.     Marland Kitchen lidocaine (XYLOCAINE) 2 % solution Use as directed 20 mLs in the mouth or throat as needed for mouth pain. 100 mL 0  . lidocaine-prilocaine (EMLA) cream Apply to affected area once 30 g 3  . magic mouthwash w/lidocaine SOLN  Take 5 mLs by mouth 3 (three) times daily. 300 mL 1  . megestrol (MEGACE) 40 MG tablet Take 1 tablet (40 mg total) by mouth daily. 30 tablet 1  . methotrexate (RHEUMATREX) 2.5 MG tablet Take 20 mg by mouth once a week. Wednesday    . naproxen sodium (ANAPROX) 220 MG tablet Take 220 mg by mouth 2 (two) times daily with a meal.    . ondansetron (ZOFRAN) 8 MG tablet Take 1 tablet (8 mg total) by mouth 2 (two) times daily as needed for refractory nausea / vomiting. 60 tablet 2  . oxyCODONE-acetaminophen (PERCOCET/ROXICET) 5-325 MG tablet Take 1-2 tablets by mouth every 6 (six) hours as needed for severe pain. 30 tablet 0  . prochlorperazine (COMPAZINE) 10 MG tablet Take 1 tablet (10 mg total) by mouth every 6 (six) hours as needed (Nausea or vomiting). 60 tablet 2  . sucralfate (CARAFATE) 1 g tablet Take 1 tablet (1 g total) by mouth 3 (three) times daily. Dissolve in 2-3 TBSP warm water, swish and swallow. 90 tablet 3  . tamsulosin (FLOMAX) 0.4 MG CAPS capsule Take 0.4 mg by mouth daily.     Marland Kitchen triamcinolone ointment (KENALOG) 0.5 % Apply 1 application topically 3 (three) times daily. 30 g 0   Current Facility-Administered Medications  Medication Dose Route Frequency Provider Last Rate Last Dose  . 0.9 %  sodium chloride infusion   Intravenous Continuous Lloyd Huger, MD 125 mL/hr at 09/10/16 1015     Facility-Administered Medications Ordered in Other Visits  Medication Dose Route Frequency Provider Last Rate Last Dose  . heparin lock flush 100 unit/mL  500 Units Intravenous Once Lloyd Huger, MD        OBJECTIVE: Vitals:   09/10/16 0916  BP: 101/69  Pulse: 91  Resp: 20  Temp: 100 F (37.8 C)     There is no height or weight on file to calculate BMI.    ECOG FS:2 - Symptomatic, <50% confined to bed  General: Well-developed, well-nourished, no acute distress.Sitting in a wheelchair. Eyes: Pink conjunctiva, anicteric sclera. Lungs: Clear to auscultation bilaterally. Heart:  Regular rate and rhythm. No rubs, murmurs, or gallops. Abdomen: Soft, nontender, nondistended. No organomegaly noted, normoactive bowel sounds. Musculoskeletal: No edema. Neuro: Alert, answering all questions appropriately. Cranial nerves grossly intact. Skin: No rashes or petechiae noted. Psych: Highly anxious.  LAB RESULTS:  Lab Results  Component Value Date   NA 134 (L) 09/02/2016   K 4.3 09/02/2016   CL 102 09/02/2016   CO2 26 09/02/2016   GLUCOSE 139 (H) 09/02/2016   BUN 23 (H) 09/02/2016   CREATININE 0.76 09/02/2016   CALCIUM 8.6 (L) 09/02/2016   PROT 6.4 (L) 09/02/2016   ALBUMIN 3.3 (L) 09/02/2016   AST 23 09/02/2016   ALT 24 09/02/2016   ALKPHOS 71 09/02/2016   BILITOT 1.5 (H) 09/02/2016   GFRNONAA >60 09/02/2016   GFRAA >60 09/02/2016    Lab Results  Component Value Date   WBC 2.6 (L) 09/10/2016   NEUTROABS  2.0 09/10/2016   HGB 10.0 (L) 09/10/2016   HCT 28.1 (L) 09/10/2016   MCV 92.9 09/10/2016   PLT 145 (L) 09/10/2016     STUDIES: No results found.  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  PLAN:    1. 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 MRI the brain is negative. His last XRT treatment is scheduled for September 18, 2016. Delay cycle 8 of weekly carboplatinum and Taxol secondary to declining performance status. Patient will instead receive IV fluids and admission to the hospital for failure to thrive and dehydration. Return to clinic in 1 week after discharge for reevaluation and consideration of additional treatment if possible. Continue daily XRT. Can consider maintenance treatment with Durvalumab starting approximately 6 weeks after treatment every 2 weeks for total of one year.  2. Pain: Unclear etiology, but appears to be musculoskeletal in nature. Patient was given a refill  of his Percocet last week. 3. Poor appetite/weight loss: Worse this week. Appreciate dietary input. Admission as above. 4. 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 If does not improve with improvement of his dehydration, can consider CT of the head. 5.  Constipation: Continue current treatment as prescribed. 6. Pancytopenia: Secondary to chemotherapy. Delay treatment as above. 7. Worsening confusion: Likely multifactorial. Have ordered a UA and culture for further evaluation. Patient's daughter reports he was given Cipro this weekend but is unclear if his compliance.  Cancer Staging Squamous cell lung cancer, right [facility]) Staging form: Lung, AJCC 8th Edition - Clinical stage from 06/19/2016: Stage IIIC (cT3, cN3, cM0) - Signed by Lloyd Huger, MD on 06/19/2016    Lloyd Huger, MD 09/10/16 10:42 AM

## 2016-09-10 ENCOUNTER — Inpatient Hospital Stay: Payer: Medicare Other

## 2016-09-10 ENCOUNTER — Inpatient Hospital Stay (HOSPITAL_BASED_OUTPATIENT_CLINIC_OR_DEPARTMENT_OTHER): Payer: Medicare Other | Admitting: Oncology

## 2016-09-10 ENCOUNTER — Inpatient Hospital Stay
Admission: AD | Admit: 2016-09-10 | Discharge: 2016-09-13 | DRG: 194 | Disposition: A | Discharge status: Home / Self Care | Payer: Medicare Other | Source: Ambulatory Visit | Attending: Specialist | Admitting: Specialist

## 2016-09-10 ENCOUNTER — Ambulatory Visit: Payer: Medicare Other

## 2016-09-10 VITALS — BP 101/69 | HR 91 | Temp 100.0°F | Resp 20

## 2016-09-10 DIAGNOSIS — E440 Moderate protein-calorie malnutrition: Secondary | ICD-10-CM | POA: Diagnosis present

## 2016-09-10 DIAGNOSIS — R630 Anorexia: Secondary | ICD-10-CM

## 2016-09-10 DIAGNOSIS — N400 Enlarged prostate without lower urinary tract symptoms: Secondary | ICD-10-CM | POA: Diagnosis present

## 2016-09-10 DIAGNOSIS — K5900 Constipation, unspecified: Secondary | ICD-10-CM | POA: Diagnosis present

## 2016-09-10 DIAGNOSIS — Z87442 Personal history of urinary calculi: Secondary | ICD-10-CM | POA: Diagnosis not present

## 2016-09-10 DIAGNOSIS — E871 Hypo-osmolality and hyponatremia: Secondary | ICD-10-CM | POA: Diagnosis present

## 2016-09-10 DIAGNOSIS — R410 Disorientation, unspecified: Secondary | ICD-10-CM | POA: Diagnosis not present

## 2016-09-10 DIAGNOSIS — Z8249 Family history of ischemic heart disease and other diseases of the circulatory system: Secondary | ICD-10-CM

## 2016-09-10 DIAGNOSIS — K219 Gastro-esophageal reflux disease without esophagitis: Secondary | ICD-10-CM | POA: Diagnosis present

## 2016-09-10 DIAGNOSIS — Z806 Family history of leukemia: Secondary | ICD-10-CM

## 2016-09-10 DIAGNOSIS — Z515 Encounter for palliative care: Secondary | ICD-10-CM | POA: Diagnosis not present

## 2016-09-10 DIAGNOSIS — R634 Abnormal weight loss: Secondary | ICD-10-CM | POA: Diagnosis not present

## 2016-09-10 DIAGNOSIS — R531 Weakness: Secondary | ICD-10-CM

## 2016-09-10 DIAGNOSIS — J209 Acute bronchitis, unspecified: Secondary | ICD-10-CM | POA: Diagnosis present

## 2016-09-10 DIAGNOSIS — T451X5A Adverse effect of antineoplastic and immunosuppressive drugs, initial encounter: Secondary | ICD-10-CM | POA: Diagnosis present

## 2016-09-10 DIAGNOSIS — I482 Chronic atrial fibrillation: Secondary | ICD-10-CM | POA: Diagnosis present

## 2016-09-10 DIAGNOSIS — D6481 Anemia due to antineoplastic chemotherapy: Secondary | ICD-10-CM | POA: Diagnosis present

## 2016-09-10 DIAGNOSIS — C3412 Malignant neoplasm of upper lobe, left bronchus or lung: Secondary | ICD-10-CM | POA: Diagnosis present

## 2016-09-10 DIAGNOSIS — M79606 Pain in leg, unspecified: Secondary | ICD-10-CM | POA: Diagnosis not present

## 2016-09-10 DIAGNOSIS — D61810 Antineoplastic chemotherapy induced pancytopenia: Secondary | ICD-10-CM

## 2016-09-10 DIAGNOSIS — R197 Diarrhea, unspecified: Secondary | ICD-10-CM | POA: Diagnosis not present

## 2016-09-10 DIAGNOSIS — R627 Adult failure to thrive: Secondary | ICD-10-CM | POA: Diagnosis present

## 2016-09-10 DIAGNOSIS — Z5111 Encounter for antineoplastic chemotherapy: Secondary | ICD-10-CM | POA: Diagnosis not present

## 2016-09-10 DIAGNOSIS — C3491 Malignant neoplasm of unspecified part of right bronchus or lung: Secondary | ICD-10-CM

## 2016-09-10 DIAGNOSIS — F329 Major depressive disorder, single episode, unspecified: Secondary | ICD-10-CM | POA: Diagnosis present

## 2016-09-10 DIAGNOSIS — J189 Pneumonia, unspecified organism: Secondary | ICD-10-CM | POA: Diagnosis present

## 2016-09-10 DIAGNOSIS — Z6823 Body mass index (BMI) 23.0-23.9, adult: Secondary | ICD-10-CM

## 2016-09-10 DIAGNOSIS — E860 Dehydration: Secondary | ICD-10-CM

## 2016-09-10 DIAGNOSIS — M79604 Pain in right leg: Secondary | ICD-10-CM | POA: Diagnosis present

## 2016-09-10 DIAGNOSIS — I959 Hypotension, unspecified: Secondary | ICD-10-CM

## 2016-09-10 DIAGNOSIS — D649 Anemia, unspecified: Secondary | ICD-10-CM

## 2016-09-10 DIAGNOSIS — Z87891 Personal history of nicotine dependence: Secondary | ICD-10-CM | POA: Diagnosis not present

## 2016-09-10 DIAGNOSIS — F419 Anxiety disorder, unspecified: Secondary | ICD-10-CM | POA: Diagnosis not present

## 2016-09-10 DIAGNOSIS — M2550 Pain in unspecified joint: Secondary | ICD-10-CM | POA: Diagnosis not present

## 2016-09-10 DIAGNOSIS — I495 Sick sinus syndrome: Secondary | ICD-10-CM | POA: Diagnosis present

## 2016-09-10 DIAGNOSIS — M79605 Pain in left leg: Secondary | ICD-10-CM | POA: Diagnosis present

## 2016-09-10 DIAGNOSIS — G8929 Other chronic pain: Secondary | ICD-10-CM | POA: Diagnosis present

## 2016-09-10 DIAGNOSIS — R12 Heartburn: Secondary | ICD-10-CM | POA: Diagnosis not present

## 2016-09-10 DIAGNOSIS — R509 Fever, unspecified: Secondary | ICD-10-CM

## 2016-09-10 DIAGNOSIS — R0902 Hypoxemia: Secondary | ICD-10-CM | POA: Diagnosis present

## 2016-09-10 DIAGNOSIS — Z79899 Other long term (current) drug therapy: Secondary | ICD-10-CM | POA: Diagnosis not present

## 2016-09-10 DIAGNOSIS — D61818 Other pancytopenia: Secondary | ICD-10-CM | POA: Diagnosis present

## 2016-09-10 DIAGNOSIS — C3492 Malignant neoplasm of unspecified part of left bronchus or lung: Secondary | ICD-10-CM | POA: Diagnosis not present

## 2016-09-10 DIAGNOSIS — C3490 Malignant neoplasm of unspecified part of unspecified bronchus or lung: Secondary | ICD-10-CM

## 2016-09-10 DIAGNOSIS — I998 Other disorder of circulatory system: Secondary | ICD-10-CM | POA: Diagnosis not present

## 2016-09-10 DIAGNOSIS — R5383 Other fatigue: Secondary | ICD-10-CM | POA: Diagnosis not present

## 2016-09-10 DIAGNOSIS — Z7189 Other specified counseling: Secondary | ICD-10-CM | POA: Diagnosis not present

## 2016-09-10 DIAGNOSIS — I472 Ventricular tachycardia: Secondary | ICD-10-CM | POA: Diagnosis not present

## 2016-09-10 DIAGNOSIS — J029 Acute pharyngitis, unspecified: Secondary | ICD-10-CM | POA: Diagnosis not present

## 2016-09-10 LAB — URINALYSIS, ROUTINE W REFLEX MICROSCOPIC
Bilirubin Urine: NEGATIVE
GLUCOSE, UA: NEGATIVE mg/dL
Hgb urine dipstick: NEGATIVE
Ketones, ur: NEGATIVE mg/dL
LEUKOCYTES UA: NEGATIVE
Nitrite: NEGATIVE
PROTEIN: NEGATIVE mg/dL
SPECIFIC GRAVITY, URINE: 1.016 (ref 1.005–1.030)
pH: 5 (ref 5.0–8.0)

## 2016-09-10 LAB — CBC WITH DIFFERENTIAL/PLATELET
BASOS PCT: 3 %
Basophils Absolute: 0.1 10*3/uL (ref 0–0.1)
EOS ABS: 0 10*3/uL (ref 0–0.7)
Eosinophils Relative: 1 %
HEMATOCRIT: 28.1 % — AB (ref 40.0–52.0)
Hemoglobin: 10 g/dL — ABNORMAL LOW (ref 13.0–18.0)
LYMPHS ABS: 0.2 10*3/uL — AB (ref 1.0–3.6)
Lymphocytes Relative: 7 %
MCH: 33 pg (ref 26.0–34.0)
MCHC: 35.5 g/dL (ref 32.0–36.0)
MCV: 92.9 fL (ref 80.0–100.0)
MONOS PCT: 15 %
Monocytes Absolute: 0.4 10*3/uL (ref 0.2–1.0)
NEUTROS ABS: 2 10*3/uL (ref 1.4–6.5)
NEUTROS PCT: 76 %
Platelets: 145 10*3/uL — ABNORMAL LOW (ref 150–440)
RBC: 3.03 MIL/uL — AB (ref 4.40–5.90)
RDW: 18.4 % — ABNORMAL HIGH (ref 11.5–14.5)
WBC: 2.6 10*3/uL — AB (ref 3.8–10.6)

## 2016-09-10 LAB — COMPREHENSIVE METABOLIC PANEL
ALBUMIN: 2.7 g/dL — AB (ref 3.5–5.0)
ALK PHOS: 59 U/L (ref 38–126)
ALT: 23 U/L (ref 17–63)
ANION GAP: 7 (ref 5–15)
AST: 27 U/L (ref 15–41)
BILIRUBIN TOTAL: 1 mg/dL (ref 0.3–1.2)
BUN: 22 mg/dL — AB (ref 6–20)
CALCIUM: 8.2 mg/dL — AB (ref 8.9–10.3)
CO2: 23 mmol/L (ref 22–32)
CREATININE: 0.73 mg/dL (ref 0.61–1.24)
Chloride: 97 mmol/L — ABNORMAL LOW (ref 101–111)
GFR calc Af Amer: >60 mL/min (ref >60)
GFR calc non Af Amer: >60 mL/min (ref >60)
GLUCOSE: 148 mg/dL — AB (ref 65–99)
Potassium: 3.9 mmol/L (ref 3.5–5.1)
SODIUM: 127 mmol/L — AB (ref 135–145)
TOTAL PROTEIN: 5.8 g/dL — AB (ref 6.5–8.1)

## 2016-09-10 MED ORDER — ONDANSETRON HCL 4 MG PO TABS: 4.0000 mg | ORAL_TABLET | Freq: Four times a day (QID) | ORAL | Status: DC | PRN

## 2016-09-10 MED ORDER — MAGIC MOUTHWASH
5.0000 mL | Freq: Three times a day (TID) | ORAL | Status: DC
  Filled 2016-09-10 (×2): qty 10

## 2016-09-10 MED ORDER — BISACODYL 5 MG PO TBEC: 5.0000 mg | DELAYED_RELEASE_TABLET | Freq: Every day | ORAL | Status: DC | PRN

## 2016-09-10 MED ORDER — SODIUM CHLORIDE 0.9 % IV BOLUS (SEPSIS)
1000.0000 mL | Freq: Once | INTRAVENOUS | Status: AC
  Administered 2016-09-10: 23:00:00 1000 mL via INTRAVENOUS

## 2016-09-10 MED ORDER — LIDOCAINE VISCOUS 2 % MT SOLN: 20.0000 mL | OROMUCOSAL | Status: DC | PRN

## 2016-09-10 MED ORDER — DOCUSATE SODIUM 100 MG PO CAPS
100.0000 mg | ORAL_CAPSULE | Freq: Two times a day (BID) | ORAL | Status: DC
  Administered 2016-09-10 – 2016-09-13 (×7): 100 mg via ORAL
  Filled 2016-09-10 (×7): qty 1

## 2016-09-10 MED ORDER — SODIUM CHLORIDE 0.9 % IV SOLN
INTRAVENOUS | Status: DC
  Administered 2016-09-10: 14:00:00 via INTRAVENOUS

## 2016-09-10 MED ORDER — MAGIC MOUTHWASH W/LIDOCAINE
5.0000 mL | Freq: Three times a day (TID) | ORAL | Status: DC
Start: 2016-09-10 — End: 2016-09-10

## 2016-09-10 MED ORDER — TAMSULOSIN HCL 0.4 MG PO CAPS
0.4000 mg | ORAL_CAPSULE | Freq: Every day | ORAL | Status: DC
  Administered 2016-09-10 – 2016-09-13 (×4): 0.4 mg via ORAL
  Filled 2016-09-10 (×4): qty 1

## 2016-09-10 MED ORDER — DEXAMETHASONE 4 MG PO TABS
4.0000 mg | ORAL_TABLET | Freq: Every day | ORAL | Status: DC
  Administered 2016-09-10 – 2016-09-13 (×4): 4 mg via ORAL
  Filled 2016-09-10 (×5): qty 1

## 2016-09-10 MED ORDER — PROCHLORPERAZINE MALEATE 10 MG PO TABS
10.0000 mg | ORAL_TABLET | Freq: Four times a day (QID) | ORAL | Status: DC | PRN
  Filled 2016-09-10: qty 1

## 2016-09-10 MED ORDER — ACETAMINOPHEN 650 MG RE SUPP: 650.0000 mg | Freq: Four times a day (QID) | RECTAL | Status: DC | PRN

## 2016-09-10 MED ORDER — MEGESTROL ACETATE 40 MG PO TABS
40.0000 mg | ORAL_TABLET | Freq: Every day | ORAL | Status: DC
  Administered 2016-09-10 – 2016-09-13 (×4): 40 mg via ORAL
  Filled 2016-09-10 (×4): qty 1

## 2016-09-10 MED ORDER — HEPARIN SOD (PORK) LOCK FLUSH 100 UNIT/ML IV SOLN: 500.0000 [IU] | Freq: Once | INTRAVENOUS | Status: DC

## 2016-09-10 MED ORDER — LIDOCAINE VISCOUS 2 % MT SOLN
5.0000 mL | Freq: Three times a day (TID) | OROMUCOSAL | Status: DC
  Filled 2016-09-10 (×2): qty 15

## 2016-09-10 MED ORDER — SODIUM CHLORIDE 0.9% FLUSH
10.0000 mL | INTRAVENOUS | Status: AC | PRN
  Administered 2016-09-10: 14:00:00 10 mL

## 2016-09-10 MED ORDER — FOLIC ACID 1 MG PO TABS
1.0000 mg | ORAL_TABLET | Freq: Every day | ORAL | Status: DC
  Administered 2016-09-10 – 2016-09-13 (×4): 1 mg via ORAL
  Filled 2016-09-10 (×4): qty 1

## 2016-09-10 MED ORDER — ONDANSETRON HCL 4 MG PO TABS: 8.0000 mg | ORAL_TABLET | Freq: Two times a day (BID) | ORAL | Status: DC | PRN

## 2016-09-10 MED ORDER — NAPROXEN 250 MG PO TABS
250.0000 mg | ORAL_TABLET | Freq: Two times a day (BID) | ORAL | Status: DC
  Administered 2016-09-10: 18:00:00 250 mg via ORAL
  Filled 2016-09-10 (×2): qty 1

## 2016-09-10 MED ORDER — SODIUM CHLORIDE 0.9 % IV SOLN
INTRAVENOUS | Status: DC
Start: 2016-09-10 — End: 2016-09-10
  Administered 2016-09-10: 10:00:00 via INTRAVENOUS
  Filled 2016-09-10: qty 1000

## 2016-09-10 MED ORDER — ONDANSETRON HCL 4 MG/2ML IJ SOLN: 4.0000 mg | Freq: Four times a day (QID) | INTRAMUSCULAR | Status: DC | PRN

## 2016-09-10 MED ORDER — ACETAMINOPHEN 325 MG PO TABS
650.0000 mg | ORAL_TABLET | Freq: Four times a day (QID) | ORAL | Status: DC | PRN
  Administered 2016-09-10: 650 mg via ORAL
  Filled 2016-09-10: qty 2

## 2016-09-10 MED ORDER — SODIUM CHLORIDE 0.9% FLUSH
10.0000 mL | Freq: Once | INTRAVENOUS | Status: AC
  Administered 2016-09-10: 10 mL via INTRAVENOUS
  Filled 2016-09-10: qty 10

## 2016-09-10 MED ORDER — ORAL CARE MOUTH RINSE
15.0000 mL | Freq: Two times a day (BID) | OROMUCOSAL | Status: DC
  Administered 2016-09-10 – 2016-09-12 (×4): 15 mL via OROMUCOSAL

## 2016-09-10 MED ORDER — PANTOPRAZOLE SODIUM 40 MG PO TBEC
40.0000 mg | DELAYED_RELEASE_TABLET | Freq: Every day | ORAL | Status: DC
  Administered 2016-09-10 – 2016-09-13 (×4): 40 mg via ORAL
  Filled 2016-09-10 (×4): qty 1

## 2016-09-10 NOTE — Progress Notes (Signed)
Patient and family report patient is not eating or drinking well at home. Patient reports weakness, leg pain.

## 2016-09-10 NOTE — H&P (Signed)
[place] [facility] [place] NAME: Frank Cowan    MR#:  [id number]  DATE OF BIRTH:  06/30/1940  DATE OF ADMISSION:  (Not on file)  PRIMARY CARE PHYSICIAN: Claiborne Billings, MD   REQUESTING/REFERRING PHYSICIAN: Lloyd Huger, MD  CHIEF COMPLAINT:  No chief complaint on file. weakness HISTORY OF PRESENT ILLNESS:  Frank Cowan  is a 76 y.o. 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 Per family over the past week his performance status has significantly declined. He has minimal PO intake and complains of worsening constipation. He continues to have significant bilateral leg pain that makes it difficult to walk. He has worsening weakness and fatigue. He continues to be highly anxious. He also has a low-grade fever. His daughter reports worsening confusion. He was recently called in Cipro from cancer center for possible UTI, doubt he has taken it yet. He is being directly admitted from cancer center for failure to thrive, dehydration and for further evaluation. PAST MEDICAL HISTORY:   Past Medical History:  Diagnosis Date  . Cancer ([place])    lung  . Cataract   . Collagen vascular disease ([place])   . History of kidney stones   . Kidney stones 2014    PAST SURGICAL HISTORY:   Past Surgical History:  Procedure Laterality Date  . CYSTOSCOPY W/ URETEROSCOPY W/ LITHOTRIPSY    . ENDOBRONCHIAL ULTRASOUND N/A 06/12/2016   Procedure: ENDOBRONCHIAL ULTRASOUND;  Surgeon: Laverle Hobby, MD;  Location: [facility] ORS;  Service: Pulmonary;  Laterality: N/A;  . PORTA CATH INSERTION N/A 06/26/2016   Procedure: Glori Luis Cath Insertion;  Surgeon: Algernon Huxley, MD;  Location: [place] [facility];  Service: Cardiovascular;  Laterality: N/A;  . TONSILLECTOMY      SOCIAL HISTORY:   Social History    Substance Use Topics  . Smoking status: Former Smoker    Packs/day: 0.50    Years: 55.00    Quit date: 03/28/2016  . Smokeless tobacco: Former Systems developer  . Alcohol use No     Comment: Not lately    FAMILY HISTORY:   Family History  Problem Relation Age of Onset  . Heart disease Mother   . Heart disease Father   . Cancer Sister 6       luekemia    DRUG ALLERGIES:  No Known Allergies  REVIEW OF SYSTEMS:   Review of Systems  Constitutional: Positive for fever, malaise/fatigue and weight loss. Negative for chills.  HENT: Negative for nosebleeds and sore throat.   Eyes: Negative for blurred vision.  Respiratory: Negative for cough, shortness of breath and wheezing.   Cardiovascular: Negative for chest pain, orthopnea, leg swelling and PND.  Gastrointestinal: Negative for abdominal pain, constipation, diarrhea, heartburn, nausea and vomiting.  Genitourinary: Negative for dysuria and urgency.  Musculoskeletal: Negative for back pain.  Skin: Negative for rash.  Neurological: Positive for weakness. Negative for dizziness, speech change, focal weakness and headaches.  Endo/Heme/Allergies: Does not bruise/bleed easily.  Psychiatric/Behavioral: Negative for depression.   MEDICATIONS AT HOME:   Prior to Admission medications   Medication Sig Start Date End Date Taking? Authorizing Provider  acetaminophen (TYLENOL) 500 MG tablet Take 500 mg by mouth every 8 (eight) hours as needed for mild pain or headache. On hold for procedure  [provider]  dexamethasone (DECADRON) 4 MG tablet Take 1 tablet (4 mg total) by mouth daily. 08/13/16   Lloyd Huger, MD  esomeprazole (NEXIUM) 40 MG capsule Take 1 capsule (40 mg total) by mouth daily. 08/02/16 08/02/17  Earleen [place], MD  folic acid (FOLVITE) 1 MG tablet Take 1 mg by mouth daily.  09/13/13   [provider]  lidocaine (XYLOCAINE) 2 % solution Use as directed 20 mLs in the mouth or throat as needed for mouth  pain. 08/02/16   Earleen [place], MD  lidocaine-prilocaine (EMLA) cream Apply to affected area once 06/22/16   Lloyd Huger, MD  magic mouthwash w/lidocaine SOLN Take 5 mLs by mouth 3 (three) times daily. 08/13/16   Lloyd Huger, MD  megestrol (MEGACE) 40 MG tablet Take 1 tablet (40 mg total) by mouth daily. 06/30/16   Jacquelin Hawking, NP  methotrexate (RHEUMATREX) 2.5 MG tablet Take 20 mg by mouth once a week. Wednesday 05/14/16   [provider]  naproxen sodium (ANAPROX) 220 MG tablet Take 220 mg by mouth 2 (two) times daily with a meal.    [provider]  ondansetron (ZOFRAN) 8 MG tablet Take 1 tablet (8 mg total) by mouth 2 (two) times daily as needed for refractory nausea / vomiting. 06/22/16   Lloyd Huger, MD  oxyCODONE-acetaminophen (PERCOCET/ROXICET) 5-325 MG tablet Take 1-2 tablets by mouth every 6 (six) hours as needed for severe pain. 09/02/16   Lloyd Huger, MD  prochlorperazine (COMPAZINE) 10 MG tablet Take 1 tablet (10 mg total) by mouth every 6 (six) hours as needed (Nausea or vomiting). 06/22/16   Lloyd Huger, MD  sucralfate (CARAFATE) 1 g tablet Take 1 tablet (1 g total) by mouth 3 (three) times daily. Dissolve in 2-3 TBSP warm water, swish and swallow. 07/22/16   Noreene Filbert, MD  tamsulosin (FLOMAX) 0.4 MG CAPS capsule Take 0.4 mg by mouth daily.  05/13/16 05/13/17  [provider]  triamcinolone ointment (KENALOG) 0.5 % Apply 1 application topically 3 (three) times daily. 08/02/16   Earleen Newport, MD      VITAL SIGNS:  There were no vitals taken for this visit.  PHYSICAL EXAMINATION:  Physical Exam  GENERAL:  76 y.o.-year-old patient lying in the bed with no acute distress.  EYES: Pupils equal, round, reactive to light and accommodation. No scleral icterus. Extraocular muscles intact.  HEENT: Head atraumatic, normocephalic. Oropharynx and nasopharynx clear.  NECK:  Supple, no jugular venous distention.  No thyroid enlargement, no tenderness.  LUNGS: Normal breath sounds bilaterally, no wheezing, rales,rhonchi or crepitation. No use of accessory muscles of respiration.  CARDIOVASCULAR: S1, S2 normal. No murmurs, rubs, or gallops.  ABDOMEN: Soft, nontender, nondistended. Bowel sounds present. No organomegaly or mass.  EXTREMITIES: No pedal edema, cyanosis, or clubbing.  NEUROLOGIC: Cranial nerves II through XII are intact. Muscle strength 5/5 in all extremities. Sensation intact. Gait not checked.  PSYCHIATRIC: The patient is alert and oriented x 3.  SKIN: No obvious rash, lesion, or ulcer.   LABORATORY PANEL:   CBC  Recent Labs Lab 09/10/16 1014  WBC 2.6*  HGB 10.0*  HCT 28.1*  PLT 145*   ------------------------------------------------------------------------------------------------------------------  Chemistries   Recent Labs Lab 09/10/16 1014  NA 127*  K 3.9  CL 97*  CO2 23  GLUCOSE 148*  BUN 22*  CREATININE 0.73  CALCIUM 8.2*  AST 27  ALT 23  ALKPHOS 59  BILITOT 1.0  IMPRESSION AND PLAN:  53 y m with known Sq cell CA of lung is being admitted for dehydration, failure to thrive and poor PO intake, confusion  * Dehydration/Hyponatremia: aggressive IVF hydration and monitor - likely due to poor PO intake - monitor Na  * Confusion/forgetfullness: could be due to narcotics - possible underlying mild dementia per onco  - hold narcotics if possible as this may be contributing to his consitpation  * 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  - His last XRT treatment is scheduled for September 18, 2016.  - Followed by Dr Grayland Ormond at cancer center  * Chronic pain - avoid narcotics if possible as lots of symptoms can be due to this - try NSAIDs if can tolerate  * Easy bruising: Resolved. Platelet count remains decreased, but stable.   * Constipation: Patient was recommended to try a stool softener once a  day in attempt for more regular bowel movements. - avoid narcotics  * Anemia: Mild, monitor. Secondary to chemotherapy.  * Weakness - get PT, OT eval   All the records are reviewed and case discussed with Dr Grayland Ormond. Management plans discussed with the patient, family and they are in agreement.  CODE STATUS: FULL CODE  TOTAL TIME TAKING CARE OF THIS PATIENT: 45 minutes.    Max Sane M.D on 09/10/2016 at 11:47 AM  Between 7am to 6pm - Pager - [phone]  After 6pm go to [email or web address] - Proofreader  [facility]  Office  [phone]  CC: Primary care physician; Claiborne Billings, MD   Note: This dictation was prepared with Dragon dictation along with smaller phrase technology. Any transcriptional errors that result from this process are unintentional.

## 2016-09-11 ENCOUNTER — Inpatient Hospital Stay: Payer: Medicare Other

## 2016-09-11 ENCOUNTER — Ambulatory Visit: Payer: Medicare Other

## 2016-09-11 ENCOUNTER — Inpatient Hospital Stay: Payer: Medicare Other | Admitting: Oncology

## 2016-09-11 ENCOUNTER — Encounter: Payer: Self-pay | Admitting: Radiology

## 2016-09-11 DIAGNOSIS — Z806 Family history of leukemia: Secondary | ICD-10-CM

## 2016-09-11 DIAGNOSIS — R630 Anorexia: Secondary | ICD-10-CM

## 2016-09-11 DIAGNOSIS — Z7901 Long term (current) use of anticoagulants: Secondary | ICD-10-CM

## 2016-09-11 DIAGNOSIS — Z79899 Other long term (current) drug therapy: Secondary | ICD-10-CM

## 2016-09-11 DIAGNOSIS — F419 Anxiety disorder, unspecified: Secondary | ICD-10-CM

## 2016-09-11 DIAGNOSIS — R531 Weakness: Secondary | ICD-10-CM

## 2016-09-11 DIAGNOSIS — M791 Myalgia: Secondary | ICD-10-CM

## 2016-09-11 DIAGNOSIS — E860 Dehydration: Secondary | ICD-10-CM

## 2016-09-11 DIAGNOSIS — Z87891 Personal history of nicotine dependence: Secondary | ICD-10-CM

## 2016-09-11 DIAGNOSIS — M79606 Pain in leg, unspecified: Secondary | ICD-10-CM

## 2016-09-11 DIAGNOSIS — R410 Disorientation, unspecified: Secondary | ICD-10-CM

## 2016-09-11 DIAGNOSIS — R634 Abnormal weight loss: Secondary | ICD-10-CM

## 2016-09-11 DIAGNOSIS — F329 Major depressive disorder, single episode, unspecified: Secondary | ICD-10-CM

## 2016-09-11 DIAGNOSIS — R509 Fever, unspecified: Secondary | ICD-10-CM

## 2016-09-11 DIAGNOSIS — D61818 Other pancytopenia: Secondary | ICD-10-CM

## 2016-09-11 DIAGNOSIS — R627 Adult failure to thrive: Secondary | ICD-10-CM

## 2016-09-11 DIAGNOSIS — R5383 Other fatigue: Secondary | ICD-10-CM

## 2016-09-11 DIAGNOSIS — C3412 Malignant neoplasm of upper lobe, left bronchus or lung: Secondary | ICD-10-CM

## 2016-09-11 LAB — CBC
HCT: 25.9 % — ABNORMAL LOW (ref 40.0–52.0)
HEMOGLOBIN: 9 g/dL — AB (ref 13.0–18.0)
MCH: 32.6 pg (ref 26.0–34.0)
MCHC: 34.8 g/dL (ref 32.0–36.0)
MCV: 93.6 fL (ref 80.0–100.0)
Platelets: 136 10*3/uL — ABNORMAL LOW (ref 150–440)
RBC: 2.77 MIL/uL — AB (ref 4.40–5.90)
RDW: 18 % — ABNORMAL HIGH (ref 11.5–14.5)
WBC: 1.9 10*3/uL — ABNORMAL LOW (ref 3.8–10.6)

## 2016-09-11 LAB — BASIC METABOLIC PANEL
ANION GAP: 4 — AB (ref 5–15)
BUN: 19 mg/dL (ref 6–20)
CHLORIDE: 107 mmol/L (ref 101–111)
CO2: 24 mmol/L (ref 22–32)
Calcium: 8 mg/dL — ABNORMAL LOW (ref 8.9–10.3)
Creatinine, Ser: 0.59 mg/dL — ABNORMAL LOW (ref 0.61–1.24)
GFR calc non Af Amer: >60 mL/min (ref >60)
Glucose, Bld: 133 mg/dL — ABNORMAL HIGH (ref 65–99)
Potassium: 3.9 mmol/L (ref 3.5–5.1)
Sodium: 135 mmol/L (ref 135–145)

## 2016-09-11 LAB — URINALYSIS, COMPLETE (UACMP) WITH MICROSCOPIC
BILIRUBIN URINE: NEGATIVE
Bacteria, UA: NONE SEEN
GLUCOSE, UA: NEGATIVE mg/dL
HGB URINE DIPSTICK: NEGATIVE
Ketones, ur: NEGATIVE mg/dL
LEUKOCYTES UA: NEGATIVE
NITRITE: NEGATIVE
Protein, ur: NEGATIVE mg/dL
SPECIFIC GRAVITY, URINE: 1.017 (ref 1.005–1.030)
pH: 5 (ref 5.0–8.0)

## 2016-09-11 LAB — DIFFERENTIAL
Basophils Absolute: 0 10*3/uL (ref 0–0.1)
Basophils Relative: 0 %
EOS ABS: 0 10*3/uL (ref 0–0.7)
EOS PCT: 0 %
LYMPHS ABS: 0.1 10*3/uL — AB (ref 1.0–3.6)
Lymphocytes Relative: 6 %
MONO ABS: 0.2 10*3/uL (ref 0.2–1.0)
MONOS PCT: 10 %
NEUTROS PCT: 84 %
Neutro Abs: 1.7 10*3/uL (ref 1.4–6.5)

## 2016-09-11 LAB — TSH: TSH: 0.375 u[IU]/mL (ref 0.350–4.500)

## 2016-09-11 LAB — GLUCOSE, CAPILLARY: GLUCOSE-CAPILLARY: 132 mg/dL — AB (ref 65–99)

## 2016-09-11 MED ORDER — ENOXAPARIN SODIUM 40 MG/0.4ML ~~LOC~~ SOLN
40.0000 mg | SUBCUTANEOUS | Status: DC
  Administered 2016-09-11 – 2016-09-12 (×2): 40 mg via SUBCUTANEOUS
  Filled 2016-09-11 (×2): qty 0.4

## 2016-09-11 MED ORDER — DEXTROSE 5 % IV SOLN
500.0000 mg | INTRAVENOUS | Status: DC
  Administered 2016-09-11 – 2016-09-12 (×2): 500 mg via INTRAVENOUS
  Filled 2016-09-11 (×3): qty 500

## 2016-09-11 MED ORDER — MIRTAZAPINE 15 MG PO TABS
7.5000 mg | ORAL_TABLET | Freq: Every day | ORAL | Status: DC
  Administered 2016-09-11 – 2016-09-12 (×2): 7.5 mg via ORAL
  Filled 2016-09-11 (×2): qty 1

## 2016-09-11 MED ORDER — IOPAMIDOL (ISOVUE-370) INJECTION 76%
75.0000 mL | Freq: Once | INTRAVENOUS | Status: AC | PRN
  Administered 2016-09-11: 17:00:00 75 mL via INTRAVENOUS

## 2016-09-11 MED ORDER — SERTRALINE HCL 50 MG PO TABS
25.0000 mg | ORAL_TABLET | Freq: Every day | ORAL | Status: DC
  Administered 2016-09-11 – 2016-09-12 (×2): 25 mg via ORAL
  Filled 2016-09-11 (×2): qty 1

## 2016-09-11 MED ORDER — SODIUM CHLORIDE 0.9 % IV BOLUS (SEPSIS)
1000.0000 mL | Freq: Once | INTRAVENOUS | Status: AC
  Administered 2016-09-11: 10:00:00 1000 mL via INTRAVENOUS

## 2016-09-11 MED ORDER — DEXTROSE 5 % IV SOLN
2.0000 g | Freq: Three times a day (TID) | INTRAVENOUS | Status: DC
  Administered 2016-09-11 – 2016-09-13 (×7): 2 g via INTRAVENOUS
  Filled 2016-09-11 (×9): qty 2

## 2016-09-11 MED ORDER — ALPRAZOLAM 0.25 MG PO TABS: 0.1250 mg | ORAL_TABLET | Freq: Three times a day (TID) | ORAL | Status: DC | PRN

## 2016-09-11 MED ORDER — ENSURE ENLIVE PO LIQD
237.0000 mL | Freq: Two times a day (BID) | ORAL | Status: DC
  Administered 2016-09-11 – 2016-09-12 (×2): 237 mL via ORAL

## 2016-09-11 MED ORDER — POTASSIUM CHLORIDE IN NACL 20-0.9 MEQ/L-% IV SOLN
INTRAVENOUS | Status: DC
  Administered 2016-09-11 – 2016-09-13 (×3): via INTRAVENOUS
  Filled 2016-09-11 (×8): qty 1000

## 2016-09-11 NOTE — Consult Note (Signed)
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  INTERVAL HISTORY: Patient initially evaluated in the Center yesterday for consideration of his weekly carboplatinum and Taxol. Treatment was canceled and patient was subsequently admitted for failure to thrive, leg pain, and dehydration. Also having low-grade fevers. Since admission he has significantly improved. He does not complain of pain today. He continues to have weakness and fatigue, but this is also improved. He is highly anxious. Previously, his daughter reported worsening confusion although he is answering questions appropriately today. He has no chest pain, shortness of breath, cough, or hemoptysis. He denies any further nausea or vomiting. He denies any diarrhea. He has no urinary complaints. Patient offers no further specific complaints today.  REVIEW OF SYSTEMS:   Review of Systems  Constitutional: Positive for fever and malaise/fatigue. Negative for weight loss.  Respiratory: Negative for cough and shortness of breath.   Cardiovascular: Negative.  Negative for chest pain and leg swelling.  Gastrointestinal: Negative.  Negative for abdominal pain.  Genitourinary: Negative.   Musculoskeletal: Positive for myalgias.  Skin: Negative.   Neurological: Positive for weakness.  Psychiatric/Behavioral: Positive for depression. The patient is nervous/anxious.     As per HPI. Otherwise, a complete review of systems is negative.  PAST MEDICAL HISTORY: Past Medical History:  Diagnosis Date  . Cancer ([place])    lung  . Cataract   . Collagen vascular disease ([place])   . History of kidney stones   . Kidney stones 2014    PAST SURGICAL HISTORY: Past Surgical  History:  Procedure Laterality Date  . CYSTOSCOPY W/ URETEROSCOPY W/ LITHOTRIPSY    . ENDOBRONCHIAL ULTRASOUND N/A 06/12/2016   Procedure: ENDOBRONCHIAL ULTRASOUND;  Surgeon: Laverle Hobby, MD;  Location: [facility] ORS;  Service: Pulmonary;  Laterality: N/A;  . PORTA CATH INSERTION N/A 06/26/2016   Procedure: Glori Luis Cath Insertion;  Surgeon: Algernon Huxley, MD;  Location: [facility];  Service: Cardiovascular;  Laterality: N/A;  . TONSILLECTOMY      FAMILY HISTORY: Family History  Problem Relation Age of Onset  . Heart disease Mother   . Heart disease Father   . Cancer Sister 6       luekemia    ADVANCED DIRECTIVES (Y/N):  @ADVDIR @  HEALTH MAINTENANCE: Social History  Substance Use Topics  . Smoking status: Former Smoker    Packs/day: 0.50    Years: 55.00    Quit date: 03/28/2016  . Smokeless tobacco: Former Systems developer  . Alcohol use No     Comment: Not lately     Colonoscopy:  PAP:  Bone density:  Lipid panel:  No Known Allergies  Current Facility-Administered Medications  Medication Dose Route Frequency Provider Last Rate Last Dose  . 0.9 % NaCl with KCl 20 mEq/ L  infusion   Intravenous Continuous Theodoro Grist, MD 125 mL/hr at 09/11/16 1345    . acetaminophen (TYLENOL) tablet 650 mg  650 mg Oral Q6H PRN Max Sane, MD   650 mg at 09/10/16 1406   Or  . acetaminophen (TYLENOL) suppository 650 mg  650 mg Rectal Q6H PRN Max Sane, MD      . ALPRAZolam Duanne Moron) tablet 0.125 mg  0.125 mg Oral TID PRN Theodoro Grist, MD      .  bisacodyl (DULCOLAX) EC tablet 5 mg  5 mg Oral Daily PRN Manuella Ghazi, Vipul, MD      . ceFEPIme (MAXIPIME) 2 g in dextrose 5 % 50 mL IVPB  2 g Intravenous Q8H Hallaji, Sheema M, RPH   Stopped at 09/11/16 1014  . dexamethasone (DECADRON) tablet 4 mg  4 mg Oral Daily Max Sane, MD   4 mg at 09/11/16 0953  . docusate sodium (COLACE) capsule 100 mg  100 mg Oral BID Max Sane, MD   100 mg at 09/11/16 0943  . enoxaparin (LOVENOX) injection 40 mg  40 mg  Subcutaneous Q24H Vaickute, Rima, MD      . feeding supplement (ENSURE ENLIVE) (ENSURE ENLIVE) liquid 237 mL  237 mL Oral BID BM Ether Griffins, Rima, MD   237 mL at 09/11/16 1400  . folic acid (FOLVITE) tablet 1 mg  1 mg Oral Daily Max Sane, MD   1 mg at 09/11/16 0942  . MEDLINE mouth rinse  15 mL Mouth Rinse BID Max Sane, MD   15 mL at 09/11/16 0954  . megestrol (MEGACE) tablet 40 mg  40 mg Oral Daily Max Sane, MD   40 mg at 09/11/16 0943  . mirtazapine (REMERON) tablet 7.5 mg  7.5 mg Oral QHS Theodoro Grist, MD      . ondansetron (ZOFRAN) tablet 4 mg  4 mg Oral Q6H PRN Max Sane, MD       Or  . ondansetron (ZOFRAN) injection 4 mg  4 mg Intravenous Q6H PRN Max Sane, MD      . ondansetron (ZOFRAN) tablet 8 mg  8 mg Oral BID PRN Max Sane, MD      . pantoprazole (PROTONIX) EC tablet 40 mg  40 mg Oral Daily Max Sane, MD   40 mg at 09/11/16 0943  . prochlorperazine (COMPAZINE) tablet 10 mg  10 mg Oral Q6H PRN Max Sane, MD      . sertraline (ZOLOFT) tablet 25 mg  25 mg Oral QHS Vaickute, Rima, MD      . tamsulosin (FLOMAX) capsule 0.4 mg  0.4 mg Oral Daily Manuella Ghazi, Vipul, MD   0.4 mg at 09/11/16 0941    OBJECTIVE: Vitals:   09/11/16 1228 09/11/16 1354  BP: (!) 85/55 (!) 84/52  Pulse:  84  Resp:    Temp:       Body mass index is 23.45 kg/m.    ECOG FS:1 - Symptomatic but completely ambulatory  General: Well-developed, well-nourished, no acute distress. Eyes: Pink conjunctiva, anicteric sclera. HEENT: Normocephalic, moist mucous membranes, clear oropharnyx. Lungs: Clear to auscultation bilaterally. Heart: Regular rate and rhythm. No rubs, murmurs, or gallops. Abdomen: Soft, nontender, nondistended. No organomegaly noted, normoactive bowel sounds. Musculoskeletal: No edema, cyanosis, or clubbing. Neuro: Alert, answering all questions appropriately. Cranial nerves grossly intact. Skin: No rashes or petechiae noted. Psych: Highly anxious. Lymphatics: No cervical, calvicular,  axillary or inguinal LAD.   LAB RESULTS:  Lab Results  Component Value Date   NA 135 09/11/2016   K 3.9 09/11/2016   CL 107 09/11/2016   CO2 24 09/11/2016   GLUCOSE 133 (H) 09/11/2016   BUN 19 09/11/2016   CREATININE 0.59 (L) 09/11/2016   CALCIUM 8.0 (L) 09/11/2016   PROT 5.8 (L) 09/10/2016   ALBUMIN 2.7 (L) 09/10/2016   AST 27 09/10/2016   ALT 23 09/10/2016   ALKPHOS 59 09/10/2016   BILITOT 1.0 09/10/2016   GFRNONAA >60 09/11/2016   GFRAA >60 09/11/2016    Lab Results  Component Value Date   WBC 1.9 (L) 09/11/2016   NEUTROABS 1.7 09/11/2016   HGB 9.0 (L) 09/11/2016   HCT 25.9 (L) 09/11/2016   MCV 93.6 09/11/2016   PLT 136 (L) 09/11/2016     STUDIES: No results found.  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  PLAN:    1. 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 MRI the brain is negative. His last XRT treatment is scheduled for September 18, 2016. Cycle 8 of weekly carboplatinum and Taxol was delayed yesterday secondary to declining performance status. Return to clinic in 1 week after discharge for reevaluation and consideration of additional treatment if possible. Continue daily XRT. Can consider maintenance treatment with Durvalumab starting approximately 6 weeks after treatment every 2 weeks for total of one year.  2. Pain: Unclear etiology, but appears to be musculoskeletal in nature. Improved since admission.  3. Poor appetite/weight loss: Patient's PO intake has improved and he is eating a full breakfast. 4. 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 Possibly secondary to narcotic use. Also has improved with IV fluids. 5. Fevers: No obvious source of infection. Agree with current antibiotics.  6.  Pancytopenia: Secondary to chemotherapy. Delay treatment as above.  Appreciate consult, will follow.   Cancer Staging Squamous cell lung cancer, right [facility]) Staging form: Lung, AJCC 8th Edition - Clinical stage from 06/19/2016: Stage IIIC (cT3, cN3, cM0) - Signed by Lloyd Huger, MD on 06/19/2016   Lloyd Huger, MD   09/11/2016 5:02 PM

## 2016-09-11 NOTE — Plan of Care (Signed)
Problem: Physical Regulation: Goal: Ability to maintain clinical measurements within normal limits will improve Outcome: Progressing In AM SBP in the 80's, rest of VSS, asymptomatic. Dr Ether Griffins notified. 1L bolus given with no improvement. Orthostatics negative. Cardiac monitor ordered, A.fib on the monitor, HR was up to 150 and low in the 30's momentarily. CT angio chest ordered. Cardiology consult and ECHO pending. Last BP 120/71, HR 67.

## 2016-09-11 NOTE — Progress Notes (Signed)
Initial Nutrition Assessment  DOCUMENTATION CODES:   Severe malnutrition in context of chronic illness  INTERVENTION:  Recommend liberalizing diet from Heart Healthy to Regular.  Provide Ensure Enlive po BID, each supplement provides 350 kcal and 20 grams of protein. Patient enjoys chocolate, vanilla, or strawberry.   Encouraged intake of adequate calories and protein at meals. Discussed options patient will enjoy here.  NUTRITION DIAGNOSIS:   Malnutrition (Severe) related to chronic illness (stg III SCC of lung on chemo and XRT) as evidenced by moderate depletions of muscle mass, severe depletion of muscle mass, moderate depletion of body fat, severe depletion of body fat.  GOAL:   Patient will meet greater than or equal to 90% of their needs  MONITOR:   PO intake, Supplement acceptance, Labs, Weight trends, I & O's  REASON FOR ASSESSMENT:   Malnutrition Screening Tool    ASSESSMENT:   76 year old male with PMHx of RA, hx of kidney stones, stage III squamous cell carcinoma of left upper lobe on chemotherapy and XRT presents with weakness, fatigue, bilateral leg pain, constipation and admitted with FTT, dehydration.   -Cycle 8 of carboplatinum and Taxol was delayed. Per chart last XRT treatment scheduled for 09/18/2016.  -Patient is being followed by outpatient RD at cancer center.  Spoke with patient at bedside. No family members present at time of assessment. He reports his appetite is not great. He is the caregiver for his wife at home, so he prepares all of the meals. He and his wife live alone, but he reports he has some family nearby who have been helping out with meal preparation lately. Patient reports this past week he has been having leg pain and cannot walk anymore. He typically eats two meals per day. For a late breakfast he will have sausage or bacon with eggs and toast. Early dinner is usually a hot dog or may be pinto beans, green beans, and mashed potatoes. He will  also drink milk at that meal. Reports he has been drinking two Ensure per day lately (unsure which type).   Patient reports his UBW was 219-229 lbs. He started losing weight about 1.5 years ago. Per weights in Care Everywhere patient was 205 lbs in 03/2015. Recently patient was 186.5 lbs on 06/19/2016 and has lost 13.6 lbs (7.3% body weight) over almost 3 months, which is not quite significant for time frame.  Meal Completion: 75% of breakfast this morning (555 kcal, 18 grams of protein)  Medications reviewed and include: Decadron 4 mg daily, Colace, folic acid 1 mg daily, Megace 40 mg daily, Remeron, pantoprazole, NS with KCl 20 mEq/L @ 125 ml/hr.   Labs reviewed: CBG 132, Creatinine 0.59, Platelets 136.   Nutrition-Focused physical exam completed. Findings are moderate-severe fat depletion, moderate-severe muscle depletion, and no edema.   Diet Order:  Diet Heart Room service appropriate? Yes; Fluid consistency: Thin  Skin:  Reviewed, no issues  Last BM:  09/10/2016  Height:   Ht Readings from Last 1 Encounters:  09/10/16 6' (1.829 m)    Weight:   Wt Readings from Last 1 Encounters:  09/11/16 172 lb 14.4 oz (78.4 kg)    Ideal Body Weight:  80.9 kg  BMI:  Body mass index is 23.45 kg/m.  Estimated Nutritional Needs:   Kcal:  2020-2330 (MSJ x 1.3-1.5)  Protein:  94-118 grams (1.2-1.5 grams/kg)  Fluid:  2 L/day (25 ml/kg)  EDUCATION NEEDS:   No education needs identified at this time  Willey Blade, MS,  RD, LDN Pager: [phone] After Hours Pager: [phone]

## 2016-09-11 NOTE — Progress Notes (Signed)
Pharmacy Antibiotic Note  Frank Cowan is a 76 y.o. male admitted on 09/10/2016 with Febrile Neutropenia. Pharmacy has been consulted for  Cefepime dosing.  Plan: Will start patient on Cefepime 2g every 8 hours   Height: 6' (182.9 cm) Weight: 172 lb 14.4 oz (78.4 kg) IBW/kg (Calculated) : 77.6  Temp (24hrs), Avg:97.8 F (36.6 C), Min:97.6 F (36.4 C), Max:98.4 F (36.9 C)   Recent Labs Lab 09/10/16 1014 09/11/16 0616  WBC 2.6* 1.9*  CREATININE 0.73 0.59*    Estimated Creatinine Clearance: 84.9 mL/min (A) (by C-G formula based on SCr of 0.59 mg/dL (L)).    No Known Allergies  Antimicrobials this admission: 5/31 cefepime >>   Dose adjustments this admission:  Microbiology results: 5/31BCx: sent   Thank you for allowing pharmacy to be a part of this patient's care.  Pernell Dupre, PharmD, BCPS Clinical Pharmacist 09/11/2016 10:36 AM

## 2016-09-11 NOTE — Progress Notes (Signed)
Nutrition:  Nutrition follow-up appointment cancelled today due to patient in the hospital.  Will follow-up as able.  Warnell Rasnic B. Zenia Resides, [place], [place] Registered Dietitian [phone] (pager)

## 2016-09-11 NOTE — Care Management (Signed)
Admitted to this facility with the diagnosis of failure to thrive. Lives with wife, [name] [phone]). Son is Swan [phone]). Prescriptions are filled at [facility]. Takes care of all basic and instrumental activities of daily living himself, drives. Rolling walker in the home. No home health. No skilled facilities. No home oxygen. No falls, Decreased appetite x 18  Months. Lost 60 pounds. Last chemotherapy for lung cancer was Wednesday. Family will transport. Palliative Care consult pending. Shelbie Ammons RN MSN CCM Care Management [phone]

## 2016-09-11 NOTE — Progress Notes (Signed)
[facility] [place] NAME: Frank Cowan    MR#:  [id number]  DATE OF BIRTH:  05/31/1940  SUBJECTIVE:  CHIEF COMPLAINT:  No chief complaint on file.  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 He also was noted to have low-grade fever. His family was concerned about worsening confusion. Labs on admission showed pancytopenia. Urinalysis was unremarkable. Blood cultures are pending. Chest x-ray is pending. Patient was initiated on cefepime. He complains of some cough with phlegm production. He feels very emotional and cries Review of Systems  Constitutional: Positive for fever and malaise/fatigue. Negative for chills and weight loss.  HENT: Negative for congestion.   Eyes: Negative for blurred vision and double vision.  Respiratory: Positive for cough and sputum production. Negative for shortness of breath and wheezing.   Cardiovascular: Positive for chest pain. Negative for palpitations, orthopnea, leg swelling and PND.  Gastrointestinal: Negative for abdominal pain, blood in stool, constipation, diarrhea, nausea and vomiting.  Genitourinary: Negative for dysuria, frequency, hematuria and urgency.  Musculoskeletal: Negative for falls.  Neurological: Negative for dizziness, tremors, focal weakness and headaches.  Endo/Heme/Allergies: Does not bruise/bleed easily.  Psychiatric/Behavioral: Positive for depression. The patient is nervous/anxious. The patient does not have insomnia.     VITAL SIGNS: Blood pressure (!) 84/52, pulse 84, temperature 97.4 F (36.3 C), temperature source Oral, resp. rate 18, height 6' (1.829 m), weight 78.4 kg (172 lb 14.4 oz), SpO2 96  %.  PHYSICAL EXAMINATION:   GENERAL:  76 y.o.-year-old patient settingin the bed with no acute distress, emotional, cries when talking to me.  EYES: Pupils equal, round, reactive to light and accommodation. No scleral icterus. Extraocular muscles intact.  HEENT: Head atraumatic, normocephalic. Oropharynx and nasopharynx clear.  NECK:  Supple, no jugular venous distention. No thyroid enlargement, no tenderness.  LUNGS: Normal breath sounds bilaterally, no wheezing,few scattered rales,rhonchi , but no crepitations noted . No use of accessory muscles of respiration.  CARDIOVASCULAR: S1, S2 normal. No murmurs, rubs, or gallops.  ABDOMEN: Soft, nontender, nondistended. Bowel sounds present. No organomegaly or mass.  EXTREMITIES: No pedal edema, cyanosis, or clubbing.  NEUROLOGIC: Cranial nerves II through XII are intact. Muscle strength 5/5 in all extremities. Sensation intact. Gait not checked.  PSYCHIATRIC: The patient is alert and oriented x 3. sad affect, tearful SKIN: No obvious rash, lesion, or ulcer.   ORDERS/RESULTS REVIEWED:   CBC  Recent Labs Lab 09/10/16 1014 09/11/16 0616  WBC 2.6* 1.9*  HGB 10.0* 9.0*  HCT 28.1* 25.9*  PLT 145* 136*  MCV 92.9 93.6  MCH 33.0 32.6  MCHC 35.5 34.8  RDW 18.4* 18.0*  LYMPHSABS 0.2* 0.1*  MONOABS 0.4 0.2  EOSABS 0.0 0.0  BASOSABS 0.1 0.0   ------------------------------------------------------------------------------------------------------------------  Chemistries   Recent Labs Lab 09/10/16 1014 09/11/16 0616  NA 127* 135  K 3.9 3.9  CL 97* 107  CO2 23 24  GLUCOSE 148* 133*  BUN 22* 19  CREATININE 0.73 0.59*  CALCIUM 8.2* 8.0*  AST 27  --   ALT 23  --   ALKPHOS 59  --   BILITOT 1.0  --    ------------------------------------------------------------------------------------------------------------------ estimated creatinine clearance is 84.9 mL/min (A) (by C-G formula based on SCr of 0.59 mg/dL  (  L)). ------------------------------------------------------------------------------------------------------------------ No results for input(s): TSH, T4TOTAL, T3FREE, THYROIDAB in the last 72 hours.  Invalid input(s): FREET3  Cardiac Enzymes No results for input(s): CKMB, TROPONINI, MYOGLOBIN in the last 168 hours.  Invalid input(s): CK ------------------------------------------------------------------------------------------------------------------ Invalid input(s): POCBNP -RADIOLOGY: No results found.  EKG:  Orders placed or performed during the hospital encounter of 09/10/16  . EKG 12-Lead  . EKG 12-Lead    ASSESSMENT AND PLAN:  Active Problems:   Failure to thrive in adult  #1. Neutropenic fever, blood cultures were taken, urinalysis was unremarkable, chest CT is pending, initiated on cefepime, follow results, Adjust antibiotics depending on the results #2. Acute Bronchitis, chest CT is pending, patient is to continue antibiotic therapy, get sputum culture, if possible.  #3. Anxiety, depression, initiate patient on sertraline, Remeron, Xanax, follow closely #4. Atrial fibrillation, likely sick sinus syndrome, as patient's heart rate ranges from 30s to 150s intermittently, nonsustained, get echocardiogram, cardiology consultation, TSH , follow closely #5 hypotension, patient was given IV fluids, blood pressure did not improve, with angiogram of the chest to rule out pulmonary embolism  Management plans discussed with the patient, family and they are in agreement.   DRUG ALLERGIES: No Known Allergies  CODE STATUS:     Code Status Orders        Start     Ordered   09/10/16 1237  Full code  Continuous     09/10/16 1237    Code Status History    Date Active Date Inactive Code Status Order ID Comments User Context   This patient has a current code status but no historical code status.      TOTAL TIME TAKING CARE OF THIS PATIENT: 40 minutes.    Theodoro Grist M.D  on 09/11/2016 at 4:14 PM  Between 7am to 6pm - Pager - [phone]  After 6pm go to [email or web address] - password [email or web address] [facility]  [facility]  Office  [phone]  CC: Primary care physician; Claiborne Billings, MD

## 2016-09-12 ENCOUNTER — Ambulatory Visit: Payer: Medicare Other

## 2016-09-12 DIAGNOSIS — C3490 Malignant neoplasm of unspecified part of unspecified bronchus or lung: Secondary | ICD-10-CM

## 2016-09-12 DIAGNOSIS — Z515 Encounter for palliative care: Secondary | ICD-10-CM

## 2016-09-12 DIAGNOSIS — C3492 Malignant neoplasm of unspecified part of left bronchus or lung: Secondary | ICD-10-CM

## 2016-09-12 DIAGNOSIS — Z7189 Other specified counseling: Secondary | ICD-10-CM

## 2016-09-12 DIAGNOSIS — R509 Fever, unspecified: Secondary | ICD-10-CM

## 2016-09-12 LAB — CBC
HCT: 24.7 % — ABNORMAL LOW (ref 40.0–52.0)
Hemoglobin: 8.4 g/dL — ABNORMAL LOW (ref 13.0–18.0)
MCH: 32.4 pg (ref 26.0–34.0)
MCHC: 34.1 g/dL (ref 32.0–36.0)
MCV: 94.9 fL (ref 80.0–100.0)
PLATELETS: 162 10*3/uL (ref 150–440)
RBC: 2.6 MIL/uL — AB (ref 4.40–5.90)
RDW: 18 % — ABNORMAL HIGH (ref 11.5–14.5)
WBC: 2.7 10*3/uL — AB (ref 3.8–10.6)

## 2016-09-12 LAB — MAGNESIUM: Magnesium: 0.8 mg/dL — CL (ref 1.7–2.4)

## 2016-09-12 LAB — GLUCOSE, CAPILLARY: GLUCOSE-CAPILLARY: 92 mg/dL (ref 65–99)

## 2016-09-12 MED ORDER — MAGNESIUM SULFATE 4 GM/100ML IV SOLN
4.0000 g | Freq: Two times a day (BID) | INTRAVENOUS | Status: AC
  Administered 2016-09-12 (×2): 4 g via INTRAVENOUS
  Filled 2016-09-12 (×2): qty 100

## 2016-09-12 NOTE — Progress Notes (Signed)
[facility] [place] NAME: Frank Cowan    MR#:  [id number]  DATE OF BIRTH:  13-Dec-1940  SUBJECTIVE:  CHIEF COMPLAINT:  No chief complaint on file.  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 He also was noted to have low-grade fever. His family was concerned about worsening confusion. Labs on admission showed pancytopenia. Urinalysis was unremarkable. Blood cultures are pending. Chest x-ray revealed a pneumonia. The patient feels better today, stronger, admits of some cough and minimal phlegm production. Patient wants to have his telemetry box off. Blood cultures are negative so far. V. tach episode earlier today. Magnesium level is low at 0.8  Review of Systems  Constitutional: Positive for malaise/fatigue. Negative for chills and weight loss.  HENT: Negative for congestion.   Eyes: Negative for blurred vision and double vision.  Respiratory: Positive for cough and sputum production. Negative for shortness of breath and wheezing.   Cardiovascular: Negative for palpitations, orthopnea, leg swelling and PND.  Gastrointestinal: Negative for abdominal pain, blood in stool, constipation, diarrhea, nausea and vomiting.  Genitourinary: Negative for dysuria, frequency, hematuria and urgency.  Musculoskeletal: Negative for falls.  Neurological: Negative for dizziness, tremors, focal weakness and headaches.  Endo/Heme/Allergies: Does not bruise/bleed easily.  Psychiatric/Behavioral: Positive for depression. The patient is nervous/anxious. The patient does not have insomnia.     VITAL SIGNS: Blood pressure (!) 90/44, pulse 70, temperature 97.7 F (36.5 C), temperature  source Oral, resp. rate 18, height 6' (1.829 m), weight 85.8 kg (189 lb 3.2 oz), SpO2 91 %.  PHYSICAL EXAMINATION:   GENERAL:  76 y.o.-year-old patient settingin the bed with no acute distress, more comfortable today, however, talks and talks about the telemetry box  EYES: Pupils equal, round, reactive to light and accommodation. No scleral icterus. Extraocular muscles intact.  HEENT: Head atraumatic, normocephalic. Oropharynx and nasopharynx clear.  NECK:  Supple, no jugular venous distention. No thyroid enlargement, no tenderness.  LUNGS: Some diminished breath sounds bilaterally, no wheezing,few scattered rales,rhonchi , but no crepitations noted . No use of accessory muscles of respiration.  CARDIOVASCULAR: S1, S2 , irregularly irregular. No murmurs, rubs, or gallops.  ABDOMEN: Soft, nontender, nondistended. Bowel sounds present. No organomegaly or mass.  EXTREMITIES: No pedal edema, cyanosis, or clubbing.  NEUROLOGIC: Cranial nerves II through XII are intact. Muscle strength 5/5 in all extremities. Sensation intact. Gait not checked.  PSYCHIATRIC: The patient is alert and oriented x 3. sad affect, more comfortable today SKIN: No obvious rash, lesion, or ulcer.   ORDERS/RESULTS REVIEWED:   CBC  Recent Labs Lab 09/10/16 1014 09/11/16 0616 09/12/16 0500  WBC 2.6* 1.9* 2.7*  HGB 10.0* 9.0* 8.4*  HCT 28.1* 25.9* 24.7*  PLT 145* 136* 162  MCV 92.9 93.6 94.9  MCH 33.0 32.6 32.4  MCHC 35.5 34.8 34.1  RDW 18.4* 18.0* 18.0*  LYMPHSABS 0.2* 0.1*  --   MONOABS 0.4 0.2  --   EOSABS 0.0 0.0  --   BASOSABS 0.1 0.0  --    ------------------------------------------------------------------------------------------------------------------  Chemistries   Recent Labs Lab 09/10/16 1014 09/11/16 0616 09/12/16 1004  NA 127* 135  --   K 3.9 3.9  --   CL 97* 107  --   CO2 23 24  --  GLUCOSE 148* 133*  --   BUN 22* 19  --   CREATININE 0.73 0.59*  --   CALCIUM 8.2* 8.0*  --   MG  --    --  0.8*  AST 27  --   --   ALT 23  --   --   ALKPHOS 59  --   --   BILITOT 1.0  --   --    ------------------------------------------------------------------------------------------------------------------ estimated creatinine clearance is 84.9 mL/min (A) (by C-G formula based on SCr of 0.59 mg/dL (L)). ------------------------------------------------------------------------------------------------------------------  Recent Labs  09/11/16 0616  TSH 0.375    Cardiac Enzymes No results for input(s): CKMB, TROPONINI, MYOGLOBIN in the last 168 hours.  Invalid input(s): CK ------------------------------------------------------------------------------------------------------------------ Invalid input(s): POCBNP -RADIOLOGY: Ct Angio Chest Pe W Or Wo Contrast  Result Date: 09/11/2016 CLINICAL DATA:  Fatigue and weakness. Low-grade fever. Hypertension. 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 EXAM: CT ANGIOGRAPHY CHEST WITH CONTRAST TECHNIQUE: Multidetector CT imaging of the chest was performed using the standard protocol during bolus administration of intravenous contrast. Multiplanar CT image reconstructions and MIPs were obtained to evaluate the vascular anatomy. CONTRAST:  75 cc Isovue 370 COMPARISON:  Chest radiographs dated 08/09/2016. PET-CT dated 05/20/2016. Chest CT dated 05/08/2016. FINDINGS: Cardiovascular: Satisfactory opacification of the pulmonary arteries to the segmental level. No evidence of pulmonary embolism. Normal heart size. No pericardial effusion. Atheromatous coronary artery calcifications. Mediastinum/Nodes: The previously demonstrated 1.5 cm right peritracheal node currently measures 6 mm in short axis diameter on image number 34 of series 4. This measured 1.4 cm on 05/20/2016. The previously demonstrated 1.8 cm right hilar node measures 1.3 cm in short axis diameter on image number 42 of series 4. This measured 2.0 cm on 05/20/2016. No new or  enlarging lymph nodes are seen. Lungs/Pleura: Interval small bilateral pleural effusions. Interval dense, patchy interstitial opacity in both upper lobes and in the superior segments of both lower lobes. Also noted is mild right lower lobe atelectasis adjacent to the pleural fluid. Interval partial cavitation of the previously demonstrated left upper lobe mass, currently measuring 4.4 x 4.3 cm on image number 34 of series 6. This measured 6.3 x 6.1 cm on 05/08/2016 and 6.7 x 6.3 cm on 05/20/2016. Interval loculated fluid anterior to the left upper lobe mass, measuring 2.8 x 2.0 cm on image number 34 of series 6. This measures 8 Hounsfield units in density. The more superiorly located 2.0 x 1.9 cm left upper lobe mass seen on 05/08/2016 currently measures 2.2 x 1.9 cm on images 20 and 19 respectively of series 6. This measured 2.5 x 1.8 cm in maximum diameter on 05/20/2016 without hypermetabolic activity. No new lung masses or nodules. Upper Abdomen: A large upper pole left renal cyst is again demonstrated. Musculoskeletal: Thoracic spine degenerative changes. No evidence of bony metastatic disease. Review of the MIP images confirms the above findings. IMPRESSION: 1. No pulmonary emboli. 2. Interval dense bilateral upper lobe and superior segment lower lobe patchy interstitial opacity, compatible with extensive bilateral infectious pneumonitis. 3. Interval small bilateral pleural effusions. 4. Interval decrease in size of the left upper lobe malignancy with interval partial cavitation. 5. Improving metastatic mediastinal and right hilar adenopathy. 6. Stable non hypermetabolic left upper lobe mass more superiorly. 7. Interval oval area of loculated fluid anterior to the left upper lobe malignancy. Electronically Signed   By: Claudie Revering M.D.   On: 09/11/2016 17:22    EKG:  Orders placed or performed  during the hospital encounter of 09/10/16  . EKG 12-Lead  . EKG 12-Lead  . EKG 12-Lead  . EKG 12-Lead     ASSESSMENT AND PLAN:  Active Problems:   Failure to thrive in adult  #1. Neutropenic fever due to bacterial pneumonia , blood culturesare negative so far, urinalysis was unremarkable, chest CT revealed no PE but bilateral pneumonia,. Continue patient on cefepime/Zithromax. Get sputum cultures, follow results, adjust antibiotics depending on the results.  #2.bacterial pneumonia, continue antibiotic therapy,  get sputum culture,adjust antibiotics depending on the results  #3. Anxiety, depression,  , improved clinically, continue patient on sertraline, Remeron, Xanax when necessary #4. Atrial fibrillation, likely sick sinus syndrome, as patient's heart rate ranges from 30s to 150s intermittently, nonsustained,  awaiting for echocardiogram, cardiologist , Dr. Nehemiah Massed recommended to undergo ablation, which patient refused , TSH  was normal at 0.37, heart rate remains stable in 70s to 80s  #5 hypotension, patient was given IV fluids, blood pressure did not improve, chest CT showed no pulmonary embolism, patient's blood pressure seems to be stable, however, remains low, possibly due to atrial fibrillation, will follow clinically, however, patient may benefit from conversion to sinus rhythm #6. V. tach, likely due to hypomagnesemia, unable to use beta blockers due to hypotension, intermittent bradycardia, supplement magnesium intravenously, unable to follow patient on telemetry, as he refused to wear telemetry box #7. Hypomagnesemia, supplement intravenously, check magnesium level tomorrow morning #8. Generalized weakness, initiate physical therapy  Management plans discussed with the patient, family and they are in agreement.   DRUG ALLERGIES: No Known Allergies  CODE STATUS:     Code Status Orders        Start     Ordered   09/10/16 1237  Full code  Continuous     09/10/16 1237    Code Status History    Date Active Date Inactive Code Status Order ID Comments User Context   This  patient has a current code status but no historical code status.      TOTAL TIME TAKING CARE OF THIS PATIENT: 40 minutes.    Theodoro Grist M.D on 09/12/2016 at 3:36 PM  Between 7am to 6pm - Pager - [phone]  After 6pm go to [email or web address] - password [email or web address] [facility]  [facility]  Office  [phone]  CC: Primary care physician; Claiborne Billings, MD

## 2016-09-12 NOTE — Progress Notes (Signed)
Pharmacy Antibiotic Note  Frank Cowan is a 76 y.o. male admitted on 09/10/2016 with Febrile Neutropenia. Patient has remained afebrile and WBC is now 2.6.  Chest CT: 2. Interval dense bilateral upper lobe and superior segment lower lobe patchy interstitial opacity, compatible with extensive bilateral infectious pneumonitis.   Pharmacy has been consulted for  Cefepime dosing.  Plan: Will continue patient on Cefepime 2g every 8 hours   Height: 6' (182.9 cm) Weight: 189 lb 3.2 oz (85.8 kg) IBW/kg (Calculated) : 77.6  Temp (24hrs), Avg:97.7 F (36.5 C), Min:97.4 F (36.3 C), Max:97.9 F (36.6 C)   Recent Labs Lab 09/10/16 1014 09/11/16 0616 09/12/16 0500  WBC 2.6* 1.9* 2.7*  CREATININE 0.73 0.59*  --     Estimated Creatinine Clearance: 84.9 mL/min (A) (by C-G formula based on SCr of 0.59 mg/dL (L)).    No Known Allergies  Antimicrobials this admission: 5/31 cefepime >>   Dose adjustments this admission:  Microbiology results: 5/31BCx: sent   Thank you for allowing pharmacy to be a part of this patient's care.  Pernell Dupre, PharmD, BCPS Clinical Pharmacist 09/12/2016 10:21 AM

## 2016-09-12 NOTE — Consult Note (Signed)
Consultation Note Date: 09/12/2016   Patient Name: Frank Cowan  DOB: 07/22/1940  MRN: [id number]  Age / Sex: 76 y.o., male  PCP: Claiborne Billings, MD Referring Physician: Theodoro Grist, MD  Reason for Consultation: Establishing goals of care  HPI/Patient Profile: 76 y.o. male with past medical history of stage III squamous cell carcinoma of upper left lung and kidney stones admitted on 09/10/2016 with weakness. Followed by Dr. Grayland Ormond for lung cancer. Currently receiving carboplatinum, taxol, and radiation. Sent to ED from cancer center due to declining performance status. Admitted for failure to thrive and dehydration. Receiving IVF. Urinalysis negative. Receiving antibiotics for acute bronchitis. CT chest negative for PE. Patient also with new onset afib and evaluated by cardiology. Per attending, he has declined anticoagulation. Palliative medicine consultation for goals of care.    Clinical Assessment and Goals of Care: I have reviewed medical records, discussed with care team, and met with patient at bedside to discuss diagnosis, GOC, EOL wishes, disposition and options. No family at bedside.   Introduced Palliative Medicine as specialized medical care for people living with serious illness. It focuses on providing relief from the symptoms and stress of a serious illness. The goal is to improve quality of life for both the patient and the family.  We discussed a brief life review of the patient. He lives at home with his wife. She is disabled and he is her primary caretaker. Two adult children who live nearby and supportive. Married for 56 years. Worked in a Clinical cytogeneticist. He was diagnosed with cancer last December and is followed by Dr. Grayland Ormond. Currently receiving chemo and radiation.   [name] is irritable during our conversation. He speaks of losing "a lot of weight" and "cancer should have been found  sooner then it was." He tells me he drives himself to radiation and home and has a good appetite.   Advanced directives, concepts specific to code status, and artifical feeding and hydration were considered and discussed. He does not have a living will or documented HCPOA. Encouraged him to consider a trusting decision maker as HCPOA if he was unable to make decisions for himself. Also encouraged him to consider documenting a living will and DNR/DNI with age and underlying cancer. He states "I will think about it" and "get everything together at some point."  Hard Choices copy given.   At this point, he is hopeful to continue chemotherapy/radiation and start "other treatment soon" that Dr. Grayland Ormond has to offer.   Remained irritable throughout the conversation and kindly dismissed me from the room so the "nurse can get blood."    SUMMARY OF RECOMMENDATIONS    FULL code/FULL scope treatment.   Educated on importance of documenting advanced directives/HCPOA and consider DNR/DNI with age and underlying cancer.   Patient wishes to pursue all medically recommended options for cancer.   May benefit from palliative to follow at home on discharge.   PMT not at [facility] over the weekend but will follow-up with patient/family next week if still  hospitalized.   Code Status/Advance Care Planning:  Full code  Symptom Management:   Per attending  Palliative Prophylaxis:   Aspiration, Delirium Protocol, Frequent Pain Assessment, Oral Care and Turn Reposition  Additional Recommendations (Limitations, Scope, Preferences):  Full Scope Treatment  Psycho-social/Spiritual:   Desire for further Chaplaincy support: no  Additional Recommendations: Caregiving  Support/Resources  Prognosis:   Unable to determine  Discharge Planning: Home with Home Health      Primary Diagnoses: Present on Admission: . Failure to thrive in adult   I have reviewed the medical record, interviewed the patient  and family, and examined the patient. The following aspects are pertinent.  Past Medical History:  Diagnosis Date  . Cancer ([place])    lung  . Cataract   . Collagen vascular disease ([place])   . History of kidney stones   . Kidney stones 2014   Social History   Social History  . Marital status: Married    Spouse name: N/A  . Number of children: N/A  . Years of education: N/A   Social History Main Topics  . Smoking status: Former Smoker    Packs/day: 0.50    Years: 55.00    Quit date: 03/28/2016  . Smokeless tobacco: Former Systems developer  . Alcohol use No     Comment: Not lately  . Drug use: No  . Sexual activity: Not Asked   Other Topics Concern  . None   Social History Narrative  . None   Family History  Problem Relation Age of Onset  . Heart disease Mother   . Heart disease Father   . Cancer Sister 6       luekemia   Scheduled Meds: . dexamethasone  4 mg Oral Daily  . docusate sodium  100 mg Oral BID  . enoxaparin (LOVENOX) injection  40 mg Subcutaneous Q24H  . feeding supplement (ENSURE ENLIVE)  237 mL Oral BID BM  . folic acid  1 mg Oral Daily  . mouth rinse  15 mL Mouth Rinse BID  . megestrol  40 mg Oral Daily  . mirtazapine  7.5 mg Oral QHS  . pantoprazole  40 mg Oral Daily  . sertraline  25 mg Oral QHS  . tamsulosin  0.4 mg Oral Daily   Continuous Infusions: . 0.9 % NaCl with KCl 20 mEq / L 75 mL/hr at 09/12/16 0949  . azithromycin Stopped (09/11/16 2140)  . ceFEPime (MAXIPIME) IV 2 g (09/12/16 0948)   PRN Meds:.acetaminophen **OR** acetaminophen, ALPRAZolam, bisacodyl, ondansetron **OR** ondansetron (ZOFRAN) IV, ondansetron, prochlorperazine Medications Prior to Admission:  Prior to Admission medications   Medication Sig Start Date End Date Taking? Authorizing Provider  acetaminophen (TYLENOL) 500 MG tablet Take 500 mg by mouth every 8 (eight) hours as needed for mild pain or headache. On hold for procedure   Yes [provider]  dexamethasone  (DECADRON) 4 MG tablet Take 1 tablet (4 mg total) by mouth daily. 08/13/16  Yes Lloyd Huger, MD  esomeprazole (NEXIUM) 40 MG capsule Take 1 capsule (40 mg total) by mouth daily. 08/02/16 08/02/17 Yes Earleen Newport, MD  folic acid (FOLVITE) 1 MG tablet Take 1 mg by mouth daily.  09/13/13  Yes [provider]  lidocaine (XYLOCAINE) 2 % solution Use as directed 20 mLs in the mouth or throat as needed for mouth pain. 08/02/16  Yes Earleen Newport, MD  lidocaine-prilocaine (EMLA) cream Apply to affected area once 06/22/16  Yes Lloyd Huger, MD  megestrol (MEGACE) 40 MG tablet Take 1 tablet (40 mg total) by mouth daily. 06/30/16  Yes Burns, Wandra Feinstein, NP  methotrexate (RHEUMATREX) 2.5 MG tablet Take 20 mg by mouth once a week. Wednesday 05/14/16  Yes [provider]  naproxen sodium (ANAPROX) 220 MG tablet Take 220 mg by mouth 2 (two) times daily as needed.    Yes [provider]  ondansetron (ZOFRAN) 8 MG tablet Take 1 tablet (8 mg total) by mouth 2 (two) times daily as needed for refractory nausea / vomiting. 06/22/16  Yes Lloyd Huger, MD  oxyCODONE-acetaminophen (PERCOCET/ROXICET) 5-325 MG tablet Take 1-2 tablets by mouth every 6 (six) hours as needed for severe pain. 09/02/16  Yes Lloyd Huger, MD  prochlorperazine (COMPAZINE) 10 MG tablet Take 1 tablet (10 mg total) by mouth every 6 (six) hours as needed (Nausea or vomiting). 06/22/16  Yes Lloyd Huger, MD  magic mouthwash w/lidocaine SOLN Take 5 mLs by mouth 3 (three) times daily. Patient not taking: Reported on 09/10/2016 08/13/16   Lloyd Huger, MD  sucralfate (CARAFATE) 1 g tablet Take 1 tablet (1 g total) by mouth 3 (three) times daily. Dissolve in 2-3 TBSP warm water, swish and swallow. Patient not taking: Reported on 09/10/2016 07/22/16   Noreene Filbert, MD  tamsulosin (FLOMAX) 0.4 MG CAPS capsule Take 0.4 mg by mouth daily.  05/13/16 05/13/17  [provider]    triamcinolone ointment (KENALOG) 0.5 % Apply 1 application topically 3 (three) times daily. Patient not taking: Reported on 09/10/2016 08/02/16   Earleen Newport, MD   No Known Allergies Review of Systems  Constitutional: Positive for activity change, fatigue and unexpected weight change.  Neurological: Positive for weakness.   Physical Exam  Constitutional: He is oriented to person, place, and time. He is cooperative.  HENT:  Head: Normocephalic and atraumatic.  Cardiovascular: An irregularly irregular rhythm present.  Pulmonary/Chest: Effort normal. He has decreased breath sounds.  Abdominal: Normal appearance.  Neurological: He is alert and oriented to person, place, and time.  Skin: Skin is warm and dry. There is pallor.  Psychiatric: His speech is normal. Cognition and memory are normal.  irritable  Nursing note and vitals reviewed.  Vital Signs: BP (!) 93/47 (BP Location: Left Arm)   Pulse 86   Temp 97.9 F (36.6 C) (Oral)   Resp 18   Ht 6' (1.829 m)   Wt 85.8 kg (189 lb 3.2 oz)   SpO2 93%   BMI 25.66 kg/m  Pain Assessment: 0-10   Pain Score: 0-No pain  SpO2: SpO2: 93 % O2 Device:SpO2: 93 % O2 Flow Rate: .   IO: Intake/output summary:   Intake/Output Summary (Last 24 hours) at 09/12/16 1036 Last data filed at 09/12/16 0954  Gross per 24 hour  Intake          2304.58 ml  Output             1200 ml  Net          1104.58 ml    LBM: Last BM Date: 09/10/16 Baseline Weight: Weight: 76.3 kg (168 lb 3.2 oz) Most recent weight: Weight: 85.8 kg (189 lb 3.2 oz)     Palliative Assessment/Data: PPS 50%   Flowsheet Rows     Most Recent Value  Intake Tab  Referral Department  Oncology  Unit at Time of Referral  Oncology Unit  Palliative Care Primary Diagnosis  Cancer  Date Notified  09/11/16  Palliative Care Type  New Palliative care  Reason for referral  Clarify Goals of Care  Date of Admission  09/10/16  Date first seen by Palliative Care  09/12/16  #  of days Palliative referral response time  1 Day(s)  # of days IP prior to Palliative referral  1  Clinical Assessment  Palliative Performance Scale Score  50%  Psychosocial & Spiritual Assessment  Palliative Care Outcomes  Patient/Family meeting held?  Yes  Who was at the meeting?  patient  Palliative Care Outcomes  Clarified goals of care, ACP counseling assistance, Provided psychosocial or spiritual support      Time In: 0930 Time Out: 1030 Time Total: 53mn Greater than 50%  of this time was spent counseling and coordinating care related to the above assessment and plan.  Signed by:  MIhor Dow FNP-C Palliative Medicine Team  Phone: [phone]: [phone]  Please contact Palliative Medicine Team phone at [phone] questions and concerns.  For individual provider: See AShea Evans

## 2016-09-12 NOTE — Progress Notes (Signed)
Patient had 5 runs of V tach per telemetry. Seen patient, no unusualities noted, denied chest pain or any chest discomforts. MD Marcille Blanco informed about it. Will continue to monitor patient.

## 2016-09-12 NOTE — Consult Note (Signed)
[facility] Consultation Note  Patient ID: Frank Cowan, MRN: [id number], DOB/AGE: June 26, 1940 76 y.o. Admit date: 09/10/2016   Date of Consult: 09/12/2016 Primary Physician: Claiborne Billings, MD Primary Cardiologist: None  Chief Complaint: No chief complaint on file.  Reason for Consult: atrial fibrillation  HPI: 76 y.o. 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 The patient has had what appears to be bronchitis and/or pneumonia for which she is now receiving appropriate inhalers and antibiotics. He has slowly improved from his symptoms. The patient was noted by EKG and by exam to have an irregular heart [place] with an EKG showing atrial fibrillation with controlled ventricular rate. He has not had this diagnosis in the past and has not had any heart failure and/or myocardial infarction at this time. He feels well and this is asymptomatic at this time. Heart rate appears to be controlled without additional medication management.  Past Medical History:  Diagnosis Date  . Cancer ([place])    lung  . Cataract   . Collagen vascular disease ([place])   . History of kidney stones   . Kidney stones 2014      Surgical History:  Past Surgical History:  Procedure Laterality Date  . CYSTOSCOPY W/ URETEROSCOPY W/ LITHOTRIPSY    . ENDOBRONCHIAL ULTRASOUND N/A 06/12/2016   Procedure: ENDOBRONCHIAL ULTRASOUND;  Surgeon: Laverle Hobby, MD;  Location: [facility] OR[facility];  Service: Pulmonary;  Laterality: N/A;  . PORTA CATH INSERTION N/A 06/26/2016   Procedure: Glori Luis Cath Insertion;  Surgeon: Algernon Huxley, MD;  Location: [facility];  Service: Cardiovascular;  Laterality: N/A;  . TONSILLECTOMY       Home Meds: Prior to Admission medications   Medication Sig Start Date End Date Taking? Authorizing Provider  acetaminophen (TYLENOL) 500 MG  tablet Take 500 mg by mouth every 8 (eight) hours as needed for mild pain or headache. On hold for procedure   Yes [provider]  dexamethasone (DECADRON) 4 MG tablet Take 1 tablet (4 mg total) by mouth daily. 08/13/16  Yes Lloyd Huger, MD  esomeprazole (NEXIUM) 40 MG capsule Take 1 capsule (40 mg total) by mouth daily. 08/02/16 08/02/17 Yes Earleen Newport, MD  folic acid (FOLVITE) 1 MG tablet Take 1 mg by mouth daily.  09/13/13  Yes [provider]  lidocaine (XYLOCAINE) 2 % solution Use as directed 20 mLs in the mouth or throat as needed for mouth pain. 08/02/16  Yes Earleen Newport, MD  lidocaine-prilocaine (EMLA) cream Apply to affected area once 06/22/16  Yes Finnegan, Kathlene November, MD  megestrol (MEGACE) 40 MG tablet Take 1 tablet (40 mg total) by mouth daily. 06/30/16  Yes Burns, Wandra Feinstein, NP  methotrexate (RHEUMATREX) 2.5 MG tablet Take 20 mg by mouth once a week. Wednesday 05/14/16  Yes [provider]  naproxen sodium (ANAPROX) 220 MG tablet Take 220 mg by mouth 2 (two) times daily as needed.    Yes [provider]  ondansetron (ZOFRAN) 8 MG tablet Take 1 tablet (8 mg total) by mouth 2 (two) times daily as needed for refractory nausea / vomiting. 06/22/16  Yes Lloyd Huger, MD  oxyCODONE-acetaminophen (PERCOCET/ROXICET) 5-325 MG tablet Take 1-2 tablets by mouth every 6 (six) hours as needed for severe pain. 09/02/16  Yes Lloyd Huger, MD  prochlorperazine (COMPAZINE) 10 MG tablet Take  1 tablet (10 mg total) by mouth every 6 (six) hours as needed (Nausea or vomiting). 06/22/16  Yes Lloyd Huger, MD  magic mouthwash w/lidocaine SOLN Take 5 mLs by mouth 3 (three) times daily. Patient not taking: Reported on 09/10/2016 08/13/16   Lloyd Huger, MD  sucralfate (CARAFATE) 1 g tablet Take 1 tablet (1 g total) by mouth 3 (three) times daily. Dissolve in 2-3 TBSP warm water, swish and swallow. Patient not taking: Reported on 09/10/2016  07/22/16   Noreene Filbert, MD  tamsulosin (FLOMAX) 0.4 MG CAPS capsule Take 0.4 mg by mouth daily.  05/13/16 05/13/17  [provider]  triamcinolone ointment (KENALOG) 0.5 % Apply 1 application topically 3 (three) times daily. Patient not taking: Reported on 09/10/2016 08/02/16   Earleen Newport, MD    Inpatient Medications:  . dexamethasone  4 mg Oral Daily  . docusate sodium  100 mg Oral BID  . enoxaparin (LOVENOX) injection  40 mg Subcutaneous Q24H  . feeding supplement (ENSURE ENLIVE)  237 mL Oral BID BM  . folic acid  1 mg Oral Daily  . mouth rinse  15 mL Mouth Rinse BID  . megestrol  40 mg Oral Daily  . mirtazapine  7.5 mg Oral QHS  . pantoprazole  40 mg Oral Daily  . sertraline  25 mg Oral QHS  . tamsulosin  0.4 mg Oral Daily   . 0.9 % NaCl with KCl 20 mEq / L 125 mL/hr at 09/12/16 0055  . azithromycin Stopped (09/11/16 2140)  . ceFEPime (MAXIPIME) IV Stopped (09/12/16 0126)    Allergies: No Known Allergies  Social History   Social History  . Marital status: Married    Spouse name: N/A  . Number of children: N/A  . Years of education: N/A   Occupational History  . Not on file.   Social History Main Topics  . Smoking status: Former Smoker    Packs/day: 0.50    Years: 55.00    Quit date: 03/28/2016  . Smokeless tobacco: Former Systems developer  . Alcohol use No     Comment: Not lately  . Drug use: No  . Sexual activity: Not on file   Other Topics Concern  . Not on file   Social History Narrative  . No narrative on file     Family History  Problem Relation Age of Onset  . Heart disease Mother   . Heart disease Father   . Cancer Sister 6       luekemia     Review of Systems Positive for Cough congestion Negative for: General:  chills, fever, night sweats or weight changes.  Cardiovascular: PND orthopnea syncope dizziness  Dermatological skin lesions rashes Respiratory: Positive for Cough congestion Urologic: Frequent urination urination at night  and hematuria Abdominal: negative for nausea, vomiting, diarrhea, bright red blood per rectum, melena, or hematemesis Neurologic: negative for visual changes, and/or hearing changes  All other systems reviewed and are otherwise negative except as noted above.  Labs: No results for input(s): CKTOTAL, CKMB, TROPONINI in the last 72 hours. Lab Results  Component Value Date   WBC 2.7 (L) 09/12/2016   HGB 8.4 (L) 09/12/2016   HCT 24.7 (L) 09/12/2016   MCV 94.9 09/12/2016   PLT 162 09/12/2016    Recent Labs Lab 09/10/16 1014 09/11/16 0616  NA 127* 135  K 3.9 3.9  CL 97* 107  CO2 23 24  BUN 22* 19  CREATININE 0.73 0.59*  CALCIUM 8.2* 8.0*  PROT 5.8*  --   BILITOT 1.0  --   ALKPHOS 59  --   ALT 23  --   AST 27  --   GLUCOSE 148* 133*   No results found for: CHOL, HDL, LDLCALC, TRIG No results found for: DDIMER  Radiology/Studies:  Ct Angio Chest Pe W Or Wo Contrast  Result Date: 09/11/2016 CLINICAL DATA:  Fatigue and weakness. Low-grade fever. Hypertension. 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 EXAM: CT ANGIOGRAPHY CHEST WITH CONTRAST TECHNIQUE: Multidetector CT imaging of the chest was performed using the standard protocol during bolus administration of intravenous contrast. Multiplanar CT image reconstructions and MIPs were obtained to evaluate the vascular anatomy. CONTRAST:  75 cc Isovue 370 COMPARISON:  Chest radiographs dated 08/09/2016. PET-CT dated 05/20/2016. Chest CT dated 05/08/2016. FINDINGS: Cardiovascular: Satisfactory opacification of the pulmonary arteries to the segmental level. No evidence of pulmonary embolism. Normal heart size. No pericardial effusion. Atheromatous coronary artery calcifications. Mediastinum/Nodes: The previously demonstrated 1.5 cm right peritracheal node currently measures 6 mm in short axis diameter on image number 34 of series 4. This measured 1.4 cm on 05/20/2016. The previously demonstrated 1.8 cm right hilar node  measures 1.3 cm in short axis diameter on image number 42 of series 4. This measured 2.0 cm on 05/20/2016. No new or enlarging lymph nodes are seen. Lungs/Pleura: Interval small bilateral pleural effusions. Interval dense, patchy interstitial opacity in both upper lobes and in the superior segments of both lower lobes. Also noted is mild right lower lobe atelectasis adjacent to the pleural fluid. Interval partial cavitation of the previously demonstrated left upper lobe mass, currently measuring 4.4 x 4.3 cm on image number 34 of series 6. This measured 6.3 x 6.1 cm on 05/08/2016 and 6.7 x 6.3 cm on 05/20/2016. Interval loculated fluid anterior to the left upper lobe mass, measuring 2.8 x 2.0 cm on image number 34 of series 6. This measures 8 Hounsfield units in density. The more superiorly located 2.0 x 1.9 cm left upper lobe mass seen on 05/08/2016 currently measures 2.2 x 1.9 cm on images 20 and 19 respectively of series 6. This measured 2.5 x 1.8 cm in maximum diameter on 05/20/2016 without hypermetabolic activity. No new lung masses or nodules. Upper Abdomen: A large upper pole left renal cyst is again demonstrated. Musculoskeletal: Thoracic spine degenerative changes. No evidence of bony metastatic disease. Review of the MIP images confirms the above findings. IMPRESSION: 1. No pulmonary emboli. 2. Interval dense bilateral upper lobe and superior segment lower lobe patchy interstitial opacity, compatible with extensive bilateral infectious pneumonitis. 3. Interval small bilateral pleural effusions. 4. Interval decrease in size of the left upper lobe malignancy with interval partial cavitation. 5. Improving metastatic mediastinal and right hilar adenopathy. 6. Stable non hypermetabolic left upper lobe mass more superiorly. 7. Interval oval area of loculated fluid anterior to the left upper lobe malignancy. Electronically Signed   By: Claudie Revering M.D.   On: 09/11/2016 17:22    EKG: Atrial fibrillation with  controlled ventricular rate and nonspecific ST and T-wave changes  Weights: Filed Weights   09/10/16 1233 09/11/16 0500 09/12/16 0608  Weight: 76.3 kg (168 lb 3.2 oz) 78.4 kg (172 lb 14.4 oz) 85.8 kg (189 lb 3.2 oz)     Physical Exam: Blood pressure (!) 93/47, pulse 86, temperature 97.9 F (36.6 C), temperature source Oral, resp. rate 18, height 6' (1.829 m), weight 85.8 kg (189 lb 3.2 oz), SpO2 93 %. Body  mass index is 25.66 kg/m. General: Well developed, well nourished, in no acute distress. Head eyes ears nose throat: Normocephalic, atraumatic, sclera non-icteric, no xanthomas, nares are without discharge. No apparent thyromegaly and/or mass  Lungs: Normal respiratory effort.  Some wheezes, no rales, diffuse rhonchi.  Heart: Irregular with normal S1 S2. no murmur gallop, no rub, PMI is normal size and placement, carotid upstroke normal without bruit, jugular venous pressure is normal Abdomen: Soft, non-tender, non-distended with normoactive bowel sounds. No hepatomegaly. No rebound/guarding. No obvious abdominal masses. Abdominal aorta is normal size without bruit Extremities: No edema. no cyanosis, no clubbing, no ulcers  Peripheral : 2+ bilateral upper extremity pulses, 2+ bilateral femoral pulses, 2+ bilateral dorsal pedal pulse Neuro: Alert and oriented. No facial asymmetry. No focal deficit. Moves all extremities spontaneously. Musculoskeletal: Normal muscle tone without kyphosis Psych:  Responds to questions appropriately with a normal affect.    Assessment: 76 year old male with squamous cell cancer with acute bronchitis pneumonia and infection with hypoxia but no current evidence of myocardial infarction or congestive heart failure having new onset of atrial fibrillation with controlled ventricular rate  Plan: 1. Continue to follow closely for maintenance of normal rhythm or heart rate control by telemetry 2. No additional beta blocker for heart rate control which appears not  to be necessary at this time 3. Anticoagulation for further risk reduction in stroke with atrial fibrillation if able depending on blood count and side effects and bleeding concerns 4. Echocardiogram for LV systolic dysfunction valvular heart disease contributing to above 5. Begin ambulation and follow for need for adjustments of medication management  Signed, Corey Skains M.D. [facility] 09/12/2016, 8:26 AM

## 2016-09-13 ENCOUNTER — Inpatient Hospital Stay
Admission: AD | Admit: 2016-09-13 | Discharge: 2016-09-13 | Disposition: A | Discharge status: Home / Self Care | Payer: Medicare Other | Source: Ambulatory Visit | Attending: Internal Medicine | Admitting: Internal Medicine

## 2016-09-13 LAB — CBC
HCT: 24.7 % — ABNORMAL LOW (ref 40.0–52.0)
Hemoglobin: 8.6 g/dL — ABNORMAL LOW (ref 13.0–18.0)
MCH: 32.3 pg (ref 26.0–34.0)
MCHC: 34.6 g/dL (ref 32.0–36.0)
MCV: 93.5 fL (ref 80.0–100.0)
PLATELETS: 179 10*3/uL (ref 150–440)
RBC: 2.64 MIL/uL — AB (ref 4.40–5.90)
RDW: 18.5 % — ABNORMAL HIGH (ref 11.5–14.5)
WBC: 2.4 10*3/uL — ABNORMAL LOW (ref 3.8–10.6)

## 2016-09-13 LAB — MAGNESIUM: Magnesium: 2.5 mg/dL — ABNORMAL HIGH (ref 1.7–2.4)

## 2016-09-13 MED ORDER — LEVOFLOXACIN 500 MG PO TABS: 500.0000 mg | ORAL_TABLET | Freq: Every day | ORAL | 0 refills | Status: DC

## 2016-09-13 MED ORDER — HEPARIN SOD (PORK) LOCK FLUSH 100 UNIT/ML IV SOLN
500.0000 [IU] | Freq: Once | INTRAVENOUS | Status: DC
  Filled 2016-09-13: qty 5

## 2016-09-13 NOTE — Evaluation (Signed)
Physical Therapy Evaluation Patient Details Name: Frank Cowan MRN: [id number] DOB: Sep 08, 1940 Today's Date: 09/13/2016   History of Present Illness  Frank Cowan  is a 76 y.o. 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 Per family over the past week his performance status has significantly declined. He has minimal PO intake and complains of worsening constipation. He continues to have significant bilateral leg pain that makes it difficult to walk. He has worsening weakness and fatigue. He continues to be highly anxious. He also has a low-grade fever. His daughter reports worsening confusion. He was recently called in Cipro from cancer center for possible UTI, doubt he has taken it yet. He is being directly admitted from cancer center for failure to thrive, dehydration and for further evaluation. Currently admitted for neutropenic fever and bacterial PNA  Clinical Impression  Pt admitted with above diagnosis. Pt currently with functional limitations due to the deficits listed below (see PT Problem List).  Pt demonstrates fair strength and is able to perform sit to stand transfers without UE support but some leaning on bed with backs of legs. He is able to ambulate a full lap around RN station. Pt reports mild DOE however VSS with SaO2>95% on room air throughout. Initially starts with rolling walker but is able to progress to no assistive device. Horizontal and vertical head turns produce mild to moderate lateral deviation and slowing. Single leg balance is impaired and Rhomberg is positive. Pt encouraged to utilize spc vs RW as needed for added stability. Family reports generalized weakness over the last couple weeks as well as decrease in overall activity since starting cancer treatments at the beginning of the year. He would benefit  from OP PT to work on his balance and strength. Family encouraged to discuss with patient's oncologist or PCP. Pt will benefit from skilled PT services to address deficits in strength, balance, and mobility in order to return to full function at home.     Follow Up Recommendations Outpatient PT;Other (comment) (Strength and balance)    Equipment Recommendations  None recommended by PT;Other (comment) (Use spc vs RW as needed for added stability)    Recommendations for Other Services       Precautions / Restrictions Precautions Precautions: Fall Restrictions Weight Bearing Restrictions: No      Mobility  Bed Mobility Overal bed mobility: Modified Independent             General bed mobility comments: HOB minimally elevated, no use of bed rails  Transfers Overall transfer level: Needs assistance Equipment used: None Transfers: Sit to/from Stand Sit to Stand: Supervision         General transfer comment: Pt able to perform without UE support. Fair stability but does support back of knees on bed  Ambulation/Gait Ambulation/Gait assistance: Min guard Ambulation Distance (Feet): 200 Feet Assistive device: Rolling walker (2 wheeled) Gait Pattern/deviations: Decreased step length - right;Decreased step length - left Gait velocity: Decreased but functional for limited community mobiilty Gait velocity interpretation: <1.8 ft/sec, indicative of risk for recurrent falls General Gait Details: Pt able to ambulate a full lap around RN station. Pt reports mild DOE however VSS with SaO2>95% on room air throughout. Initially starts with rolling walker but is able to progress to no assistive device. Horizontal and vertical head turns produce mild  to moderate lateral deviation and slowing  Stairs            Wheelchair Mobility    Modified Rankin (Stroke Patients Only)       Balance Overall balance assessment: Needs assistance Sitting-balance support: No upper extremity  supported Sitting balance-Leahy Scale: Good     Standing balance support: No upper extremity supported Standing balance-Leahy Scale: Fair Standing balance comment: Maintains wide and narrow stance balance. Single leg stance approximately 2 seconds. 5TSTS: 11.7s                             Pertinent Vitals/Pain Pain Assessment: No/denies pain    Home Living Family/patient expects to be discharged to:: Private residence Living Arrangements: Spouse/significant other Available Help at Discharge: Family Type of Home: House Home Access: Ramped entrance   Entrance Stairs-Number of Steps: one Home Layout: One level Home Equipment: Environmental consultant - 2 wheels;Wheelchair - manual;Bedside commode (no home O2, no hospital bed, no lift chair)      Prior Function Level of Independence: Independent         Comments: Independent ADLs/IADLs, drives, ambulates without assistive device. Used his rolling walker just prior to admission due to progressive weakness     Hand Dominance   Dominant Hand: Right    Extremity/Trunk Assessment   Upper Extremity Assessment Upper Extremity Assessment: LUE deficits/detail LUE Deficits / Details: Chronic L shoulder weakness over the last year. Otherwise bilateral UE strength grossly WFL    Lower Extremity Assessment Lower Extremity Assessment: Overall WFL for tasks assessed       Communication   Communication: No difficulties  Cognition Arousal/Alertness: Awake/alert Behavior During Therapy: WFL for tasks assessed/performed Overall Cognitive Status: Within Functional Limits for tasks assessed                                        General Comments      Exercises     Assessment/Plan    PT Assessment Patient needs continued PT services  PT Problem List Decreased strength;Decreased activity tolerance;Decreased balance       PT Treatment Interventions DME instruction;Gait training;Therapeutic activities;Functional  mobility training;Therapeutic exercise;Balance training;Neuromuscular re-education    PT Goals (Current goals can be found in the Care Plan section)  Acute Rehab PT Goals Patient Stated Goal: Return to prior level of function PT Goal Formulation: With patient/family Time For Goal Achievement: 09/27/16 Potential to Achieve Goals: Good    Frequency Min 2X/week   Barriers to discharge        Co-evaluation               AM-PAC PT "6 Clicks" Daily Activity  Outcome Measure Difficulty turning over in bed (including adjusting bedclothes, sheets and blankets)?: None Difficulty moving from lying on back to sitting on the side of the bed? : None Difficulty sitting down on and standing up from a chair with arms (e.g., wheelchair, bedside commode, etc,.)?: A Little Help needed moving to and from a bed to chair (including a wheelchair)?: None Help needed walking in hospital room?: A Little Help needed climbing 3-5 steps with a railing? : A Little 6 Click Score: 21    End of Session Equipment Utilized During Treatment: Gait belt Activity Tolerance: Patient tolerated treatment well Patient left: in bed;with call bell/phone within reach;with bed alarm set;with family/visitor present   PT  Visit Diagnosis: Unsteadiness on feet (R26.81);Muscle weakness (generalized) (M62.81)    Time: 5830-9407 PT Time Calculation (min) (ACUTE ONLY): 20 min   Charges:   PT Evaluation $PT Eval Low Complexity: 1 Procedure     PT G Codes:   PT G-Codes **NOT FOR INPATIENT CLASS** Functional Assessment Tool Used: AM-PAC 6 Clicks Basic Mobility Functional Limitation: Mobility: Walking and moving around Mobility: Walking and Moving Around Current Status (W8088): At least 20 percent but less than 40 percent impaired, limited or restricted Mobility: Walking and Moving Around Goal Status [phone]): At least 1 percent but less than 20 percent impaired, limited or restricted    Phillips Grout PT, DPT     Huprich,Jason 09/13/2016, 10:22 AM

## 2016-09-13 NOTE — Progress Notes (Signed)
*  PRELIMINARY RESULTS* Echocardiogram 2D Echocardiogram has been performed.  Lavell Luster Talar Fraley 09/13/2016, 9:29 AM

## 2016-09-13 NOTE — Progress Notes (Signed)
[facility] Cardiology [facility] Encounter Note  Patient: Frank Cowan / Admit Date: 09/10/2016 / Date of Encounter: 09/13/2016, 6:37 AM   Subjective: Overall patient feeling relatively well today. No evidence of significant tachycardia or bradycardia at this time. Pulmonary status improving with the pulmonary toilet and the cough and congestion. No evidence of heart failure and/or myocardial infarction  Review of Systems: Positive for: Shortness of breath cough congestion Negative for: Vision change, hearing change, syncope, dizziness, nausea, vomiting,diarrhea, bloody stool, stomach pain, positive for cough, congestion, negative for diaphoresis, urinary frequency, urinary pain,skin lesions, skin rashes Others previously listed  Objective: Telemetry: Atrial fibrillation with controlled ventricular rate Physical Exam: Blood pressure (!) 98/50, pulse 78, temperature 97.4 F (36.3 C), resp. rate 20, height 6' (1.829 m), weight 85.8 kg (189 lb 3.2 oz), SpO2 96 %. Body mass index is 25.66 kg/m. General: Well developed, well nourished, in no acute distress. Head: Normocephalic, atraumatic, sclera non-icteric, no xanthomas, nares are without discharge. Neck: No apparent masses Lungs: Normal respirations with no wheezes, diffuse rhonchi, no rales , some crackles   Heart: Irregular rate and rhythm, normal S1 S2, no murmur, no rub, no gallop, PMI is normal size and placement, carotid upstroke normal without bruit, jugular venous pressure normal Abdomen: Soft, non-tender, non-distended with normoactive bowel sounds. No hepatosplenomegaly. Abdominal aorta is normal size without bruit Extremities: No edema, no clubbing, no cyanosis, no ulcers,  Peripheral: 2+ radial, 2+ femoral, 2+ dorsal pedal pulses Neuro: Alert and oriented. Moves all extremities spontaneously. Psych:  Responds to questions appropriately with a normal affect.   Intake/Output Summary (Last 24 hours) at 09/13/16 0637 Last data  filed at 09/13/16 0526  Gross per 24 hour  Intake              240 ml  Output             2000 ml  Net            -1760 ml    Inpatient Medications:  . dexamethasone  4 mg Oral Daily  . docusate sodium  100 mg Oral BID  . enoxaparin (LOVENOX) injection  40 mg Subcutaneous Q24H  . feeding supplement (ENSURE ENLIVE)  237 mL Oral BID BM  . folic acid  1 mg Oral Daily  . mouth rinse  15 mL Mouth Rinse BID  . megestrol  40 mg Oral Daily  . mirtazapine  7.5 mg Oral QHS  . pantoprazole  40 mg Oral Daily  . sertraline  25 mg Oral QHS  . tamsulosin  0.4 mg Oral Daily   Infusions:  . 0.9 % NaCl with KCl 20 mEq / L 75 mL/hr at 09/13/16 0600  . azithromycin Stopped (09/12/16 1936)  . ceFEPime (MAXIPIME) IV Stopped (09/13/16 0243)    Labs:  Recent Labs  09/10/16 1014 09/11/16 0616 09/12/16 1004  NA 127* 135  --   K 3.9 3.9  --   CL 97* 107  --   CO2 23 24  --   GLUCOSE 148* 133*  --   BUN 22* 19  --   CREATININE 0.73 0.59*  --   CALCIUM 8.2* 8.0*  --   MG  --   --  0.8*    Recent Labs  09/10/16 1014  AST 27  ALT 23  ALKPHOS 59  BILITOT 1.0  PROT 5.8*  ALBUMIN 2.7*    Recent Labs  09/10/16 1014 09/11/16 0616 09/12/16 0500 09/13/16 0615  WBC 2.6* 1.9* 2.7*  2.4*  NEUTROABS 2.0 1.7  --   --   HGB 10.0* 9.0* 8.4* 8.6*  HCT 28.1* 25.9* 24.7* 24.7*  MCV 92.9 93.6 94.9 93.5  PLT 145* 136* 162 179   No results for input(s): CKTOTAL, CKMB, TROPONINI in the last 72 hours. Invalid input(s): POCBNP No results for input(s): HGBA1C in the last 72 hours.   Weights: Filed Weights   09/10/16 1233 09/11/16 0500 09/12/16 0608  Weight: 76.3 kg (168 lb 3.2 oz) 78.4 kg (172 lb 14.4 oz) 85.8 kg (189 lb 3.2 oz)     Radiology/Studies:  Ct Angio Chest Pe W Or Wo Contrast  Result Date: 09/11/2016 CLINICAL DATA:  Fatigue and weakness. Low-grade fever. Hypertension. 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 EXAM: CT ANGIOGRAPHY CHEST WITH CONTRAST  TECHNIQUE: Multidetector CT imaging of the chest was performed using the standard protocol during bolus administration of intravenous contrast. Multiplanar CT image reconstructions and MIPs were obtained to evaluate the vascular anatomy. CONTRAST:  75 cc Isovue 370 COMPARISON:  Chest radiographs dated 08/09/2016. PET-CT dated 05/20/2016. Chest CT dated 05/08/2016. FINDINGS: Cardiovascular: Satisfactory opacification of the pulmonary arteries to the segmental level. No evidence of pulmonary embolism. Normal heart size. No pericardial effusion. Atheromatous coronary artery calcifications. Mediastinum/Nodes: The previously demonstrated 1.5 cm right peritracheal node currently measures 6 mm in short axis diameter on image number 34 of series 4. This measured 1.4 cm on 05/20/2016. The previously demonstrated 1.8 cm right hilar node measures 1.3 cm in short axis diameter on image number 42 of series 4. This measured 2.0 cm on 05/20/2016. No new or enlarging lymph nodes are seen. Lungs/Pleura: Interval small bilateral pleural effusions. Interval dense, patchy interstitial opacity in both upper lobes and in the superior segments of both lower lobes. Also noted is mild right lower lobe atelectasis adjacent to the pleural fluid. Interval partial cavitation of the previously demonstrated left upper lobe mass, currently measuring 4.4 x 4.3 cm on image number 34 of series 6. This measured 6.3 x 6.1 cm on 05/08/2016 and 6.7 x 6.3 cm on 05/20/2016. Interval loculated fluid anterior to the left upper lobe mass, measuring 2.8 x 2.0 cm on image number 34 of series 6. This measures 8 Hounsfield units in density. The more superiorly located 2.0 x 1.9 cm left upper lobe mass seen on 05/08/2016 currently measures 2.2 x 1.9 cm on images 20 and 19 respectively of series 6. This measured 2.5 x 1.8 cm in maximum diameter on 05/20/2016 without hypermetabolic activity. No new lung masses or nodules. Upper Abdomen: A large upper pole left renal  cyst is again demonstrated. Musculoskeletal: Thoracic spine degenerative changes. No evidence of bony metastatic disease. Review of the MIP images confirms the above findings. IMPRESSION: 1. No pulmonary emboli. 2. Interval dense bilateral upper lobe and superior segment lower lobe patchy interstitial opacity, compatible with extensive bilateral infectious pneumonitis. 3. Interval small bilateral pleural effusions. 4. Interval decrease in size of the left upper lobe malignancy with interval partial cavitation. 5. Improving metastatic mediastinal and right hilar adenopathy. 6. Stable non hypermetabolic left upper lobe mass more superiorly. 7. Interval oval area of loculated fluid anterior to the left upper lobe malignancy. Electronically Signed   By: Claudie Revering M.D.   On: 09/11/2016 17:22     Assessment and Recommendation  76 y.o. male with the squamous cell lung cancer with multiple treatment having new onset of atrial fibrillation with controlled ventricular rate without evidence of myocardial infarction or  congestive heart failure 1. No additional medication management for heart rate control of atrial fibrillation for which the patient does not have any significant tachycardia sick sinus syndrome or bradycardia at this time 2. Anticoagulation for further risk reduction in stroke with atrial fibrillation if not contraindicated due to current therapy for cancer or concerns of anemia 3. No further cardiac diagnostics necessary at this time 4. Ambulate and follow for improvements of symptoms and need for adjustments of medication management depending on heart rate control 5. Okay for discharge home from with follow-up next week for adjustments of medication  Signed, Serafina Royals M.D. FACC

## 2016-09-13 NOTE — Discharge Summary (Signed)
[place] [facility] [place] NAME: Frank Cowan    MR#:  [id number]  DATE OF BIRTH:  18-Feb-1941  DATE OF ADMISSION:  09/10/2016 ADMITTING PHYSICIAN: Corey Skains, MD  DATE OF DISCHARGE: 09/13/2016 12:11 PM  PRIMARY CARE PHYSICIAN: Claiborne Billings, MD    ADMISSION DIAGNOSIS:  Dehydration failure to thrieve  DISCHARGE DIAGNOSIS:  Active Problems:   Failure to thrive in adult   Fever   Squamous cell lung cancer, left [facility])   Palliative care by specialist   SECONDARY DIAGNOSIS:   Past Medical History:  Diagnosis Date  . Cancer ([place])    lung  . Cataract   . Collagen vascular disease ([place])   . History of kidney stones   . Kidney stones 2014    HOSPITAL COURSE:   76 year old male with past medical history of lung cancer, nephrolithiasis, cataracts who presented to the hospital due to shortness of breath, cough and noted to have pneumonia.  1. Pneumonia-discussed the cause of patient's shortness of breath and cough. Patient has CT scan of the chest which was suggestive of pneumonia. It also had a lot of scarring from his lung cancer and also pneumonitis. Patient was treated with IV cefepime and Zithromax and has clinically improved. His cultures have been negative. -He is now being discharged on oral Levaquin.  2. History of lung cancer-patient will continue follow-up with oncology as an outpatient.  3. Atrial fibrillation-patient had some labile heart rates due to his respiratory distress. Cardiology was consulted who did not recommend any further adjustment of his medications. He is currently rate controlled and asymptomatic and therefore being discharged home.  4. GERD - pt. Will cont. Nexium  5. BPH - cont. Flomax.   DISCHARGE CONDITIONS:   Stable.   CONSULTS OBTAINED:  Treatment Team:  Lloyd Huger, MD Corey Skains, MD  DRUG ALLERGIES:  No Known Allergies  DISCHARGE MEDICATIONS:   Allergies as of 09/13/2016   No  Known Allergies     Medication List    STOP taking these medications   magic mouthwash w/lidocaine Soln   sucralfate 1 g tablet Commonly known as:  CARAFATE   triamcinolone ointment 0.5 % Commonly known as:  KENALOG     TAKE these medications   acetaminophen 500 MG tablet Commonly known as:  TYLENOL Take 500 mg by mouth every 8 (eight) hours as needed for mild pain or headache. On hold for procedure   dexamethasone 4 MG tablet Commonly known as:  DECADRON Take 1 tablet (4 mg total) by mouth daily.   esomeprazole 40 MG capsule Commonly known as:  NEXIUM Take 1 capsule (40 mg total) by mouth daily.   folic acid 1 MG tablet Commonly known as:  FOLVITE Take 1 mg by mouth daily.   levofloxacin 500 MG tablet Commonly known as:  LEVAQUIN Take 1 tablet (500 mg total) by mouth daily.   lidocaine 2 % solution Commonly known as:  XYLOCAINE Use as directed 20 mLs in the mouth or throat as needed for mouth pain.   lidocaine-prilocaine cream Commonly known as:  EMLA Apply to affected area once   megestrol 40 MG tablet Commonly known as:  MEGACE Take 1 tablet (40 mg total) by mouth daily.   methotrexate 2.5 MG tablet Commonly known as:  RHEUMATREX Take 20 mg by mouth once a week. Wednesday   naproxen sodium 220 MG tablet Commonly known as:  ANAPROX Take 220 mg by mouth 2 (two)  times daily as needed.   ondansetron 8 MG tablet Commonly known as:  ZOFRAN Take 1 tablet (8 mg total) by mouth 2 (two) times daily as needed for refractory nausea / vomiting.   oxyCODONE-acetaminophen 5-325 MG tablet Commonly known as:  PERCOCET/ROXICET Take 1-2 tablets by mouth every 6 (six) hours as needed for severe pain.   prochlorperazine 10 MG tablet Commonly known as:  COMPAZINE Take 1 tablet (10 mg total) by mouth every 6 (six) hours as needed (Nausea or vomiting).   tamsulosin 0.4 MG Caps capsule Commonly known as:  FLOMAX Take 0.4 mg by mouth daily.         DISCHARGE  INSTRUCTIONS:   DIET:  Regular diet  DISCHARGE CONDITION:  Stable  ACTIVITY:  Activity as tolerated  OXYGEN:  Home Oxygen: No.   Oxygen Delivery: room air  DISCHARGE LOCATION:  home   If you experience worsening of your admission symptoms, develop shortness of breath, life threatening emergency, suicidal or homicidal thoughts you must seek medical attention immediately by calling 911 or calling your MD immediately  if symptoms less severe.  You Must read complete instructions/literature along with all the possible adverse reactions/side effects for all the Medicines you take and that have been prescribed to you. Take any new Medicines after you have completely understood and accpet all the possible adverse reactions/side effects.   Please note  You were cared for by a hospitalist during your hospital stay. If you have any questions about your discharge medications or the care you received while you were in the hospital after you are discharged, you can call the unit and asked to speak with the hospitalist on call if the hospitalist that took care of you is not available. Once you are discharged, your primary care physician will handle any further medical issues. Please note that NO REFILLS for any discharge medications will be authorized once you are discharged, as it is imperative that you return to your primary care physician (or establish a relationship with a primary care physician if you do not have one) for your aftercare needs so that they can reassess your need for medications and monitor your lab values.     Today   Shortness of breath much improved.  No other complaints presently. Daughter at bedside.  WAnts to go home.   VITAL SIGNS:  Blood pressure (!) 88/54, pulse 66, temperature 97.4 F (36.3 C), resp. rate 20, height 6' (1.829 m), weight 85.6 kg (188 lb 12.8 oz), SpO2 96 %.  I/O:    Intake/Output Summary (Last 24 hours) at 09/13/16 1451 Last data filed at  09/13/16 0800  Gross per 24 hour  Intake              240 ml  Output             1650 ml  Net            -1410 ml    PHYSICAL EXAMINATION:  GENERAL:  76 y.o.-year-old patient lying in the bed with no acute distress.  EYES: Pupils equal, round, reactive to light and accommodation. No scleral icterus. Extraocular muscles intact.  HEENT: Head atraumatic, normocephalic. Oropharynx and nasopharynx clear.  NECK:  Supple, no jugular venous distention. No thyroid enlargement, no tenderness.  LUNGS: Normal breath sounds bilaterally, no wheezing, rales,rhonchi. No use of accessory muscles of respiration.  CARDIOVASCULAR: S1, S2 normal. No murmurs, rubs, or gallops.  ABDOMEN: Soft, non-tender, non-distended. Bowel sounds present. No organomegaly or mass.  EXTREMITIES: No pedal edema, cyanosis, or clubbing.  NEUROLOGIC: Cranial nerves II through XII are intact. No focal motor or sensory defecits b/l. Globally weak.  PSYCHIATRIC: The patient is alert and oriented x 3.   SKIN: No obvious rash, lesion, or ulcer.   DATA REVIEW:   CBC  Recent Labs Lab 09/13/16 0615  WBC 2.4*  HGB 8.6*  HCT 24.7*  PLT 179    Chemistries   Recent Labs Lab 09/10/16 1014 09/11/16 0616  09/13/16 0615  NA 127* 135  --   --   K 3.9 3.9  --   --   CL 97* 107  --   --   CO2 23 24  --   --   GLUCOSE 148* 133*  --   --   BUN 22* 19  --   --   CREATININE 0.73 0.59*  --   --   CALCIUM 8.2* 8.0*  --   --   MG  --   --   < > 2.5*  AST 27  --   --   --   ALT 23  --   --   --   ALKPHOS 59  --   --   --   BILITOT 1.0  --   --   --   < > = values in this interval not displayed.  Cardiac Enzymes No results for input(s): TROPONINI in the last 168 hours.  Microbiology Results  Results for orders placed or performed during the hospital encounter of 09/10/16  CULTURE, BLOOD (ROUTINE X 2) w Reflex to ID Panel     Status: None (Preliminary result)   Collection Time: 09/11/16  8:05 AM  Result Value Ref Range Status    Specimen Description BLOOD R AC  Final   Special Requests   Final    BOTTLES DRAWN AEROBIC AND ANAEROBIC Blood Culture adequate volume   Culture NO GROWTH 2 DAYS  Final   Report Status PENDING  Incomplete  CULTURE, BLOOD (ROUTINE X 2) w Reflex to ID Panel     Status: None (Preliminary result)   Collection Time: 09/11/16  8:15 AM  Result Value Ref Range Status   Specimen Description BLOOD L AC  Final   Special Requests   Final    BOTTLES DRAWN AEROBIC AND ANAEROBIC Blood Culture adequate volume   Culture NO GROWTH 2 DAYS  Final   Report Status PENDING  Incomplete    RADIOLOGY:  Ct Angio Chest Pe W Or Wo Contrast  Result Date: 09/11/2016 CLINICAL DATA:  Fatigue and weakness. Low-grade fever. Hypertension. 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 EXAM: CT ANGIOGRAPHY CHEST WITH CONTRAST TECHNIQUE: Multidetector CT imaging of the chest was performed using the standard protocol during bolus administration of intravenous contrast. Multiplanar CT image reconstructions and MIPs were obtained to evaluate the vascular anatomy. CONTRAST:  75 cc Isovue 370 COMPARISON:  Chest radiographs dated 08/09/2016. PET-CT dated 05/20/2016. Chest CT dated 05/08/2016. FINDINGS: Cardiovascular: Satisfactory opacification of the pulmonary arteries to the segmental level. No evidence of pulmonary embolism. Normal heart size. No pericardial effusion. Atheromatous coronary artery calcifications. Mediastinum/Nodes: The previously demonstrated 1.5 cm right peritracheal node currently measures 6 mm in short axis diameter on image number 34 of series 4. This measured 1.4 cm on 05/20/2016. The previously demonstrated 1.8 cm right hilar node measures 1.3 cm in short axis diameter on image number 42 of series 4. This measured 2.0 cm on 05/20/2016. No new  or enlarging lymph nodes are seen. Lungs/Pleura: Interval small bilateral pleural effusions. Interval dense, patchy interstitial opacity in both upper lobes  and in the superior segments of both lower lobes. Also noted is mild right lower lobe atelectasis adjacent to the pleural fluid. Interval partial cavitation of the previously demonstrated left upper lobe mass, currently measuring 4.4 x 4.3 cm on image number 34 of series 6. This measured 6.3 x 6.1 cm on 05/08/2016 and 6.7 x 6.3 cm on 05/20/2016. Interval loculated fluid anterior to the left upper lobe mass, measuring 2.8 x 2.0 cm on image number 34 of series 6. This measures 8 Hounsfield units in density. The more superiorly located 2.0 x 1.9 cm left upper lobe mass seen on 05/08/2016 currently measures 2.2 x 1.9 cm on images 20 and 19 respectively of series 6. This measured 2.5 x 1.8 cm in maximum diameter on 05/20/2016 without hypermetabolic activity. No new lung masses or nodules. Upper Abdomen: A large upper pole left renal cyst is again demonstrated. Musculoskeletal: Thoracic spine degenerative changes. No evidence of bony metastatic disease. Review of the MIP images confirms the above findings. IMPRESSION: 1. No pulmonary emboli. 2. Interval dense bilateral upper lobe and superior segment lower lobe patchy interstitial opacity, compatible with extensive bilateral infectious pneumonitis. 3. Interval small bilateral pleural effusions. 4. Interval decrease in size of the left upper lobe malignancy with interval partial cavitation. 5. Improving metastatic mediastinal and right hilar adenopathy. 6. Stable non hypermetabolic left upper lobe mass more superiorly. 7. Interval oval area of loculated fluid anterior to the left upper lobe malignancy. Electronically Signed   By: Claudie Revering M.D.   On: 09/11/2016 17:22      Management plans discussed with the patient, family and they are in agreement.  CODE STATUS:     Code Status Orders        Start     Ordered   09/10/16 1237  Full code  Continuous     09/10/16 1237    Code Status History    Date Active Date Inactive Code Status Order ID Comments User  Context   This patient has a current code status but no historical code status.      TOTAL TIME TAKING CARE OF THIS PATIENT: 40 minutes.    Henreitta Leber M.D on 09/13/2016 at 2:51 PM  Between 7am to 6pm - Pager - [phone]  After 6pm go to [email or web address] - Proofreader  [facility] [place]  Office  [phone]  CC: Primary care physician; Claiborne Billings, MD

## 2016-09-13 NOTE — Care Management Note (Addendum)
Case Management Note  Frank Cowan Details  Name: Frank Cowan MRN: [id number] Date of Birth: 04-11-1941  Subjective/Objective:       [facility]-PT is recommending OP-PT which Frank Cowan who has a [employer] address will set up through his PCP's office.  Frank Cowan has a RW at home. He has been seen by Palliative Care during this hospitalization.            Action/Plan:   Expected Discharge Date:  09/13/16               Expected Discharge Plan:   09/13/16  In-House Referral:     Discharge planning Services     Post Acute Care Choice:   OP-PT Choice offered to:   Frank Cowan  DME Arranged:   NA DME Agency:   NA  HH Arranged:   OP-PT [place] Agency:   TBD  Status of Service:   Completed  If discussed at [place] of Stay Meetings, dates discussed:    Additional Comments:  Bryker Fletchall A, RN 09/13/2016, 12:18 PM

## 2016-09-13 NOTE — Progress Notes (Signed)
Discharge instructions along with home medications and follow up gone over with patient and daughter. Both verbalize that they understood instructions. One prescription given to patient. Port deaccessed. Pt being discharged home on room air, no distress noted. Ammie Dalton, RN

## 2016-09-14 LAB — ECHOCARDIOGRAM COMPLETE
AOASC: 38 cm
AV peak Index: 1.1
AVAREAVTI: 2.3 cm2
AVPG: 7 mmHg
AVPKVEL: 128 cm/s
Ao pk vel: 0.61 m/s
EERAT: 7.99
FS: 27 % — AB (ref 28–44)
HEIGHTINCHES: 72 in
IVS/LV PW RATIO, ED: 0.7
LA diam end sys: 44 mm
LADIAMINDEX: 2.1 cm/m2
LASIZE: 44 mm
LAVOL: 97.3 mL
LAVOLA4C: 85.1 mL
LAVOLIN: 46.5 mL/m2
LV e' LATERAL: 12.2 cm/s
LVEEAVG: 7.99
LVEEMED: 7.99
LVOT area: 3.8 cm2
LVOT diameter: 22 mm
LVOT peak vel: 77.6 cm/s
MV VTI: 162 cm
MVPG: 4 mmHg
MVPKEVEL: 97.5 m/s
PW: 14.8 mm — AB (ref 0.6–1.1)
TDI e' lateral: 12.2
TDI e' medial: 11
TVMG: 274 mmHg
WEIGHTICAEL: 3020.8 [oz_av]

## 2016-09-15 ENCOUNTER — Ambulatory Visit: Payer: Medicare Other

## 2016-09-16 ENCOUNTER — Ambulatory Visit
Admission: RE | Admit: 2016-09-16 | Discharge: 2016-09-16 | Disposition: A | Discharge status: Home / Self Care | Payer: Medicare Other | Source: Ambulatory Visit | Attending: Radiation Oncology | Admitting: Radiation Oncology

## 2016-09-16 DIAGNOSIS — C3412 Malignant neoplasm of upper lobe, left bronchus or lung: Secondary | ICD-10-CM | POA: Diagnosis present

## 2016-09-16 DIAGNOSIS — R197 Diarrhea, unspecified: Secondary | ICD-10-CM | POA: Diagnosis not present

## 2016-09-16 DIAGNOSIS — Z510 Encounter for antineoplastic radiation therapy: Secondary | ICD-10-CM | POA: Diagnosis not present

## 2016-09-16 LAB — CULTURE, BLOOD (ROUTINE X 2)
CULTURE: NO GROWTH
Culture: NO GROWTH
SPECIAL REQUESTS: ADEQUATE
SPECIAL REQUESTS: ADEQUATE

## 2016-09-17 ENCOUNTER — Ambulatory Visit
Admission: RE | Admit: 2016-09-17 | Discharge: 2016-09-17 | Disposition: A | Discharge status: Home / Self Care | Payer: Medicare Other | Source: Ambulatory Visit | Attending: Radiation Oncology | Admitting: Radiation Oncology

## 2016-09-17 ENCOUNTER — Telehealth: Payer: Self-pay | Admitting: *Deleted

## 2016-09-17 DIAGNOSIS — C3412 Malignant neoplasm of upper lobe, left bronchus or lung: Secondary | ICD-10-CM | POA: Diagnosis not present

## 2016-09-17 NOTE — Telephone Encounter (Signed)
Left message on [name]'s VM that a scheduler will be calling with and appt for next week

## 2016-09-17 NOTE — Telephone Encounter (Signed)
Daughter asking if Frank Cowan needs to see Dr Grayland Ormond as he was discharged form hospital Saturday. He has no FU appts with you, but is getting XRT right now. Please advise

## 2016-09-17 NOTE — Telephone Encounter (Signed)
See me next week with lab and carbo/taxol.

## 2016-09-18 ENCOUNTER — Ambulatory Visit
Admission: RE | Admit: 2016-09-18 | Discharge: 2016-09-18 | Disposition: A | Discharge status: Home / Self Care | Payer: Medicare Other | Source: Ambulatory Visit | Attending: Radiation Oncology | Admitting: Radiation Oncology

## 2016-09-18 ENCOUNTER — Ambulatory Visit: Payer: Medicare Other

## 2016-09-18 DIAGNOSIS — C3412 Malignant neoplasm of upper lobe, left bronchus or lung: Secondary | ICD-10-CM | POA: Diagnosis not present

## 2016-09-19 ENCOUNTER — Ambulatory Visit: Payer: Medicare Other

## 2016-09-19 ENCOUNTER — Ambulatory Visit
Admission: RE | Admit: 2016-09-19 | Discharge: 2016-09-19 | Disposition: A | Discharge status: Home / Self Care | Payer: Medicare Other | Source: Ambulatory Visit | Attending: Radiation Oncology | Admitting: Radiation Oncology

## 2016-09-19 ENCOUNTER — Other Ambulatory Visit: Payer: Self-pay | Admitting: *Deleted

## 2016-09-19 DIAGNOSIS — C3412 Malignant neoplasm of upper lobe, left bronchus or lung: Secondary | ICD-10-CM | POA: Diagnosis not present

## 2016-09-22 ENCOUNTER — Ambulatory Visit: Payer: Medicare Other

## 2016-09-22 ENCOUNTER — Ambulatory Visit
Admission: RE | Admit: 2016-09-22 | Discharge: 2016-09-22 | Disposition: A | Discharge status: Home / Self Care | Payer: Medicare Other | Source: Ambulatory Visit | Attending: Radiation Oncology | Admitting: Radiation Oncology

## 2016-09-22 DIAGNOSIS — C3412 Malignant neoplasm of upper lobe, left bronchus or lung: Secondary | ICD-10-CM | POA: Diagnosis not present

## 2016-09-23 ENCOUNTER — Ambulatory Visit
Admission: RE | Admit: 2016-09-23 | Discharge: 2016-09-23 | Disposition: A | Discharge status: Home / Self Care | Payer: Medicare Other | Source: Ambulatory Visit | Attending: Radiation Oncology | Admitting: Radiation Oncology

## 2016-09-23 ENCOUNTER — Ambulatory Visit: Payer: Medicare Other

## 2016-09-23 DIAGNOSIS — C3412 Malignant neoplasm of upper lobe, left bronchus or lung: Secondary | ICD-10-CM | POA: Diagnosis not present

## 2016-09-24 ENCOUNTER — Ambulatory Visit
Admission: RE | Admit: 2016-09-24 | Discharge: 2016-09-24 | Disposition: A | Discharge status: Home / Self Care | Payer: Medicare Other | Source: Ambulatory Visit | Attending: Radiation Oncology | Admitting: Radiation Oncology

## 2016-09-24 ENCOUNTER — Ambulatory Visit: Payer: Medicare Other

## 2016-09-24 ENCOUNTER — Encounter: Payer: Self-pay | Admitting: Oncology

## 2016-09-24 DIAGNOSIS — C3412 Malignant neoplasm of upper lobe, left bronchus or lung: Secondary | ICD-10-CM | POA: Diagnosis not present

## 2016-09-24 NOTE — Progress Notes (Signed)
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  INTERVAL HISTORY: Patient returns to clinic today for further evaluation and reconsideration of cycle 8 of weekly carboplatin and Taxol along with his XRT boost. He is recently admitted to the hospital, but since discharge his performance status has significantly improved. He continues to have chronic weakness and fatigue. His appetite has improved. He does not complain of leg pain today. He continues to be highly anxious. He has no chest pain, shortness of breath, cough, or hemoptysis. He denies any further nausea or vomiting. He denies any diarrhea. He has no urinary complaints. Patient offers no further specific complaints today.  REVIEW OF SYSTEMS:   Review of Systems  Constitutional: Positive for malaise/fatigue. Negative for fever and weight loss.  HENT: Negative.  Negative for sore throat.   Respiratory: Negative.  Negative for cough, hemoptysis and shortness of breath.   Cardiovascular: Negative.  Negative for chest pain and leg swelling.  Gastrointestinal: Positive for constipation. Negative for abdominal pain, blood in stool, heartburn, nausea and vomiting.  Genitourinary: Negative.  Negative for frequency.  Musculoskeletal: Negative for back pain, joint pain and myalgias.  Skin: Negative.  Negative for rash.  Neurological: Positive for weakness. Negative for sensory change and headaches.  Endo/Heme/Allergies: Does not bruise/bleed easily.  Psychiatric/Behavioral: Positive for memory loss. The patient is nervous/anxious.     As per HPI. Otherwise, a complete review of systems is negative.  PAST MEDICAL HISTORY: Past Medical History:  Diagnosis Date  . Cancer ([place])    lung  . Cataract   . Collagen vascular disease ([place])   . History of kidney stones   . Kidney stones 2014  . Squamous cell lung cancer, right ([place]) 05/14/2016    PAST SURGICAL HISTORY: Past Surgical History:  Procedure Laterality Date  . CYSTOSCOPY W/ URETEROSCOPY W/ LITHOTRIPSY    . ENDOBRONCHIAL ULTRASOUND N/A 06/12/2016   Procedure: ENDOBRONCHIAL ULTRASOUND;  Surgeon: Laverle Hobby, MD;  Location: [facility] ORS;  Service: Pulmonary;  Laterality: N/A;  . PORTA CATH INSERTION N/A 06/26/2016   Procedure: Glori Luis Cath Insertion;  Surgeon: Algernon Huxley, MD;  Location: [place] [facility];  Service: Cardiovascular;  Laterality: N/A;  . TONSILLECTOMY      FAMILY HISTORY: Family History  Problem Relation Age of Onset  . Heart disease Mother   . Heart disease Father   . Cancer Sister 6       luekemia    ADVANCED DIRECTIVES (Y/N):  N  HEALTH MAINTENANCE: Social History  Substance Use Topics  . Smoking status: Former Smoker    Packs/day: 0.50    Years: 55.00    Quit date: 03/28/2016  . Smokeless tobacco: Former Systems developer  . Alcohol use No     Comment: Not lately     Colonoscopy:  PAP:  Bone density:  Lipid panel:  No Known Allergies  Current Outpatient Prescriptions  Medication Sig Dispense Refill  . acetaminophen (TYLENOL) 500 MG tablet Take 500 mg by mouth every 8 (eight) hours as needed for mild pain or headache. On hold for procedure    . bisacodyl (DULCOLAX) 5 MG EC tablet Take 5 mg by mouth daily as needed for moderate constipation.    Marland Kitchen dexamethasone (DECADRON) 4 MG tablet Take 1  tablet (4 mg total) by mouth daily. 30 tablet 1  . esomeprazole (NEXIUM) 40 MG capsule Take 1 capsule (40 mg total) by mouth daily. 30 capsule 1  . FLUoxetine (PROZAC) 10 MG tablet Take 10 mg by mouth daily.    . folic acid (FOLVITE) 1 MG tablet Take 1 mg by mouth daily.     . methotrexate (RHEUMATREX) 2.5 MG tablet Take 20 mg by mouth once a week. Wednesday    . naproxen sodium (ANAPROX) 220  MG tablet Take 220 mg by mouth 2 (two) times daily as needed.     . ondansetron (ZOFRAN) 8 MG tablet Take 1 tablet (8 mg total) by mouth 2 (two) times daily as needed for refractory nausea / vomiting. 60 tablet 2  . oxyCODONE-acetaminophen (PERCOCET/ROXICET) 5-325 MG tablet Take 1-2 tablets by mouth every 6 (six) hours as needed for severe pain. 30 tablet 0  . prochlorperazine (COMPAZINE) 10 MG tablet Take 1 tablet (10 mg total) by mouth every 6 (six) hours as needed (Nausea or vomiting). 60 tablet 2  . tamsulosin (FLOMAX) 0.4 MG CAPS capsule Take 0.4 mg by mouth daily.     Marland Kitchen levofloxacin (LEVAQUIN) 500 MG tablet Take 1 tablet (500 mg total) by mouth daily. (Patient not taking: Reported on 09/25/2016) 5 tablet 0  . lidocaine (XYLOCAINE) 2 % solution Use as directed 20 mLs in the mouth or throat as needed for mouth pain. (Patient not taking: Reported on 09/25/2016) 100 mL 0  . lidocaine-prilocaine (EMLA) cream Apply to affected area once (Patient not taking: Reported on 09/25/2016) 30 g 3  . megestrol (MEGACE) 40 MG tablet Take 1 tablet (40 mg total) by mouth daily. (Patient not taking: Reported on 09/25/2016) 30 tablet 1   No current facility-administered medications for this visit.     OBJECTIVE: Vitals:   09/25/16 0855  BP: 105/71  Pulse: 84  Resp: 20  Temp: 97.2 F (36.2 C)     Body mass index is 22.93 kg/m.    ECOG FS:2 - Symptomatic, <50% confined to bed  General: Well-developed, well-nourished, no acute distress.Sitting in a wheelchair. Eyes: Pink conjunctiva, anicteric sclera. Lungs: Clear to auscultation bilaterally. Heart: Regular rate and rhythm. No rubs, murmurs, or gallops. Abdomen: Soft, nontender, nondistended. No organomegaly noted, normoactive bowel sounds. Musculoskeletal: No edema. Neuro: Alert, answering all questions appropriately. Cranial nerves grossly intact. Skin: No rashes or petechiae noted. Psych: Highly anxious.  LAB RESULTS:  Lab Results  Component Value  Date   NA 133 (L) 09/25/2016   K 4.0 09/25/2016   CL 100 (L) 09/25/2016   CO2 25 09/25/2016   GLUCOSE 125 (H) 09/25/2016   BUN 22 (H) 09/25/2016   CREATININE 0.70 09/25/2016   CALCIUM 8.5 (L) 09/25/2016   PROT 6.0 (L) 09/25/2016   ALBUMIN 3.0 (L) 09/25/2016   AST 59 (H) 09/25/2016   ALT 67 (H) 09/25/2016   ALKPHOS 73 09/25/2016   BILITOT 0.6 09/25/2016   GFRNONAA >60 09/25/2016   GFRAA >60 09/25/2016    Lab Results  Component Value Date   WBC 7.7 09/25/2016   NEUTROABS 6.3 09/25/2016   HGB 10.8 (L) 09/25/2016   HCT 31.0 (L) 09/25/2016   MCV 93.6 09/25/2016   PLT 190 09/25/2016     STUDIES: Ct Angio Chest Pe W Or Wo Contrast  Result Date: 09/11/2016 CLINICAL DATA:  Fatigue and weakness. Low-grade fever. Hypertension. 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 EXAM: CT ANGIOGRAPHY CHEST WITH CONTRAST  TECHNIQUE: Multidetector CT imaging of the chest was performed using the standard protocol during bolus administration of intravenous contrast. Multiplanar CT image reconstructions and MIPs were obtained to evaluate the vascular anatomy. CONTRAST:  75 cc Isovue 370 COMPARISON:  Chest radiographs dated 08/09/2016. PET-CT dated 05/20/2016. Chest CT dated 05/08/2016. FINDINGS: Cardiovascular: Satisfactory opacification of the pulmonary arteries to the segmental level. No evidence of pulmonary embolism. Normal heart size. No pericardial effusion. Atheromatous coronary artery calcifications. Mediastinum/Nodes: The previously demonstrated 1.5 cm right peritracheal node currently measures 6 mm in short axis diameter on image number 34 of series 4. This measured 1.4 cm on 05/20/2016. The previously demonstrated 1.8 cm right hilar node measures 1.3 cm in short axis diameter on image number 42 of series 4. This measured 2.0 cm on 05/20/2016. No new or enlarging lymph nodes are seen. Lungs/Pleura: Interval small bilateral pleural effusions. Interval dense, patchy interstitial  opacity in both upper lobes and in the superior segments of both lower lobes. Also noted is mild right lower lobe atelectasis adjacent to the pleural fluid. Interval partial cavitation of the previously demonstrated left upper lobe mass, currently measuring 4.4 x 4.3 cm on image number 34 of series 6. This measured 6.3 x 6.1 cm on 05/08/2016 and 6.7 x 6.3 cm on 05/20/2016. Interval loculated fluid anterior to the left upper lobe mass, measuring 2.8 x 2.0 cm on image number 34 of series 6. This measures 8 Hounsfield units in density. The more superiorly located 2.0 x 1.9 cm left upper lobe mass seen on 05/08/2016 currently measures 2.2 x 1.9 cm on images 20 and 19 respectively of series 6. This measured 2.5 x 1.8 cm in maximum diameter on 05/20/2016 without hypermetabolic activity. No new lung masses or nodules. Upper Abdomen: A large upper pole left renal cyst is again demonstrated. Musculoskeletal: Thoracic spine degenerative changes. No evidence of bony metastatic disease. Review of the MIP images confirms the above findings. IMPRESSION: 1. No pulmonary emboli. 2. Interval dense bilateral upper lobe and superior segment lower lobe patchy interstitial opacity, compatible with extensive bilateral infectious pneumonitis. 3. Interval small bilateral pleural effusions. 4. Interval decrease in size of the left upper lobe malignancy with interval partial cavitation. 5. Improving metastatic mediastinal and right hilar adenopathy. 6. Stable non hypermetabolic left upper lobe mass more superiorly. 7. Interval oval area of loculated fluid anterior to the left upper lobe malignancy. Electronically Signed   By: Claudie Revering M.D.   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  PLAN:    1. 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 MRI the brain is negative. He will finish his XRT boost tomorrow. We will discontinue chemotherapy at this time. We will get a PET scan in 6 weeks and then patient will return in 1-2 days later for further evaluation and consideration maintenance treatment with Durvalumab every 2 weeks for up to one year.  2. Pain: Unclear etiology, but appears to be musculoskeletal in nature. Significantly improved. Continue Percocet as needed. 3. Poor appetite/weight loss: Improved. Appreciate dietary input.  4. 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 5. Constipation: Continue current treatment as prescribed. 6. Pancytopenia: Improving. Discontinue chemotherapy as above.   Cancer Staging Squamous cell lung cancer, left [facility]) Staging form: Lung, AJCC 8th Edition - Clinical stage from 09/24/2016: Stage  IIIC (cT3, cN3, cM0) - Signed by Lloyd Huger, MD on 09/24/2016  Squamous cell lung cancer, right [facility] Staging form: Lung, AJCC 8th Edition - Clinical stage from 06/19/2016: Stage IIIC (cT3, cN3, cM0) - Signed by Lloyd Huger, MD on 06/19/2016    Lloyd Huger, MD 09/29/16 11:04 AM

## 2016-09-25 ENCOUNTER — Inpatient Hospital Stay: Payer: Medicare Other | Attending: Oncology

## 2016-09-25 ENCOUNTER — Inpatient Hospital Stay: Payer: Medicare Other

## 2016-09-25 ENCOUNTER — Ambulatory Visit
Admission: RE | Admit: 2016-09-25 | Discharge: 2016-09-25 | Disposition: A | Discharge status: Home / Self Care | Payer: Medicare Other | Source: Ambulatory Visit | Attending: Radiation Oncology | Admitting: Radiation Oncology

## 2016-09-25 ENCOUNTER — Inpatient Hospital Stay (HOSPITAL_BASED_OUTPATIENT_CLINIC_OR_DEPARTMENT_OTHER): Payer: Medicare Other | Admitting: Oncology

## 2016-09-25 ENCOUNTER — Ambulatory Visit: Payer: Medicare Other

## 2016-09-25 VITALS — BP 105/71 | HR 84 | Temp 97.2°F | Resp 20 | Wt 169.1 lb

## 2016-09-25 DIAGNOSIS — K5900 Constipation, unspecified: Secondary | ICD-10-CM | POA: Insufficient documentation

## 2016-09-25 DIAGNOSIS — Z87891 Personal history of nicotine dependence: Secondary | ICD-10-CM

## 2016-09-25 DIAGNOSIS — N281 Cyst of kidney, acquired: Secondary | ICD-10-CM | POA: Insufficient documentation

## 2016-09-25 DIAGNOSIS — Z806 Family history of leukemia: Secondary | ICD-10-CM | POA: Diagnosis not present

## 2016-09-25 DIAGNOSIS — I899 Noninfective disorder of lymphatic vessels and lymph nodes, unspecified: Secondary | ICD-10-CM

## 2016-09-25 DIAGNOSIS — C3492 Malignant neoplasm of unspecified part of left bronchus or lung: Secondary | ICD-10-CM

## 2016-09-25 DIAGNOSIS — R509 Fever, unspecified: Secondary | ICD-10-CM | POA: Insufficient documentation

## 2016-09-25 DIAGNOSIS — R531 Weakness: Secondary | ICD-10-CM | POA: Insufficient documentation

## 2016-09-25 DIAGNOSIS — R5383 Other fatigue: Secondary | ICD-10-CM | POA: Insufficient documentation

## 2016-09-25 DIAGNOSIS — J90 Pleural effusion, not elsewhere classified: Secondary | ICD-10-CM | POA: Diagnosis not present

## 2016-09-25 DIAGNOSIS — D61818 Other pancytopenia: Secondary | ICD-10-CM

## 2016-09-25 DIAGNOSIS — Z923 Personal history of irradiation: Secondary | ICD-10-CM

## 2016-09-25 DIAGNOSIS — I10 Essential (primary) hypertension: Secondary | ICD-10-CM

## 2016-09-25 DIAGNOSIS — Z87442 Personal history of urinary calculi: Secondary | ICD-10-CM | POA: Insufficient documentation

## 2016-09-25 DIAGNOSIS — R590 Localized enlarged lymph nodes: Secondary | ICD-10-CM | POA: Insufficient documentation

## 2016-09-25 DIAGNOSIS — C3412 Malignant neoplasm of upper lobe, left bronchus or lung: Secondary | ICD-10-CM

## 2016-09-25 DIAGNOSIS — F0390 Unspecified dementia without behavioral disturbance: Secondary | ICD-10-CM | POA: Diagnosis not present

## 2016-09-25 DIAGNOSIS — Z79899 Other long term (current) drug therapy: Secondary | ICD-10-CM | POA: Insufficient documentation

## 2016-09-25 DIAGNOSIS — C3491 Malignant neoplasm of unspecified part of right bronchus or lung: Secondary | ICD-10-CM

## 2016-09-25 LAB — CBC WITH DIFFERENTIAL/PLATELET
BASOS ABS: 0 10*3/uL (ref 0–0.1)
Basophils Relative: 0 %
EOS ABS: 0 10*3/uL (ref 0–0.7)
EOS PCT: 0 %
HCT: 31 % — ABNORMAL LOW (ref 40.0–52.0)
Hemoglobin: 10.8 g/dL — ABNORMAL LOW (ref 13.0–18.0)
LYMPHS PCT: 6 %
Lymphs Abs: 0.5 10*3/uL — ABNORMAL LOW (ref 1.0–3.6)
MCH: 32.5 pg (ref 26.0–34.0)
MCHC: 34.7 g/dL (ref 32.0–36.0)
MCV: 93.6 fL (ref 80.0–100.0)
Monocytes Absolute: 0.8 10*3/uL (ref 0.2–1.0)
Monocytes Relative: 10 %
NEUTROS PCT: 84 %
Neutro Abs: 6.3 10*3/uL (ref 1.4–6.5)
PLATELETS: 190 10*3/uL (ref 150–440)
RBC: 3.31 MIL/uL — AB (ref 4.40–5.90)
RDW: 18.1 % — ABNORMAL HIGH (ref 11.5–14.5)
WBC: 7.7 10*3/uL (ref 3.8–10.6)

## 2016-09-25 LAB — COMPREHENSIVE METABOLIC PANEL
ALT: 67 U/L — AB (ref 17–63)
AST: 59 U/L — AB (ref 15–41)
Albumin: 3 g/dL — ABNORMAL LOW (ref 3.5–5.0)
Alkaline Phosphatase: 73 U/L (ref 38–126)
Anion gap: 8 (ref 5–15)
BUN: 22 mg/dL — AB (ref 6–20)
CHLORIDE: 100 mmol/L — AB (ref 101–111)
CO2: 25 mmol/L (ref 22–32)
CREATININE: 0.7 mg/dL (ref 0.61–1.24)
Calcium: 8.5 mg/dL — ABNORMAL LOW (ref 8.9–10.3)
GFR calc Af Amer: >60 mL/min (ref >60)
GFR calc non Af Amer: >60 mL/min (ref >60)
GLUCOSE: 125 mg/dL — AB (ref 65–99)
Potassium: 4 mmol/L (ref 3.5–5.1)
SODIUM: 133 mmol/L — AB (ref 135–145)
Total Bilirubin: 0.6 mg/dL (ref 0.3–1.2)
Total Protein: 6 g/dL — ABNORMAL LOW (ref 6.5–8.1)

## 2016-09-25 MED ORDER — HEPARIN SOD (PORK) LOCK FLUSH 100 UNIT/ML IV SOLN: 500.0000 [IU] | Freq: Once | INTRAVENOUS | Status: DC

## 2016-09-25 MED ORDER — SODIUM CHLORIDE 0.9% FLUSH
10.0000 mL | INTRAVENOUS | Status: DC | PRN
  Administered 2016-09-25: 10 mL via INTRAVENOUS
  Filled 2016-09-25: qty 10

## 2016-09-25 MED ORDER — HEPARIN SOD (PORK) LOCK FLUSH 100 UNIT/ML IV SOLN
500.0000 [IU] | Freq: Once | INTRAVENOUS | Status: AC
  Administered 2016-09-25: 500 [IU] via INTRAVENOUS

## 2016-09-25 NOTE — Progress Notes (Signed)
Patient denies any concerns today.  

## 2016-09-26 ENCOUNTER — Ambulatory Visit
Admission: RE | Admit: 2016-09-26 | Discharge: 2016-09-26 | Disposition: A | Discharge status: Home / Self Care | Payer: Medicare Other | Source: Ambulatory Visit | Attending: Radiation Oncology | Admitting: Radiation Oncology

## 2016-09-26 DIAGNOSIS — C3412 Malignant neoplasm of upper lobe, left bronchus or lung: Secondary | ICD-10-CM | POA: Diagnosis not present

## 2016-10-09 ENCOUNTER — Encounter: Payer: Self-pay | Admitting: Emergency Medicine

## 2016-10-09 ENCOUNTER — Emergency Department: Payer: Medicare Other

## 2016-10-09 ENCOUNTER — Emergency Department
Admission: EM | Admit: 2016-10-09 | Discharge: 2016-10-10 | Disposition: A | Discharge status: Home / Self Care | Payer: Medicare Other | Attending: Emergency Medicine | Admitting: Emergency Medicine

## 2016-10-09 DIAGNOSIS — Z85118 Personal history of other malignant neoplasm of bronchus and lung: Secondary | ICD-10-CM | POA: Diagnosis not present

## 2016-10-09 DIAGNOSIS — Y929 Unspecified place or not applicable: Secondary | ICD-10-CM | POA: Insufficient documentation

## 2016-10-09 DIAGNOSIS — Z87891 Personal history of nicotine dependence: Secondary | ICD-10-CM | POA: Insufficient documentation

## 2016-10-09 DIAGNOSIS — F329 Major depressive disorder, single episode, unspecified: Secondary | ICD-10-CM | POA: Diagnosis not present

## 2016-10-09 DIAGNOSIS — Y939 Activity, unspecified: Secondary | ICD-10-CM | POA: Diagnosis not present

## 2016-10-09 DIAGNOSIS — M6281 Muscle weakness (generalized): Secondary | ICD-10-CM | POA: Insufficient documentation

## 2016-10-09 DIAGNOSIS — Y999 Unspecified external cause status: Secondary | ICD-10-CM | POA: Insufficient documentation

## 2016-10-09 DIAGNOSIS — E860 Dehydration: Secondary | ICD-10-CM

## 2016-10-09 DIAGNOSIS — R531 Weakness: Secondary | ICD-10-CM

## 2016-10-09 DIAGNOSIS — W19XXXA Unspecified fall, initial encounter: Secondary | ICD-10-CM

## 2016-10-09 DIAGNOSIS — F32A Depression, unspecified: Secondary | ICD-10-CM

## 2016-10-09 LAB — BASIC METABOLIC PANEL
ANION GAP: 7 (ref 5–15)
BUN: 27 mg/dL — ABNORMAL HIGH (ref 6–20)
CHLORIDE: 105 mmol/L (ref 101–111)
CO2: 25 mmol/L (ref 22–32)
CREATININE: 0.95 mg/dL (ref 0.61–1.24)
Calcium: 8.5 mg/dL — ABNORMAL LOW (ref 8.9–10.3)
GFR calc non Af Amer: >60 mL/min (ref >60)
Glucose, Bld: 185 mg/dL — ABNORMAL HIGH (ref 65–99)
POTASSIUM: 4 mmol/L (ref 3.5–5.1)
SODIUM: 137 mmol/L (ref 135–145)

## 2016-10-09 LAB — CBC
HEMATOCRIT: 36.3 % — AB (ref 40.0–52.0)
HEMOGLOBIN: 12.2 g/dL — AB (ref 13.0–18.0)
MCH: 31.9 pg (ref 26.0–34.0)
MCHC: 33.6 g/dL (ref 32.0–36.0)
MCV: 94.7 fL (ref 80.0–100.0)
Platelets: 83 10*3/uL — ABNORMAL LOW (ref 150–440)
RBC: 3.83 MIL/uL — AB (ref 4.40–5.90)
RDW: 17.9 % — ABNORMAL HIGH (ref 11.5–14.5)
WBC: 6.6 10*3/uL (ref 3.8–10.6)

## 2016-10-09 LAB — TROPONIN I: Troponin I: <0.03 ng/mL (ref <0.03)

## 2016-10-09 NOTE — ED Notes (Addendum)
Pt hx of lung cancer, last received chemo >6wks and radiation x2wks ago. Pt recently admitted for pneumonia, d/c  6/2, states increased weakness and falls x1wk, denies fever or n/v/d. Family at bedside

## 2016-10-09 NOTE — ED Triage Notes (Signed)
Pt to triage via [place], reports lung cancer, chemo stopped a month ago, last radiation tx 2 weeks ago.  Pt reports increased weakness over past week w/ multiple falls, report pain to right shoulder.

## 2016-10-09 NOTE — ED Provider Notes (Addendum)
[facility] Emergency Department Provider Note   ____________________________________________   First MD Initiated Contact with Patient 10/09/16 2314     (approximate)  I have reviewed the triage vital signs and the nursing notes.   HISTORY  Chief Complaint Weakness; Fall; and Shoulder Pain    HPI Frank Cowan is a 76 y.o. male who presents to the ED from home with a chief complaint of generalized weakness and falls. Patient has a history of lung cancer, status post chemotherapy over 6 weeks ago and radiation over 2 weeks agobrought by his daughter for generalized weakness and fall several days ago. Daughter did not know that he fell until tonight when he told her. States he fell secondary to generalized weakness. Has pre-existing left shoulder pain times one year; now right shoulder hurting status post fall. Daughter spoke with patient's oncologist Dr. Grayland Ormond who would be able to see him tomorrow but daughter concerned because she expected patient would be improving and regaining his strength by now but instead he seems like he is regressing. She also tells me that he was placed on Prozac 20 mg daily 3 weeks ago but remains depressed with poor appetite. Denies suicidal thoughts. Daughter does not think patient would intentionally harm himself. Patient complains of nonproductive cough. Was hospitalized last month for pneumonia. Denies associated fever, chills, chest pain, shortness of breath, abdominal pain, nausea, vomiting, dysuria. Denies recent travel or trauma. Nothing makes his symptoms better or worse.   Past Medical History:  Diagnosis Date  . Cancer ([place])    lung  . Cataract   . Collagen vascular disease ([place])   . History of kidney stones   . Kidney stones 2014  . Squamous cell lung cancer, right ([place]) 05/14/2016    Patient Active Problem List   Diagnosis Date Noted  . Fever   . Squamous cell lung cancer, left ([place])   . Palliative care by  specialist   . Failure to thrive in adult 09/10/2016  . Goals of care, counseling/discussion 06/22/2016  . Squamous cell lung cancer, right ([place]) 05/14/2016    Past Surgical History:  Procedure Laterality Date  . CYSTOSCOPY W/ URETEROSCOPY W/ LITHOTRIPSY    . ENDOBRONCHIAL ULTRASOUND N/A 06/12/2016   Procedure: ENDOBRONCHIAL ULTRASOUND;  Surgeon: Laverle Hobby, MD;  Location: [facility] ORS;  Service: Pulmonary;  Laterality: N/A;  . PORTA CATH INSERTION N/A 06/26/2016   Procedure: Glori Luis Cath Insertion;  Surgeon: Algernon Huxley, MD;  Location: [place] [facility];  Service: Cardiovascular;  Laterality: N/A;  . TONSILLECTOMY      Prior to Admission medications   Medication Sig Start Date End Date Taking? Authorizing Provider  acetaminophen (TYLENOL) 500 MG tablet Take 500 mg by mouth every 8 (eight) hours as needed for mild pain or headache. On hold for procedure    [provider]  bisacodyl (DULCOLAX) 5 MG EC tablet Take 5 mg by mouth daily as needed for moderate constipation.    [provider]  dexamethasone (DECADRON) 4 MG tablet Take 1 tablet (4 mg total) by mouth daily. 08/13/16   Lloyd Huger, MD  esomeprazole (NEXIUM) 40 MG capsule Take 1 capsule (40 mg total) by mouth daily. 08/02/16 08/02/17  Earleen Newport, MD  FLUoxetine (PROZAC) 10 MG tablet Take 10 mg by mouth daily.    [provider]  folic acid (FOLVITE) 1 MG tablet Take 1 mg by mouth daily.  09/13/13   [provider]  levofloxacin (LEVAQUIN) 500  MG tablet Take 1 tablet (500 mg total) by mouth daily. Patient not taking: Reported on 09/25/2016 09/13/16   Henreitta Leber, MD  lidocaine (XYLOCAINE) 2 % solution Use as directed 20 mLs in the mouth or throat as needed for mouth pain. Patient not taking: Reported on 09/25/2016 08/02/16   Earleen Newport, MD  megestrol (MEGACE) 40 MG tablet Take 1 tablet (40 mg total) by mouth daily. Patient not taking: Reported on 09/25/2016 06/30/16    Jacquelin Hawking, NP  methotrexate (RHEUMATREX) 2.5 MG tablet Take 20 mg by mouth once a week. Wednesday 05/14/16   [provider]  naproxen sodium (ANAPROX) 220 MG tablet Take 220 mg by mouth 2 (two) times daily as needed.     [provider]  oxyCODONE-acetaminophen (PERCOCET/ROXICET) 5-325 MG tablet Take 1-2 tablets by mouth every 6 (six) hours as needed for severe pain. 09/02/16   Lloyd Huger, MD  tamsulosin (FLOMAX) 0.4 MG CAPS capsule Take 0.4 mg by mouth daily.  05/13/16 05/13/17  [provider]    Allergies Patient has no known allergies.  Family History  Problem Relation Age of Onset  . Heart disease Mother   . Heart disease Father   . Cancer Sister 79       luekemia    Social History Social History  Substance Use Topics  . Smoking status: Former Smoker    Packs/day: 0.50    Years: 55.00    Quit date: 03/28/2016  . Smokeless tobacco: Former Systems developer  . Alcohol use No     Comment: Not lately    Review of Systems  Constitutional: Positive for generalized weakness. No fever/chills. Eyes: No visual changes. ENT: No sore throat. Cardiovascular: Denies chest pain. Respiratory: Positive for nonproductive cough. Denies shortness of breath. Gastrointestinal: No abdominal pain.  No nausea, no vomiting.  No diarrhea.  No constipation. Genitourinary: Negative for dysuria. Musculoskeletal: Negative for back pain. Skin: Negative for rash. Neurological: Negative for headaches, focal weakness or numbness.   ____________________________________________   PHYSICAL EXAM:  VITAL SIGNS: ED Triage Vitals  Enc Vitals Group     BP 10/09/16 2126 102/71     Pulse Rate 10/09/16 2126 89     Resp 10/09/16 2126 18     Temp 10/09/16 2126 97.7 F (36.5 C)     Temp Source 10/09/16 2126 Oral     SpO2 10/09/16 2126 95 %     Weight 10/09/16 2126 169 lb (76.7 kg)     Height 10/09/16 2126 6' (1.829 m)     Head Circumference --      Peak Flow --      Pain  Score 10/09/16 2125 3     Pain Loc --      Pain Edu? --      Excl. in [place]? --     Constitutional: Alert and oriented. Well appearing and in no acute distress. Eyes: Conjunctivae are normal. PERRL. EOMI. Head: Atraumatic. Nose: No congestion/rhinnorhea. Mouth/Throat: Mucous membranes are moist.  Oropharynx non-erythematous. Neck: No stridor.  No carotid bruits. Cardiovascular: Normal rate, irregular rhythm. Grossly normal heart sounds.  Good peripheral circulation. Respiratory: Normal respiratory effort.  No retractions. Lungs CTAB. Gastrointestinal: Soft and nontender. No distention. No abdominal bruits. No CVA tenderness. Musculoskeletal: Bilateral shoulders tender to palpation but with full range of motion bilaterally. 2+ radial pulses. Brisk, less than 5 second capillary refill. No lower extremity tenderness nor edema.  No joint effusions. Neurologic:  Normal speech and language.  No gross focal neurologic deficits are appreciated.  Skin:  Skin is warm, dry and intact. No rash noted. Psychiatric: Mood and affect are normal. Speech and behavior are normal.  ____________________________________________   LABS (all labs ordered are listed, but only abnormal results are displayed)  Labs Reviewed  BASIC METABOLIC PANEL - Abnormal; Notable for the following:       Result Value   Glucose, Bld 185 (*)    BUN 27 (*)    Calcium 8.5 (*)    All other components within normal limits  CBC - Abnormal; Notable for the following:    RBC 3.83 (*)    Hemoglobin 12.2 (*)    HCT 36.3 (*)    RDW 17.9 (*)    Platelets 83 (*)    All other components within normal limits  URINALYSIS, COMPLETE (UACMP) WITH MICROSCOPIC - Abnormal; Notable for the following:    Color, Urine YELLOW (*)    APPearance CLEAR (*)    Glucose, UA 50 (*)    Hgb urine dipstick MODERATE (*)    All other components within normal limits  TROPONIN I  TROPONIN I   ____________________________________________  EKG  ED ECG  REPORT I, Paislee Szatkowski J, the attending physician, personally viewed and interpreted this ECG.   Date: 10/10/2016  EKG Time: 2129  Rate: 93  Rhythm: atrial fibrillation, rate 93  Axis: Normal  Intervals:none  ST&T Change: Nonspecific  ____________________________________________  RADIOLOGY  Dg Chest 2 View  Result Date: 10/10/2016 CLINICAL DATA:  History of lung cancer EXAM: CHEST  2 VIEW COMPARISON:  CT 09/11/2016, radiograph 08/09/2016 FINDINGS: Right-sided central venous port tip overlies the upper SVC. No pleural effusion. Spiculated right hilar opacity, increased compared to radiograph April 2018 but similar compared with CT 09/11/2016. Partially cavitary mass in the left upper lobe, now with surrounding ground-glass density. Scarring or atelectasis at the left base. No pneumothorax. IMPRESSION: 1. Decreased cavitary mass in the left upper lobe but with surrounding ground-glass density, similar to most recent CT. 2. Spiculated opacity in the right hilum, increased compared to prior radiograph but probably stable compared to the interval chest CT. Electronically Signed   By: Donavan Foil M.D.   On: 10/10/2016 00:09   Dg Shoulder Right  Result Date: 10/10/2016 CLINICAL DATA:  Right shoulder pain EXAM: RIGHT SHOULDER - 2+ VIEW COMPARISON:  09/11/2016 FINDINGS: Partially visualize right-sided port. Right hilar adenopathy or mass noted on previous imaging. Mild AC joint degenerative change. No fracture or dislocation. IMPRESSION: 1. AC joint degenerative changes.  No acute osseous abnormality 2. Right hilar mass as noted on prior imaging studies Electronically Signed   By: Donavan Foil M.D.   On: 10/10/2016 00:04   Dg Shoulder Left  Result Date: 10/10/2016 CLINICAL DATA:  Left shoulder pain EXAM: LEFT SHOULDER - 2+ VIEW COMPARISON:  08/09/2016, CT 09/11/2016 FINDINGS: No fracture or dislocation is seen. AC joint degenerative changes. High-riding humeral head. Partially visualized perihilar  nodular opacity. IMPRESSION: 1. No acute osseous abnormality 2. High riding humeral head suggests rotator cuff pathology 3. AC joint degenerative change 4. Partially visualized nodular opacities in the perihilar region Electronically Signed   By: Donavan Foil M.D.   On: 10/10/2016 00:03    ____________________________________________   PROCEDURES  Procedure(s) performed: None  Procedures  Critical Care performed: No  ____________________________________________   INITIAL IMPRESSION / ASSESSMENT AND PLAN / ED COURSE  Pertinent labs & imaging results that were available during my care of the patient were reviewed  by me and considered in my medical decision making (see chart for details).  76 year old male with lung cancer status post chemotherapy and radiation who presents with generalized weakness and fall several days ago secondary to weakness. Daughter tells me she is concern for patient's mental health state but does not think he would intentionally harm himself. We discussed doing a medical evaluation initially and attempted to ambulate the patient to assess his gait. If he does not meet admission criteria, that she would be interested in physical therapy and social work consult to see if he would meet qualification for rehabilitation or home health. I did offer mental health consultation but they declined.  Clinical Course as of Oct 11 643  Fri Oct 10, 2016  0207 Obtained printed copies of patient's chest x-ray and bilateral shoulder x-ray results. No acute abnormalities. Patient providing urine specimen now. Repeat troponin remains negative. Discussed with daughter; there is no medical criteria for admission. Patient will remain in the emergency department overnight pending social work and physical therapy consults in the morning.  [JS]  [id number] Patient sleeping in no acute distress. Awaiting clinical social work and physical therapy consults. Care transferred to oncoming provider for  final disposition.  [JS]    Clinical Course User Index [JS] Paulette Blanch, MD     ____________________________________________   FINAL CLINICAL IMPRESSION(S) / ED DIAGNOSES  Final diagnoses:  Generalized weakness  Fall, initial encounter  Depression, unspecified depression type      NEW MEDICATIONS STARTED DURING THIS VISIT:  New Prescriptions   No medications on file     Note:  This document was prepared using Dragon voice recognition software and may include unintentional dictation errors.    Paulette Blanch, MD 10/10/16 2841    Paulette Blanch, MD 10/10/16 [phone]

## 2016-10-10 LAB — URINALYSIS, COMPLETE (UACMP) WITH MICROSCOPIC
BILIRUBIN URINE: NEGATIVE
Bacteria, UA: NONE SEEN
Glucose, UA: 50 mg/dL — AB
Ketones, ur: NEGATIVE mg/dL
LEUKOCYTES UA: NEGATIVE
Nitrite: NEGATIVE
PH: 5 (ref 5.0–8.0)
Protein, ur: NEGATIVE mg/dL
SPECIFIC GRAVITY, URINE: 1.021 (ref 1.005–1.030)
SQUAMOUS EPITHELIAL / LPF: NONE SEEN

## 2016-10-10 LAB — TROPONIN I: Troponin I: <0.03 ng/mL (ref <0.03)

## 2016-10-10 MED ORDER — SODIUM CHLORIDE 0.9 % IV BOLUS (SEPSIS)
1000.0000 mL | Freq: Once | INTRAVENOUS | Status: AC
  Administered 2016-10-10: 1000 mL via INTRAVENOUS

## 2016-10-10 MED ORDER — HEPARIN SOD (PORK) LOCK FLUSH 10 UNIT/ML IV SOLN
10.0000 [IU] | Freq: Once | INTRAVENOUS | Status: AC
  Administered 2016-10-10: 10 [IU] via INTRAVENOUS
  Filled 2016-10-10: qty 1

## 2016-10-10 MED ORDER — SODIUM CHLORIDE 0.9 % IV BOLUS (SEPSIS)
1000.0000 mL | Freq: Once | INTRAVENOUS | Status: AC
Start: 2016-10-10 — End: 2016-10-10
  Administered 2016-10-10: 1000 mL via INTRAVENOUS

## 2016-10-10 NOTE — ED Notes (Signed)
Frank Cowan talked extensively to patient. Patient understands discharge instructions about PT.

## 2016-10-10 NOTE — Discharge Instructions (Signed)
Follow-up with physical therapy 2-3 times a week.  Call your doctor for an appointment as soon as possible for follow-up.

## 2016-10-10 NOTE — ED Notes (Signed)
Frank Cowan [phone]

## 2016-10-10 NOTE — ED Provider Notes (Signed)
Case manager Sharyn Lull contacted physical therapy which is 3 miles from the patient's house. His neighbor will take him home. His neighbor can drive him to physical therapy 2-3 times a week. Prescription provided to initiate treatment with the outpatient physical therapy at the patient's request. Now medically stable and back to baseline after vigorous hydration in the ED.   Carrie Mew, MD 10/10/16 [phone]

## 2016-10-10 NOTE — ED Provider Notes (Signed)
Patient with orthostatic vital signs and symptomatic orthostasis. However, after 2 L of fluid the patient's blood pressure is no longer changing orthostatically. However, his heart rate did go from 60s to 90s from lying down to standing. He says that he does feel mildly lightheaded when standing. However, he says that he has been feeling this way for several weeks. Patient says that he was undergoing multiple radiation treatments recently and that he began feeling lightheaded into the course of his treatments. At this time we are waiting for the resolution of his outpatient physical therapy plan. Encouraged the patient to drink plenty of fluids. He says that he would like to do outpatient local physical therapy Center. Signed out to Dr. Joni Fears.   Frank Pyo, MD 10/10/16 [phone]

## 2016-10-10 NOTE — Clinical Social Work Note (Signed)
CSW consulted for potential STR versus HH. PT contacted CSW and they are recommending home with home health. Please re-consult CSW if needed. Shela Leff MSW,LCSW [phone]

## 2016-10-10 NOTE — Evaluation (Signed)
Physical Therapy Evaluation Patient Details Name: Frank Cowan MRN: [id number] DOB: Sep 28, 1940 Today's Date: 10/10/2016   History of Present Illness  presented to ER secondary to progressive weakness, multiple falls.  Of note, currently receiving chemo (last treat approx 5 weeks) and radiation (last treat approx 2 weeks); recent admission from cancer center due to PNA (DC on 6/2, recommended for outpatient PT)  Clinical Impression  Upon evaluation, patient alert and oriented; follows simple commands, though requires increased encouragement for participation.  Generally disinterested and disengaged with session; visibly discouraged/frustrated with situation.  Demonstrates ability to complete bed mobility with mod indep; sit/stand, basic transfers and short-distance (25') gait without assist device, cga/close sup.  Poor ability to maintain balance with dynamic components and activities requiring altered BOS/SLS; high all risk.  Refuses use of RW (though recommended for use at all times) and indicates limited willingness to integrate therapist education/cuing.  Additional gait distance deferred secondary to positive orthostatics and inconsistent chronotropic response to activity (HR dipping to 30s at times during session).  RN informed/aware. Would benefit from skilled PT to address above deficits and promote optimal return to PLOF; Recommend transition to [facility] upon discharge from acute hospitalization when medically appropriate (orthostatics resolve)     Follow Up Recommendations Home health PT    Equipment Recommendations   (recommend consistent use of RW (has one in home already))    Recommendations for Other Services       Precautions / Restrictions Precautions Precautions: Fall Restrictions Weight Bearing Restrictions: No      Mobility  Bed Mobility Overal bed mobility: Modified Independent                Transfers Overall transfer level: Needs assistance Equipment used:  None Transfers: Sit to/from Stand Sit to Stand: Min guard;Supervision         General transfer comment: impulsive, often standing without cuing  Ambulation/Gait Ambulation/Gait assistance: Min guard;Supervision Ambulation Distance (Feet): 25 Feet Assistive device: None       General Gait Details: forward flexed posture with broad BOS, intermittent reaching for walls/furniture for external stabilization; refuses use of RW at this time.  Mod reports of dizziness with upright positioning  Stairs            Wheelchair Mobility    Modified Rankin (Stroke Patients Only)       Balance Overall balance assessment: Needs assistance Sitting-balance support: No upper extremity supported;Feet supported Sitting balance-Leahy Scale: Good     Standing balance support: No upper extremity supported Standing balance-Leahy Scale: Fair   Single Leg Stance - Right Leg: 0 Single Leg Stance - Left Leg: 0                         Pertinent Vitals/Pain Pain Assessment: No/denies pain    Home Living Family/patient expects to be discharged to:: Private residence Living Arrangements: Spouse/significant other Available Help at Discharge: Family Type of Home: House Home Access: Ramped entrance     Home Layout: One level Home Equipment: Environmental consultant - 2 wheels;Wheelchair - manual;Bedside commode      Prior Function Level of Independence: Independent         Comments: Independent ADLs/IADLs, drives, ambulates without assistive device. Has used RW intermittently since last admission due to persistent weakness.     Hand Dominance   Dominant Hand: Right    Extremity/Trunk Assessment   Upper Extremity Assessment Upper Extremity Assessment: Overall WFL for tasks assessed (baseline ROM deficits (chronic  shoulder injury), but functional for basic transfers and mobility)    Lower Extremity Assessment Lower Extremity Assessment: Overall WFL for tasks assessed (grossly 4-/5  throughout bilat LEs)       Communication   Communication: No difficulties  Cognition Arousal/Alertness: Awake/alert Behavior During Therapy: Flat affect Overall Cognitive Status: Within Functional Limits for tasks assessed                                 General Comments: generally disinterested and disengaged througout session; irritable and frustrated with situation      General Comments      Exercises     Assessment/Plan    PT Assessment Patient needs continued PT services  PT Problem List Decreased strength;Decreased activity tolerance;Decreased balance;Decreased range of motion;Decreased mobility;Decreased knowledge of use of DME;Decreased safety awareness;Cardiopulmonary status limiting activity;Decreased knowledge of precautions       PT Treatment Interventions DME instruction;Gait training;Stair training;Functional mobility training;Therapeutic activities;Therapeutic exercise;Balance training;Patient/family education    PT Goals (Current goals can be found in the Care Plan section)  Acute Rehab PT Goals Patient Stated Goal: to return home PT Goal Formulation: With patient Time For Goal Achievement: 10/24/16 Potential to Achieve Goals: Fair    Frequency Min 2X/week   Barriers to discharge Decreased caregiver support      Co-evaluation               AM-PAC PT "6 Clicks" Daily Activity  Outcome Measure Difficulty turning over in bed (including adjusting bedclothes, sheets and blankets)?: None Difficulty moving from lying on back to sitting on the side of the bed? : None Difficulty sitting down on and standing up from a chair with arms (e.g., wheelchair, bedside commode, etc,.)?: A Little Help needed moving to and from a bed to chair (including a wheelchair)?: A Little Help needed walking in hospital room?: A Little Help needed climbing 3-5 steps with a railing? : A Little 6 Click Score: 20    End of Session Equipment Utilized During  Treatment: Gait belt Activity Tolerance: Patient tolerated treatment well Patient left: in bed;with call bell/phone within reach Nurse Communication: Mobility status (orthostatics, response to upright) PT Visit Diagnosis: Muscle weakness (generalized) (M62.81);Difficulty in walking, not elsewhere classified (R26.2);History of falling (Z91.81)    Time: 8295-6213 PT Time Calculation (min) (ACUTE ONLY): 36 min   Charges:   PT Evaluation $PT Eval Low Complexity: 1 Procedure     PT G Codes:   PT G-Codes **NOT FOR INPATIENT CLASS** Functional Assessment Tool Used: AM-PAC 6 Clicks Basic Mobility Functional Limitation: Mobility: Walking and moving around Mobility: Walking and Moving Around Current Status (Y8657): At least 20 percent but less than 40 percent impaired, limited or restricted Mobility: Walking and Moving Around Goal Status [phone]): At least 1 percent but less than 20 percent impaired, limited or restricted    Sanye Ledesma H. Owens Shark, PT, DPT, NCS 10/10/16, 10:30 AM [phone]

## 2016-10-10 NOTE — ED Notes (Signed)
Physician notified of patient's orthostatic vital signs. Fluid ordered obtained.

## 2016-10-10 NOTE — Care Management Note (Signed)
Case Management Note  Patient Details  Name: Frank Cowan MRN: [id number] Date of Birth: June 16, 1940  Subjective/Objective:   Outpatient physical therapy 2-3 times a week                 Action/Plan:PT recommended home health PT patient requested outpatient PT at [facility] in [place]. Spoke with Stanton Kidney anticipates start date end of July but will call patient to try and set up earlier time. [address] [place] [address]. Information faxed to [phone]   Expected Discharge Date:                  Expected Discharge Plan:     In-House Referral:     Discharge planning Services     Post Acute Care Choice:    Choice offered to:     DME Arranged:    DME Agency:     HH Arranged:    HH Agency:     Status of Service:     If discussed at [employer], dates discussed:    Additional Comments:  Ival Bible, RN 10/10/2016, 3:47 PM

## 2016-10-10 NOTE — ED Notes (Signed)
Port de-accessed, flushed with heparin.

## 2016-10-10 NOTE — ED Notes (Signed)
Daughter at bedside, updated on status. Waiting on social work. Pt recommends in home PT. Verbalized understanding and aware we are monitoring blood pressure.

## 2016-10-15 ENCOUNTER — Other Ambulatory Visit: Payer: Self-pay | Admitting: Oncology

## 2016-10-27 ENCOUNTER — Telehealth: Payer: Self-pay | Admitting: *Deleted

## 2016-10-27 ENCOUNTER — Ambulatory Visit
Admission: RE | Admit: 2016-10-27 | Discharge: 2016-10-27 | Disposition: A | Discharge status: Home / Self Care | Payer: Medicare Other | Source: Ambulatory Visit | Attending: Radiation Oncology | Admitting: Radiation Oncology

## 2016-10-27 ENCOUNTER — Encounter: Payer: Self-pay | Admitting: Radiation Oncology

## 2016-10-27 VITALS — BP 98/69 | HR 78 | Temp 97.6°F

## 2016-10-27 DIAGNOSIS — C3412 Malignant neoplasm of upper lobe, left bronchus or lung: Secondary | ICD-10-CM

## 2016-10-27 DIAGNOSIS — C3411 Malignant neoplasm of upper lobe, right bronchus or lung: Secondary | ICD-10-CM | POA: Insufficient documentation

## 2016-10-27 DIAGNOSIS — Z923 Personal history of irradiation: Secondary | ICD-10-CM | POA: Insufficient documentation

## 2016-10-27 DIAGNOSIS — Z9221 Personal history of antineoplastic chemotherapy: Secondary | ICD-10-CM | POA: Diagnosis not present

## 2016-10-27 NOTE — Progress Notes (Signed)
Radiation Oncology Follow up Note  Name: Frank Cowan   Date:   10/27/2016 MRN:  [id number] DOB: April 27, 1940    This 76 y.o. 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 Seen today in routine follow-up he is doing much better his energy level is improved she's not having any dysphagia no cough hemoptysis or chest tightness. He is scheduled for a. PET scan the end of this month.  COMPLICATIONS OF TREATMENT: none  FOLLOW UP COMPLIANCE: keeps appointments   PHYSICAL EXAM:  BP 98/69   Pulse 78   Temp 97.6 F (36.4 C)  Well-developed well-nourished patient in NAD. HEENT reveals PERLA, EOMI, discs not visualized.  Oral cavity is clear. No oral mucosal lesions are identified. Neck is clear without evidence of cervical or supraclavicular adenopathy. Lungs are clear to A&P. Cardiac examination is essentially unremarkable with regular rate and rhythm without murmur rub or thrill. Abdomen is benign with no organomegaly or masses noted. Motor sensory and DTR levels are equal and symmetric in the upper and lower extremities. Cranial nerves II through XII are grossly intact. Proprioception is intact. No peripheral adenopathy or edema is identified. No motor or sensory levels are noted. Crude visual fields are within normal range.  RADIOLOGY RESULTS: PET scan will be reviewed when available  PLAN: Present time patient is improving he seems to tolerate his concurrent chemoradiation extremely well. I am please was overall progress. I've asked to see him back in 3 to 4 months for follow-up. I will evaluate his PET CT scan when it becomes available. Patient knows to call with any concerns.  I  would like to take this opportunity to thank you for allowing me to participate in the care of your patient.Armstead Peaks., MD

## 2016-10-27 NOTE — Telephone Encounter (Signed)
Faxed over order to number in message

## 2016-10-27 NOTE — Telephone Encounter (Signed)
Asking if you could send an order th [facility] for IVF instead of him having to drive here  (a [place], compared 10 mins to go there. She has already checked to see if this can be done and was told we just need to send an order to [facility].  Please advise or fax order

## 2016-10-28 ENCOUNTER — Inpatient Hospital Stay: Payer: Medicare Other

## 2016-10-29 ENCOUNTER — Other Ambulatory Visit: Payer: Self-pay | Admitting: *Deleted

## 2016-10-29 NOTE — Telephone Encounter (Signed)
How long do want him to stay on Decadron, He started it May 2,2018 for appetite

## 2016-10-29 NOTE — Addendum Note (Signed)
Addended by: Betti Cruz on: 10/29/2016 02:32 PM   Modules accepted: Orders

## 2016-10-30 ENCOUNTER — Telehealth: Payer: Self-pay | Admitting: *Deleted

## 2016-10-30 NOTE — Telephone Encounter (Signed)
Dr Grayland Ormond given message and number to return call

## 2016-10-30 NOTE — Telephone Encounter (Signed)
Called to report that patient is admitted to [facility] with pneumonia and would like to speak with Dr Grayland Ormond regarding his status. Please return her call [phone]

## 2016-10-31 ENCOUNTER — Telehealth: Payer: Self-pay | Admitting: *Deleted

## 2016-10-31 NOTE — Telephone Encounter (Signed)
I called and spoke with Dr Elie Confer who wanted prognosis and functional status of pt. I read notes to her and she stated that they are considering rehab for pt at discharge. If he is unable to make appts next week, they will call us and let us know. She states that the number she gave is a workroom and if no one is there it just rings if someone is on the line, it goes to automated system. She left another contact number for the nurses station [phone] and said that they can find md.

## 2016-10-31 NOTE — Telephone Encounter (Signed)
I attempted to return call to Dr. Elie Confer at [facility], the number provided is an automated line, there is not an option to leave a voicemail message on this line. I can access his records at [facility] in [place], Dr. Elie Confer should be able to see Dr. Gary Fleet most recent office notes as well.

## 2016-11-06 ENCOUNTER — Ambulatory Visit: Payer: Medicare Other

## 2016-11-07 ENCOUNTER — Other Ambulatory Visit: Payer: Medicare Other

## 2016-11-07 ENCOUNTER — Ambulatory Visit: Payer: Medicare Other

## 2016-11-07 ENCOUNTER — Ambulatory Visit: Payer: Medicare Other | Admitting: Oncology

## 2016-11-07 ENCOUNTER — Telehealth: Payer: Self-pay

## 2016-11-07 NOTE — Telephone Encounter (Signed)
-----   Message from Wallene Dales sent at 11/07/2016  8:30 AM EDT ----- Regarding: RE: canc appts? I spoke with [name] his daughter yesterday and she is going to call back once he is released from [facility].  Thank you  ----- Message ----- From: Lloyd Huger, MD Sent: 11/06/2016   9:33 PM To: Lenoard Aden, # Subject: RE: canc appts?                                Can we be sure he has f/u when I get back from [facility]?  Thanks!  ----- Message ----- From: Elouise Munroe Sent: 11/04/2016   4:09 PM To: Lloyd Huger, MD, Wallene Dales, # Subject: canc appts?                                    Hi Mr Gaunt daughter, [name] just called and he is in [facility] w/ pneumonia and shingles..not sure when he will be released.  She needs to be contacted to R/S-or CANCEL his PET this Thursday and appt w/ [place] Friday.  THX

## 2016-11-16 ENCOUNTER — Emergency Department: Payer: Medicare Other

## 2016-11-16 ENCOUNTER — Encounter: Payer: Self-pay | Admitting: Radiology

## 2016-11-16 ENCOUNTER — Inpatient Hospital Stay: Payer: Medicare Other

## 2016-11-16 ENCOUNTER — Inpatient Hospital Stay
Admission: EM | Admit: 2016-11-16 | Discharge: 2016-11-19 | DRG: 175 | Disposition: A | Discharge status: Skilled Nursing Facility | Payer: Medicare Other | Attending: Internal Medicine | Admitting: Internal Medicine

## 2016-11-16 DIAGNOSIS — I5031 Acute diastolic (congestive) heart failure: Secondary | ICD-10-CM | POA: Diagnosis present

## 2016-11-16 DIAGNOSIS — L8990 Pressure ulcer of unspecified site, unspecified stage: Secondary | ICD-10-CM | POA: Insufficient documentation

## 2016-11-16 DIAGNOSIS — R627 Adult failure to thrive: Secondary | ICD-10-CM | POA: Diagnosis present

## 2016-11-16 DIAGNOSIS — I340 Nonrheumatic mitral (valve) insufficiency: Secondary | ICD-10-CM | POA: Diagnosis present

## 2016-11-16 DIAGNOSIS — Z87442 Personal history of urinary calculi: Secondary | ICD-10-CM

## 2016-11-16 DIAGNOSIS — R600 Localized edema: Secondary | ICD-10-CM | POA: Diagnosis not present

## 2016-11-16 DIAGNOSIS — Z87891 Personal history of nicotine dependence: Secondary | ICD-10-CM | POA: Diagnosis not present

## 2016-11-16 DIAGNOSIS — Z9889 Other specified postprocedural states: Secondary | ICD-10-CM

## 2016-11-16 DIAGNOSIS — Z79899 Other long term (current) drug therapy: Secondary | ICD-10-CM | POA: Diagnosis not present

## 2016-11-16 DIAGNOSIS — Z806 Family history of leukemia: Secondary | ICD-10-CM | POA: Diagnosis not present

## 2016-11-16 DIAGNOSIS — I110 Hypertensive heart disease with heart failure: Secondary | ICD-10-CM | POA: Diagnosis present

## 2016-11-16 DIAGNOSIS — D649 Anemia, unspecified: Secondary | ICD-10-CM | POA: Diagnosis present

## 2016-11-16 DIAGNOSIS — I4891 Unspecified atrial fibrillation: Secondary | ICD-10-CM | POA: Diagnosis present

## 2016-11-16 DIAGNOSIS — K219 Gastro-esophageal reflux disease without esophagitis: Secondary | ICD-10-CM | POA: Diagnosis present

## 2016-11-16 DIAGNOSIS — I959 Hypotension, unspecified: Secondary | ICD-10-CM | POA: Diagnosis present

## 2016-11-16 DIAGNOSIS — C3412 Malignant neoplasm of upper lobe, left bronchus or lung: Secondary | ICD-10-CM | POA: Diagnosis present

## 2016-11-16 DIAGNOSIS — B029 Zoster without complications: Secondary | ICD-10-CM | POA: Diagnosis present

## 2016-11-16 DIAGNOSIS — I2699 Other pulmonary embolism without acute cor pulmonale: Principal | ICD-10-CM | POA: Diagnosis present

## 2016-11-16 DIAGNOSIS — I82402 Acute embolism and thrombosis of unspecified deep veins of left lower extremity: Secondary | ICD-10-CM | POA: Diagnosis present

## 2016-11-16 DIAGNOSIS — Z8249 Family history of ischemic heart disease and other diseases of the circulatory system: Secondary | ICD-10-CM | POA: Diagnosis not present

## 2016-11-16 DIAGNOSIS — R0602 Shortness of breath: Secondary | ICD-10-CM | POA: Diagnosis not present

## 2016-11-16 LAB — CBC
HEMATOCRIT: 33.2 % — AB (ref 40.0–52.0)
Hemoglobin: 11.2 g/dL — ABNORMAL LOW (ref 13.0–18.0)
MCH: 32.4 pg (ref 26.0–34.0)
MCHC: 33.8 g/dL (ref 32.0–36.0)
MCV: 95.8 fL (ref 80.0–100.0)
Platelets: 260 10*3/uL (ref 150–440)
RBC: 3.46 MIL/uL — ABNORMAL LOW (ref 4.40–5.90)
RDW: 16.8 % — AB (ref 11.5–14.5)
WBC: 5.5 10*3/uL (ref 3.8–10.6)

## 2016-11-16 LAB — BASIC METABOLIC PANEL
Anion gap: 9 (ref 5–15)
BUN: 18 mg/dL (ref 6–20)
CO2: 25 mmol/L (ref 22–32)
Calcium: 8.5 mg/dL — ABNORMAL LOW (ref 8.9–10.3)
Chloride: 104 mmol/L (ref 101–111)
Creatinine, Ser: 0.67 mg/dL (ref 0.61–1.24)
GLUCOSE: 98 mg/dL (ref 65–99)
Potassium: 3.6 mmol/L (ref 3.5–5.1)
Sodium: 138 mmol/L (ref 135–145)

## 2016-11-16 LAB — TSH: TSH: 0.153 u[IU]/mL — AB (ref 0.350–4.500)

## 2016-11-16 LAB — PROTIME-INR
INR: 1.33
PROTHROMBIN TIME: 16.6 s — AB (ref 11.4–15.2)

## 2016-11-16 LAB — TROPONIN I

## 2016-11-16 LAB — APTT: APTT: 34 s (ref 24–36)

## 2016-11-16 LAB — GLUCOSE, CAPILLARY: GLUCOSE-CAPILLARY: 184 mg/dL — AB (ref 65–99)

## 2016-11-16 LAB — LACTIC ACID, PLASMA: LACTIC ACID, VENOUS: 1.7 mmol/L (ref 0.5–1.9)

## 2016-11-16 MED ORDER — AMIODARONE HCL IN DEXTROSE 360-4.14 MG/200ML-% IV SOLN
30.0000 mg/h | INTRAVENOUS | Status: DC
  Administered 2016-11-17 (×2): 30 mg/h via INTRAVENOUS
  Filled 2016-11-16: qty 200

## 2016-11-16 MED ORDER — LIDOCAINE VISCOUS 2 % MT SOLN
20.0000 mL | OROMUCOSAL | Status: DC | PRN
  Filled 2016-11-16: qty 30

## 2016-11-16 MED ORDER — SODIUM CHLORIDE 0.9 % IV SOLN: 250.0000 mL | INTRAVENOUS | Status: DC | PRN

## 2016-11-16 MED ORDER — ONDANSETRON HCL 4 MG PO TABS: 4.0000 mg | ORAL_TABLET | Freq: Four times a day (QID) | ORAL | Status: DC | PRN

## 2016-11-16 MED ORDER — GABAPENTIN 100 MG PO CAPS
100.0000 mg | ORAL_CAPSULE | Freq: Three times a day (TID) | ORAL | Status: DC
  Administered 2016-11-16 – 2016-11-19 (×8): 100 mg via ORAL
  Filled 2016-11-16 (×8): qty 1

## 2016-11-16 MED ORDER — ONDANSETRON HCL 4 MG/2ML IJ SOLN: 4.0000 mg | Freq: Four times a day (QID) | INTRAMUSCULAR | Status: DC | PRN

## 2016-11-16 MED ORDER — SODIUM CHLORIDE 0.9% FLUSH
3.0000 mL | Freq: Two times a day (BID) | INTRAVENOUS | Status: DC
  Administered 2016-11-16 – 2016-11-19 (×6): 3 mL via INTRAVENOUS

## 2016-11-16 MED ORDER — PANTOPRAZOLE SODIUM 40 MG PO TBEC
40.0000 mg | DELAYED_RELEASE_TABLET | Freq: Every day | ORAL | Status: DC
  Administered 2016-11-17: 40 mg via ORAL
  Filled 2016-11-16: qty 1

## 2016-11-16 MED ORDER — LEVALBUTEROL HCL 1.25 MG/0.5ML IN NEBU
1.2500 mg | INHALATION_SOLUTION | Freq: Once | RESPIRATORY_TRACT | Status: AC
  Administered 2016-11-16: 1.25 mg via RESPIRATORY_TRACT
  Filled 2016-11-16: qty 0.5

## 2016-11-16 MED ORDER — HEPARIN (PORCINE) IN NACL 100-0.45 UNIT/ML-% IJ SOLN
1400.0000 [IU]/h | INTRAMUSCULAR | Status: DC
  Administered 2016-11-16: 1200 [IU]/h via INTRAVENOUS
  Filled 2016-11-16: qty 250

## 2016-11-16 MED ORDER — SENNOSIDES-DOCUSATE SODIUM 8.6-50 MG PO TABS: 1.0000 | ORAL_TABLET | Freq: Every evening | ORAL | Status: DC | PRN

## 2016-11-16 MED ORDER — FOLIC ACID 1 MG PO TABS
1.0000 mg | ORAL_TABLET | Freq: Every day | ORAL | Status: DC
  Administered 2016-11-17 – 2016-11-19 (×3): 1 mg via ORAL
  Filled 2016-11-16 (×3): qty 1

## 2016-11-16 MED ORDER — OXYCODONE-ACETAMINOPHEN 5-325 MG PO TABS
1.0000 | ORAL_TABLET | Freq: Four times a day (QID) | ORAL | Status: DC | PRN
  Administered 2016-11-16 – 2016-11-18 (×3): 1 via ORAL
  Filled 2016-11-16 (×3): qty 1

## 2016-11-16 MED ORDER — HEPARIN BOLUS VIA INFUSION
4500.0000 [IU] | Freq: Once | INTRAVENOUS | Status: AC
  Administered 2016-11-16: 4500 [IU] via INTRAVENOUS
  Filled 2016-11-16: qty 4500

## 2016-11-16 MED ORDER — SODIUM CHLORIDE 0.9% FLUSH: 3.0000 mL | INTRAVENOUS | Status: DC | PRN

## 2016-11-16 MED ORDER — METHOTREXATE 2.5 MG PO TABS: 20.0000 mg | ORAL_TABLET | ORAL | Status: DC

## 2016-11-16 MED ORDER — METHYLPREDNISOLONE SODIUM SUCC 125 MG IJ SOLR
125.0000 mg | INTRAMUSCULAR | Status: AC
  Administered 2016-11-16: 125 mg via INTRAVENOUS
  Filled 2016-11-16: qty 2

## 2016-11-16 MED ORDER — DOCUSATE SODIUM 100 MG PO CAPS
100.0000 mg | ORAL_CAPSULE | Freq: Two times a day (BID) | ORAL | Status: DC
  Administered 2016-11-17 – 2016-11-19 (×5): 100 mg via ORAL
  Filled 2016-11-16 (×5): qty 1

## 2016-11-16 MED ORDER — AMIODARONE HCL IN DEXTROSE 360-4.14 MG/200ML-% IV SOLN
60.0000 mg/h | INTRAVENOUS | Status: DC
  Administered 2016-11-16 – 2016-11-17 (×2): 60 mg/h via INTRAVENOUS
  Filled 2016-11-16 (×3): qty 200

## 2016-11-16 MED ORDER — ORAL CARE MOUTH RINSE
15.0000 mL | Freq: Two times a day (BID) | OROMUCOSAL | Status: DC
  Administered 2016-11-16 – 2016-11-18 (×4): 15 mL via OROMUCOSAL

## 2016-11-16 MED ORDER — DICLOFENAC SODIUM 1 % TD GEL
4.0000 g | Freq: Four times a day (QID) | TRANSDERMAL | Status: DC
  Administered 2016-11-16 – 2016-11-19 (×9): 4 g via TOPICAL
  Filled 2016-11-16: qty 100

## 2016-11-16 MED ORDER — ACETAMINOPHEN 325 MG PO TABS: 650.0000 mg | ORAL_TABLET | Freq: Four times a day (QID) | ORAL | Status: DC | PRN

## 2016-11-16 MED ORDER — MEGESTROL ACETATE 40 MG PO TABS
40.0000 mg | ORAL_TABLET | Freq: Every day | ORAL | Status: DC
  Administered 2016-11-17: 40 mg via ORAL
  Filled 2016-11-16: qty 1

## 2016-11-16 MED ORDER — IOPAMIDOL (ISOVUE-370) INJECTION 76%
75.0000 mL | Freq: Once | INTRAVENOUS | Status: AC | PRN
  Administered 2016-11-16: 75 mL via INTRAVENOUS

## 2016-11-16 MED ORDER — HYDROXYCHLOROQUINE SULFATE 200 MG PO TABS
200.0000 mg | ORAL_TABLET | Freq: Every day | ORAL | Status: DC
  Administered 2016-11-17 – 2016-11-19 (×3): 200 mg via ORAL
  Filled 2016-11-16 (×3): qty 1

## 2016-11-16 MED ORDER — HEPARIN SODIUM (PORCINE) 5000 UNIT/ML IJ SOLN: 4000.0000 [IU] | Freq: Once | INTRAMUSCULAR | Status: DC

## 2016-11-16 MED ORDER — FLUOXETINE HCL 20 MG PO CAPS
40.0000 mg | ORAL_CAPSULE | Freq: Every day | ORAL | Status: DC
  Administered 2016-11-17 – 2016-11-19 (×3): 40 mg via ORAL
  Filled 2016-11-16 (×3): qty 2

## 2016-11-16 MED ORDER — TAMSULOSIN HCL 0.4 MG PO CAPS
0.4000 mg | ORAL_CAPSULE | Freq: Every day | ORAL | Status: DC
  Administered 2016-11-16 – 2016-11-18 (×3): 0.4 mg via ORAL
  Filled 2016-11-16 (×3): qty 1

## 2016-11-16 MED ORDER — ACETAMINOPHEN 650 MG RE SUPP: 650.0000 mg | Freq: Four times a day (QID) | RECTAL | Status: DC | PRN

## 2016-11-16 MED ORDER — AMIODARONE LOAD VIA INFUSION
150.0000 mg | Freq: Once | INTRAVENOUS | Status: AC
  Administered 2016-11-16: 150 mg via INTRAVENOUS
  Filled 2016-11-16: qty 83.34

## 2016-11-16 MED ORDER — BISACODYL 5 MG PO TBEC
5.0000 mg | DELAYED_RELEASE_TABLET | Freq: Every day | ORAL | Status: DC | PRN
  Administered 2016-11-19: 5 mg via ORAL
  Filled 2016-11-16: qty 1

## 2016-11-16 NOTE — Progress Notes (Signed)
ANTICOAGULATION CONSULT NOTE - Initial Consult  Pharmacy Consult for heparin Indication: pulmonary embolus  No Known Allergies  Patient Measurements: Height: 6' (182.9 cm) Weight: 170 lb (77.1 kg) IBW/kg (Calculated) : 77.6 Heparin Dosing Weight: 77.1 kg  Vital Signs: Temp: 98.6 F (37 C) (08/05 1501) Temp Source: Oral (08/05 1501) BP: 101/72 (08/05 1800) Pulse Rate: 52 (08/05 1730)  Labs:  Recent Labs  11/16/16 1511  HGB 11.2*  HCT 33.2*  PLT 260  CREATININE 0.67  TROPONINI <0.03    Estimated Creatinine Clearance: 83 mL/min (by C-G formula based on SCr of 0.67 mg/dL).   Medical History: Past Medical History:  Diagnosis Date  . Cancer ([place])    lung  . Cataract   . Collagen vascular disease ([place])   . History of kidney stones   . Kidney stones 2014  . 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 heparin      Assessment: 62 yom with b/l PE on CT. PMH lung cancer, recent admission for PNA. Pharmacy consulted to dose heparin for PE. No PTA OAC noted.  Goal of Therapy:  Heparin level 0.3-0.7 units/ml Monitor platelets by anticoagulation protocol: Yes   Plan:  Give 4500 units bolus x 1 Start heparin infusion at 1200 units/hr Check anti-Xa level in 8 hours and daily while on heparin Continue to monitor H&H and platelets  Laural Benes, Pharm.D., BCPS Clinical Pharmacist 11/16/2016,7:23 PM

## 2016-11-16 NOTE — ED Notes (Signed)
Pt has 20 gauge IV to right forearm, 22 gauge IV to right hand and a 20 gauge IV to left AC; all flush without difficulty;

## 2016-11-16 NOTE — ED Provider Notes (Signed)
[facility] Emergency Department Provider Note   ____________________________________________   First MD Initiated Contact with Patient 11/16/16 1452     (approximate)  I have reviewed the triage vital signs and the nursing notes.   HISTORY  Chief Complaint Shortness of Breath    HPI Frank Cowan is a 76 y.o. male history of lung cancer undergoing active chemotherapy and radiation  Patient presents today, history primarily provided by his daughter due to some confusion.  Daughter reports that patient was hospitalized in [facility] about a week ago, and discharged and was treated with antibiotics for her pneumonia. In discharge to rehabilitation where he has been for a couple of days. They've noticed that his oxygen level has been low at rehabilitation, normally on 2 L when his oxygen level was 74%. They've had increased his oxygen by 2 L  They've not noticed him being in any pain. He did have shingles that was causing pain but is now healing.   Past Medical History:  Diagnosis Date  . Cancer ([place])    lung  . Cataract   . Collagen vascular disease ([place])   . History of kidney stones   . Kidney stones 2014  . 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 Bilateral pulmonary embolism ([place]) 11/16/2016  . Hypotension 11/16/2016  . Anemia 11/16/2016  . Fever   . Squamous cell lung cancer, left ([place])   . Palliative care by specialist   . Failure to thrive in adult 09/10/2016  . Goals of care, counseling/discussion 06/22/2016  . 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 CYSTOSCOPY W/ URETEROSCOPY W/ LITHOTRIPSY    . ENDOBRONCHIAL ULTRASOUND N/A 06/12/2016   Procedure: ENDOBRONCHIAL ULTRASOUND;  Surgeon: Laverle Hobby, MD;  Location: [facility] ORS;  Service: Pulmonary;  Laterality: N/A;  . PORTA CATH INSERTION  N/A 06/26/2016   Procedure: Glori Luis Cath Insertion;  Surgeon: Algernon Huxley, MD;  Location: [place] CV LAB;  Service: Cardiovascular;  Laterality: N/A;  . TONSILLECTOMY      Prior to Admission medications   Medication Sig Start Date End Date Taking? Authorizing Provider  acetaminophen (TYLENOL) 500 MG tablet Take 500 mg by mouth every 8 (eight) hours as needed for mild pain or headache.    Yes [provider]  diclofenac sodium (VOLTAREN) 1 % GEL Apply topically 4 (four) times daily.   Yes [provider]  FLUoxetine (PROZAC) 40 MG capsule Take 40 mg by mouth daily.    Yes [provider]  folic acid (FOLVITE) 1 MG tablet Take 1 mg by mouth daily.  09/13/13  Yes [provider]  gabapentin (NEURONTIN) 100 MG capsule Take 100 mg by mouth 3 (three) times daily.   Yes [provider]  hydroxychloroquine (PLAQUENIL) 200 MG tablet Take 200 mg by mouth daily.   Yes [provider]  moxifloxacin (AVELOX) 400 MG tablet Take 400 mg by mouth daily at 8 pm. Take one tablet my mouth for seven (7) days. Started on 11/15/16.   Yes [provider]  tamsulosin (FLOMAX) 0.4 MG CAPS capsule Take 0.4 mg by mouth at bedtime.    Yes [provider]  bisacodyl (DULCOLAX) 5 MG EC tablet Take 5 mg by mouth daily as needed for moderate constipation.    [provider]  esomeprazole (NEXIUM) 40 MG capsule Take  1 capsule (40 mg total) by mouth daily. Patient not taking: Reported on 10/10/2016 08/02/16 08/02/17  Earleen Newport, MD  levofloxacin (LEVAQUIN) 500 MG tablet Take 1 tablet (500 mg total) by mouth daily. Patient not taking: Reported on 09/25/2016 09/13/16   Henreitta Leber, MD  lidocaine (XYLOCAINE) 2 % solution Use as directed 20 mLs in the mouth or throat as needed for mouth pain. Patient not taking: Reported on 09/25/2016 08/02/16   Earleen Newport, MD  megestrol (MEGACE) 40 MG tablet Take 1 tablet (40 mg total) by mouth  daily. Patient not taking: Reported on 09/25/2016 06/30/16   Jacquelin Hawking, NP  methotrexate (RHEUMATREX) 2.5 MG tablet Take 20 mg by mouth every Wednesday. Wednesday    [provider]  naproxen sodium (ANAPROX) 220 MG tablet Take 220 mg by mouth 2 (two) times daily as needed. For pain    [provider]  oxyCODONE-acetaminophen (PERCOCET/ROXICET) 5-325 MG tablet Take 1-2 tablets by mouth every 6 (six) hours as needed for severe pain. 09/02/16   Lloyd Huger, MD    Allergies Patient has no known allergies.  Family History  Problem Relation Age of Onset  . Heart disease Mother   . Heart disease Father   . Cancer Sister 77       luekemia    Social History Social History  Substance Use Topics  . Smoking status: Former Smoker    Packs/day: 0.50    Years: 55.00    Quit date: 03/28/2016  . Smokeless tobacco: Former Systems developer  . Alcohol use No     Comment: Not lately    Review of Systems - EM caveat: Patient with intermittent confusion, unable to recall  Patient does tell me he feels slightly short of breath. Denies any chest pain. No headache. No abdominal pain.   ____________________________________________   PHYSICAL EXAM:  VITAL SIGNS: ED Triage Vitals  Enc Vitals Group     BP 11/16/16 1501 103/72     Pulse Rate 11/16/16 1501 (!) 135     Resp 11/16/16 1501 16     Temp 11/16/16 1501 98.6 F (37 C)     Temp Source 11/16/16 1501 Oral     SpO2 11/16/16 1501 93 %     Weight 11/16/16 1503 170 lb (77.1 kg)     Height 11/16/16 1503 6' (1.829 m)     Head Circumference --      Peak Flow --      Pain Score --      Pain Loc --      Pain Edu? --      Excl. in [place]? --     Constitutional: Alert and orientedTo self and family but not to year or reason for coming. He thought he was coming to a funeral. Well appearing and in no acute distress. Eyes: Conjunctivae are normal. Head: Atraumatic. Nose: No congestion/rhinnorhea. Mouth/Throat: Mucous membranes  are moist. Neck: No stridor.   Cardiovascular: Tachycardic rate, irregular rhythm. Grossly normal heart sounds.  Good peripheral circulation. Respiratory: Normal effort. He has however noted to saturate in the mid 80s on his normal 2 L, on 4 L nasal cannula saturation mid 90s. Lung sounds are coarse throughout without focal rales or wheezing noted. No use of accessory muscles. Gastrointestinal: Soft and nontender. No distention. Healing lesions in the right groin in a radicular dermatomal pattern. Probable healing shingles Musculoskeletal: No lower extremity tenderness nor edema. Neurologic:  No gross focal neurologic deficits are appreciated.  Symmetric smile. Mild weakness in all extremities, no focal abnormality. Skin:  Skin is warm, dry and intact. No rash noted. Psychiatric: Mood and affect are relatively flat and withdrawn  ____________________________________________   LABS (all labs ordered are listed, but only abnormal results are displayed)  Labs Reviewed  CBC - Abnormal; Notable for the following:       Result Value   RBC 3.46 (*)    Hemoglobin 11.2 (*)    HCT 33.2 (*)    RDW 16.8 (*)    All other components within normal limits  BASIC METABOLIC PANEL - Abnormal; Notable for the following:    Calcium 8.5 (*)    All other components within normal limits  CULTURE, BLOOD (ROUTINE X 2)  CULTURE, BLOOD (ROUTINE X 2)  LACTIC ACID, PLASMA  TROPONIN I  LACTIC ACID, PLASMA  URINALYSIS, COMPLETE (UACMP) WITH MICROSCOPIC  PROTIME-INR  APTT  HEPARIN LEVEL (UNFRACTIONATED)   ____________________________________________  EKG  Reviewed and interpreted by me at 1505 Heart rate 120 QRS 90 QTc 4:30 Minimal nonspecific T-wave abnormality noted, A. fib rapid ventricular rhythm ____________________________________________  RADIOLOGY  Dg Chest 2 View  Result Date: 11/16/2016 CLINICAL DATA:  Shortness of breath and patient with a history of lung cancer and hospital discharge for  pneumonia 09/13/2016. EXAM: CHEST  2 VIEW COMPARISON:  PA and lateral chest 11/12/2016, 11/11/2016 and 10/09/2016. CT chest 10/29/2016. FINDINGS: Streaky airspace disease is seen in the right lower lobe which is new 10/29/2016 CT scan but appears improved since the 2 most recent plain films. Left upper lobe mass lesion appears unchanged. 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 There small scratch the small left pleural effusion with basilar airspace disease, unchanged. IMPRESSION: Hazy right lower lobe airspace disease could be due to atelectasis or pneumonia and appears improved since the 2 most recent plain films of the chest. Left upper lobe mass lesion. Chronic left basilar opacity and small effusion are also again seen. Right perihilar radiation change, stable in appearance. Electronically Signed   By: Inge Rise M.D.   On: 11/16/2016 15:40   Ct Angio Chest Pe W Or Wo Contrast  Result Date: 11/16/2016 CLINICAL DATA:  Pt very poor historian, per family pt with increased SOB that worsened today. Family states recently d/c from hospital with pneumonia. Pt also has hx of lung cancer s/p chemo and radiation. EXAM: CT ANGIOGRAPHY CHEST WITH CONTRAST TECHNIQUE: Multidetector CT imaging of the chest was performed using the standard protocol during bolus administration of intravenous contrast. Multiplanar CT image reconstructions and MIPs were obtained to evaluate the vascular anatomy. CONTRAST:  78ml of Isovue 370 used. COMPARISON:  Chest x-ray 11/16/2016, chest CT 10/29/2016 FINDINGS: Cardiovascular: Pulmonary arteries are well opacified by contrast bolus. There is acute pulmonary embolus within the right lower lobe posterior basal segment artery. Acute embolus is also identified within the left lower lobe posterior basal segment branches. There is extensive coronary artery disease. The heart is mildly enlarged. Configuration of the heart is normal. No evidence for  right heart strain. Trace pericardial effusion. Dense atherosclerotic calcification of the mitral annulus. There is aortic atherosclerosis without aneurysm. Mediastinum/Nodes: The visualized portion of the thyroid gland has a normal appearance. No significant mediastinal adenopathy. Confluent soft tissue density in the hilar regions limits evaluation of adenopathy. Lungs/Pleura: Within the left lung apex, there is an oval mass, partially obscured by a further consolidation, estimated to measure 2.7 cm, previously 1.9 cm. Cavitary mass in the  left upper lobe now measures 4.1 x 3.3 cm, previously 4.8 x 4.6 cm. An adjacent solid nodule is 2.1 x 8 2.7 cm, previously 2.1 x 2.8 cm. Radiation changes again noted in the right upper and lower lobes. There is significant increase in bilateral pleural effusions. There is increased an ground-glass opacity in the right lower lobe and right middle lobe, suspicious for infarct, in the setting of acute embolus. Consolidation in the left upper and lower lobes persists. Upper Abdomen: Partially imaged left renal cyst. Musculoskeletal: Extensive spondylosis of the thoracic spine. No suspicious lytic or blastic lesions are identified. Review of the MIP images confirms the above findings. IMPRESSION: 1. Bilateral pulmonary emboli without evidence for right heart strain. 2. New airspace filling opacities within the right lower lobe and right middle lobe, suspicious for infarcts. 3. Increased bilateral pleural effusions. 4. Persistent significant lung opacities with mixed response to treatment. Cavitary lesion appears smaller while other lesions appear similar in size or increased in size. 5. Mild cardiomegaly and extensive coronary artery disease. 6.  Aortic Atherosclerosis (ICD10-I70.0). Critical Value/emergent results were called by telephone at the time of interpretation on 11/16/2016 at 7:12 pm to Dr. Delman Kitten , who verbally acknowledged these results. Electronically Signed   By:  Nolon Nations M.D.   On: 11/16/2016 19:12    ____________________________________________   PROCEDURES  Procedure(s) performed: None  Procedures  Critical Care performed: Yes, see critical care note(s)  CRITICAL CARE Performed by: Delman Kitten   Total critical care time: 40 minutes  Critical care time was exclusive of separately billable procedures and treating other patients.  Critical care was necessary to treat or prevent imminent or life-threatening deterioration.  Critical care was time spent personally by me on the following activities: development of treatment plan with patient and/or surrogate as well as nursing, discussions with consultants, evaluation of patient's response to treatment, examination of patient, obtaining history from patient or surrogate, ordering and performing treatments and interventions, ordering and review of laboratory studies, ordering and review of radiographic studies, pulse oximetry and re-evaluation of patient's condition.  Patient presents with tachycardia, hypoxia, discovered have bilateral pulmonary wasn't requiring anticoagulation and admission. Patient at risk for acute respiratory decline, high risk for acute respiratory emergency. ____________________________________________   INITIAL IMPRESSION / ASSESSMENT AND PLAN / ED COURSE  Pertinent labs & imaging results that were available during my care of the patient were reviewed by me and considered in my medical decision making (see chart for details).  Patient presents for evaluation of hypoxia. He appears to be relatively asymptomatic to it, but clearly has an increased oxygen deficit. Evidently was given Lasix today actually dose at rehabilitation. He's been on Avelox for several days for a possible pneumonia.  Seem to have some slight confusion earlier, family reports that he thought he was coming to a funeral. He seems to be more clear now, recognizes family and a hospital. Question  if this may been some confusion due to some hypoxia.  Report he had several falls a week ago, but was seen and admitted for this. He has not had any recent fall since going to rehabilitation.  Noted to be in A. fib, not on any rate controlling medicine. Jake Michaelis reports they're told this on the last couple of physician visits including while inpatient but have not followed up with a cardiologist yet.     ----------------------------------------- 7:26 PM on 11/16/2016 -----------------------------------------  Patient and family updated. Initiating heparin as advised by the hospitalist team, admitted  for further workup. Initiating treatment for bilateral pulmonary wasn't, deferred treatment for possible associated pneumonia to admitting service. ____________________________________________   FINAL CLINICAL IMPRESSION(S) / ED DIAGNOSES  Final diagnoses:  Other acute pulmonary embolism without acute cor pulmonale (HCC)  Atrial fibrillation with RVR (HCC)      NEW MEDICATIONS STARTED DURING THIS VISIT:  New Prescriptions   No medications on file     Note:  This document was prepared using Dragon voice recognition software and may include unintentional dictation errors.     Delman Kitten, MD 11/16/16 [phone]

## 2016-11-16 NOTE — ED Notes (Signed)
Attempted report x1, was put on hold and no one came to phone

## 2016-11-16 NOTE — ED Notes (Signed)
Admitting MD at bedside.

## 2016-11-16 NOTE — ED Triage Notes (Signed)
Pt presents from [facility]. Recently d/c from hospital with pneumonia. Pt also has hx of lung cancer s/p chemo. PET scan scheduled from Tuesday. Pt on 2L 02 chronically.  Quale MD at bedside.

## 2016-11-16 NOTE — ED Notes (Signed)
Blood draw for lab performed at this time; pt tolerated well; family has gone for food for them and for pt as allowed by provider; side rails up x 2 with callbell in reach;

## 2016-11-16 NOTE — H&P (Addendum)
[facility] [place] NAME: Frank Cowan    MR#:  [id number]  DATE OF BIRTH:  Jun 29, 1940  DATE OF ADMISSION:  11/16/2016  PRIMARY CARE PHYSICIAN: Claiborne Billings, MD   REQUESTING/REFERRING PHYSICIAN:   CHIEF COMPLAINT:   Chief Complaint  Patient presents with  . Shortness of Breath    HISTORY OF PRESENT ILLNESS: Frank Cowan  is a 76 y.o. 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 It is unclear how long patient has been having shortness of breath, however, he was admitted at [facility] at the beginning of June for shortness of breath , then to [facility] and was discharged on Avelox . Since the hospital today with shortness of breath, leg swelling, shoulder pains, pleuritic chest pain or melena. He admits of some yellow sputum production, however, states that it's chronic, he admits of some chills. Chest x-ray revealed improved right lower lobe pneumonia, CT angiogram of the chest revealed bilateral pulmonary embolism and hospitalist services were contacted for admission   PAST MEDICAL HISTORY:   Past Medical History:  Diagnosis Date  . Cancer ([place])    lung  . Cataract   . Collagen vascular disease ([place])   . History of kidney stones   . Kidney stones 2014  . Squamous cell lung cancer, right ([place]) 05/14/2016    PAST SURGICAL HISTORY: Past Surgical History:  Procedure Laterality Date  . CYSTOSCOPY W/ URETEROSCOPY W/ LITHOTRIPSY    . ENDOBRONCHIAL ULTRASOUND N/A 06/12/2016   Procedure: ENDOBRONCHIAL ULTRASOUND;  Surgeon: Laverle Hobby, MD;  Location: [facility] ORS;  Service: Pulmonary;  Laterality:  N/A;  . PORTA CATH INSERTION N/A 06/26/2016   Procedure: Glori Luis Cath Insertion;  Surgeon: Algernon Huxley, MD;  Location: [facility];  Service: Cardiovascular;  Laterality: N/A;  . TONSILLECTOMY      SOCIAL HISTORY:  Social History  Substance Use Topics  . Smoking status: Former Smoker    Packs/day: 0.50    Years: 55.00    Quit date: 03/28/2016  . Smokeless tobacco: Former Systems developer  . Alcohol use No     Comment: Not lately    FAMILY HISTORY:  Family History  Problem Relation Age of Onset  . Heart disease Mother   . Heart disease Father   . Cancer Sister 6       luekemia    DRUG ALLERGIES: No Known Allergies  Review of Systems  Unable to perform ROS: Mental acuity    MEDICATIONS AT HOME:  Prior to Admission medications   Medication Sig Start Date End Date Taking? Authorizing Provider  acetaminophen (TYLENOL) 500 MG tablet Take 500 mg by mouth every 8 (eight) hours as needed for mild pain or headache.    Yes [provider]  diclofenac sodium (VOLTAREN) 1 % GEL Apply topically 4 (four) times daily.   Yes [provider]  FLUoxetine (PROZAC) 40 MG capsule Take 40 mg by mouth daily.    Yes [provider]  folic acid (FOLVITE) 1 MG tablet Take 1 mg by mouth daily.  09/13/13  Yes [provider]  gabapentin (NEURONTIN) 100 MG capsule Take 100 mg by mouth 3 (three) times  daily.   Yes [provider]  hydroxychloroquine (PLAQUENIL) 200 MG tablet Take 200 mg by mouth daily.   Yes [provider]  moxifloxacin (AVELOX) 400 MG tablet Take 400 mg by mouth daily at 8 pm. Take one tablet my mouth for seven (7) days. Started on 11/15/16.   Yes [provider]  tamsulosin (FLOMAX) 0.4 MG CAPS capsule Take 0.4 mg by mouth at bedtime.    Yes [provider]  bisacodyl (DULCOLAX) 5 MG EC tablet Take 5 mg by mouth daily as needed for moderate constipation.    [provider]  esomeprazole (NEXIUM) 40 MG capsule  Take 1 capsule (40 mg total) by mouth daily. Patient not taking: Reported on 10/10/2016 08/02/16 08/02/17  Earleen Newport, MD  levofloxacin (LEVAQUIN) 500 MG tablet Take 1 tablet (500 mg total) by mouth daily. Patient not taking: Reported on 09/25/2016 09/13/16   Henreitta Leber, MD  lidocaine (XYLOCAINE) 2 % solution Use as directed 20 mLs in the mouth or throat as needed for mouth pain. Patient not taking: Reported on 09/25/2016 08/02/16   Earleen Newport, MD  megestrol (MEGACE) 40 MG tablet Take 1 tablet (40 mg total) by mouth daily. Patient not taking: Reported on 09/25/2016 06/30/16   Jacquelin Hawking, NP  methotrexate (RHEUMATREX) 2.5 MG tablet Take 20 mg by mouth every Wednesday. Wednesday    [provider]  naproxen sodium (ANAPROX) 220 MG tablet Take 220 mg by mouth 2 (two) times daily as needed. For pain    [provider]  oxyCODONE-acetaminophen (PERCOCET/ROXICET) 5-325 MG tablet Take 1-2 tablets by mouth every 6 (six) hours as needed for severe pain. 09/02/16   Lloyd Huger, MD      PHYSICAL EXAMINATION:   VITAL SIGNS: Blood pressure 101/72, pulse (!) 52, temperature 98.6 F (37 C), temperature source Oral, resp. rate (!) 22, height 6' (1.829 m), weight 77.1 kg (170 lb), SpO2 98 %.  GENERAL:  76 y.o.-year-old patient lying in the bed , moderate to severe respiratory  distress, tachypneic, uncomfortable, stressed out, angry .  EYES: Pupils equal, round, reactive to light and accommodation. No scleral icterus. Extraocular muscles intact.  HEENT: Head atraumatic, normocephalic. Oropharynx and nasopharynx clear.  NECK:  Supple, no jugular venous distention. No thyroid enlargement, no tenderness. Lungs some diminished breath sounds bilaterally,  . Scattered left upper lobe wheezing, rales,rhonc, but no crepitations.  Using accessory muscles of respiration, with speech, with exertion .  CARDIOVASCULAR, The rhythm was Irregularly irregular, tachycardic. No  murmurs, rubs, or gallops.  ABDOMEN: Soft, nontender, nondistended. Bowel sounds present. No organomegaly or mass.  EXTREMITIES:   trace bilateral lower extremity and pedal edema, , no cyanosis, or clubbing.  NEUROLOGIC: Cranial nerves II through XII are intact. Muscle strength 5/5 in all extremities. Sensation intact. Gait not checked.  PSYCHIATRIC: The patient is alert and oriented x 3. , hard of hearing, angry, upset, lashes out at examiner SKIN: No obvious rash, lesion, or ulcer.   LABORATORY PANEL:   CBC  Recent Labs Lab 11/16/16 1511  WBC 5.5  HGB 11.2*  HCT 33.2*  PLT 260  MCV 95.8  MCH 32.4  MCHC 33.8  RDW 16.8*   ------------------------------------------------------------------------------------------------------------------  Chemistries   Recent Labs Lab 11/16/16 1511  NA 138  K 3.6  CL 104  CO2 25  GLUCOSE 98  BUN 18  CREATININE 0.67  CALCIUM 8.5*   ------------------------------------------------------------------------------------------------------------------  Cardiac Enzymes  Recent Labs Lab 11/16/16 1511  TROPONINI <0.03   ------------------------------------------------------------------------------------------------------------------  RADIOLOGY: Dg Chest 2 View  Result Date: 11/16/2016 CLINICAL DATA:  Shortness of breath and patient with a history of lung cancer and hospital discharge for pneumonia 09/13/2016. EXAM: CHEST  2 VIEW COMPARISON:  PA and lateral chest 11/12/2016, 11/11/2016 and 10/09/2016. CT chest 10/29/2016. FINDINGS: Streaky airspace disease is seen in the right lower lobe which is new 10/29/2016 CT scan but appears improved since the 2 most recent plain films. Left upper lobe mass lesion appears unchanged. 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 There small scratch the small left pleural effusion with basilar airspace disease, unchanged. IMPRESSION: Hazy right lower lobe airspace disease  could be due to atelectasis or pneumonia and appears improved since the 2 most recent plain films of the chest. Left upper lobe mass lesion. Chronic left basilar opacity and small effusion are also again seen. Right perihilar radiation change, stable in appearance. Electronically Signed   By: Inge Rise M.D.   On: 11/16/2016 15:40   Ct Angio Chest Pe W Or Wo Contrast  Result Date: 11/16/2016 CLINICAL DATA:  Pt very poor historian, per family pt with increased SOB that worsened today. Family states recently d/c from hospital with pneumonia. Pt also has hx of lung cancer s/p chemo and radiation. EXAM: CT ANGIOGRAPHY CHEST WITH CONTRAST TECHNIQUE: Multidetector CT imaging of the chest was performed using the standard protocol during bolus administration of intravenous contrast. Multiplanar CT image reconstructions and MIPs were obtained to evaluate the vascular anatomy. CONTRAST:  53ml of Isovue 370 used. COMPARISON:  Chest x-ray 11/16/2016, chest CT 10/29/2016 FINDINGS: Cardiovascular: Pulmonary arteries are well opacified by contrast bolus. There is acute pulmonary embolus within the right lower lobe posterior basal segment artery. Acute embolus is also identified within the left lower lobe posterior basal segment branches. There is extensive coronary artery disease. The heart is mildly enlarged. Configuration of the heart is normal. No evidence for right heart strain. Trace pericardial effusion. Dense atherosclerotic calcification of the mitral annulus. There is aortic atherosclerosis without aneurysm. Mediastinum/Nodes: The visualized portion of the thyroid gland has a normal appearance. No significant mediastinal adenopathy. Confluent soft tissue density in the hilar regions limits evaluation of adenopathy. Lungs/Pleura: Within the left lung apex, there is an oval mass, partially obscured by a further consolidation, estimated to measure 2.7 cm, previously 1.9 cm. Cavitary mass in the left upper lobe now  measures 4.1 x 3.3 cm, previously 4.8 x 4.6 cm. An adjacent solid nodule is 2.1 x 8 2.7 cm, previously 2.1 x 2.8 cm. Radiation changes again noted in the right upper and lower lobes. There is significant increase in bilateral pleural effusions. There is increased an ground-glass opacity in the right lower lobe and right middle lobe, suspicious for infarct, in the setting of acute embolus. Consolidation in the left upper and lower lobes persists. Upper Abdomen: Partially imaged left renal cyst. Musculoskeletal: Extensive spondylosis of the thoracic spine. No suspicious lytic or blastic lesions are identified. Review of the MIP images confirms the above findings. IMPRESSION: 1. Bilateral pulmonary emboli without evidence for right heart strain. 2. New airspace filling opacities within the right lower lobe and right middle lobe, suspicious for infarcts. 3. Increased bilateral pleural effusions. 4. Persistent significant lung opacities with mixed response to treatment. Cavitary lesion appears smaller while other lesions appear similar in size or increased in size. 5. Mild cardiomegaly and extensive coronary artery disease. 6.  Aortic Atherosclerosis (ICD10-I70.0). Critical Value/emergent results  were called by telephone at the time of interpretation on 11/16/2016 at 7:12 pm to Dr. Delman Kitten , who verbally acknowledged these results. Electronically Signed   By: Nolon Nations M.D.   On: 11/16/2016 19:12    EKG: Orders placed or performed during the hospital encounter of 11/16/16  . ED EKG  . ED EKG  . EKG 12-Lead  . EKG 12-Lead  EKG in the emergency room revealed atrial fibrillation at rate of 122, right axis deviation, nonspecific ST-T changes  IMPRESSION AND PLAN:  Active Problems:   Bilateral pulmonary embolism (HCC)   Hypotension   Anemia   Atrial fibrillation with RVR ([place])   #1. Bilateral pulmonary embolism, admit patient to medical floor, initiate him on heparin intravenously, change to Eliquis  upon discharge. Get Doppler ultrasound of bilateral lower extremities to rule out DVT   #2. Hyptension, due to pulmonary embolism, follow closely, give her IV fluid boluses if needed #3. Atrial fibrillation with rapid ventricular response, initiate patient on amiodarone IV bolus and drip due to concerns of congestive heart failure, since patient CT of chest revealed bilateral pleural effusions, check TSH #4. Acute diastolic CHF, initiate patient on Lasix as hie bood pressure improves. Echocardiogram done in June 2018 revealed ejection fraction of 50-55%,, severe mitral regurgitation    All the records are reviewed and case discussed with ED provider. Management plans discussed with the patient, family and they are in agreement.  CODE STATUS: Code Status History    Date Active Date Inactive Code Status Order ID Comments User Context   09/10/2016 12:37 PM 09/13/2016  3:11 PM Full Code [id number]  Max Sane, MD Inpatient       TOTAL TIME TAKING CARE OF THIS PATIENT 25minutes.    Theodoro Grist M.D on 11/16/2016 at 7:46 PM  Between 7am to 6pm - Pager - [phone] After 6pm go to [email or web address] - password [email or web address] [facility]  [place]  Office  [phone]  CC: Primary care physician; Claiborne Billings, MD

## 2016-11-17 ENCOUNTER — Inpatient Hospital Stay: Payer: Medicare Other

## 2016-11-17 DIAGNOSIS — L8990 Pressure ulcer of unspecified site, unspecified stage: Secondary | ICD-10-CM | POA: Insufficient documentation

## 2016-11-17 LAB — BASIC METABOLIC PANEL
ANION GAP: 9 (ref 5–15)
BUN: 22 mg/dL — AB (ref 6–20)
CHLORIDE: 104 mmol/L (ref 101–111)
CO2: 25 mmol/L (ref 22–32)
Calcium: 8.4 mg/dL — ABNORMAL LOW (ref 8.9–10.3)
Creatinine, Ser: 0.8 mg/dL (ref 0.61–1.24)
GFR calc Af Amer: >60 mL/min (ref >60)
GFR calc non Af Amer: >60 mL/min (ref >60)
Glucose, Bld: 159 mg/dL — ABNORMAL HIGH (ref 65–99)
POTASSIUM: 3.9 mmol/L (ref 3.5–5.1)
Sodium: 138 mmol/L (ref 135–145)

## 2016-11-17 LAB — CBC
HEMATOCRIT: 33.6 % — AB (ref 40.0–52.0)
HEMOGLOBIN: 11.4 g/dL — AB (ref 13.0–18.0)
MCH: 33 pg (ref 26.0–34.0)
MCHC: 33.9 g/dL (ref 32.0–36.0)
MCV: 97.1 fL (ref 80.0–100.0)
Platelets: 230 10*3/uL (ref 150–440)
RBC: 3.45 MIL/uL — AB (ref 4.40–5.90)
RDW: 16.7 % — AB (ref 11.5–14.5)
WBC: 3.9 10*3/uL (ref 3.8–10.6)

## 2016-11-17 LAB — TROPONIN I
Troponin I: <0.03 ng/mL (ref <0.03)
Troponin I: <0.03 ng/mL (ref <0.03)

## 2016-11-17 LAB — GLUCOSE, CAPILLARY: Glucose-Capillary: 138 mg/dL — ABNORMAL HIGH (ref 65–99)

## 2016-11-17 LAB — HEPARIN LEVEL (UNFRACTIONATED): Heparin Unfractionated: 0.11 IU/mL — ABNORMAL LOW (ref 0.30–0.70)

## 2016-11-17 LAB — MRSA PCR SCREENING: MRSA by PCR: NEGATIVE

## 2016-11-17 MED ORDER — APIXABAN 5 MG PO TABS
10.0000 mg | ORAL_TABLET | Freq: Two times a day (BID) | ORAL | Status: DC
  Administered 2016-11-17 – 2016-11-19 (×5): 10 mg via ORAL
  Filled 2016-11-17 (×5): qty 2

## 2016-11-17 MED ORDER — HEPARIN BOLUS VIA INFUSION
2500.0000 [IU] | Freq: Once | INTRAVENOUS | Status: AC
Start: 2016-11-17 — End: 2016-11-17
  Administered 2016-11-17: 2500 [IU] via INTRAVENOUS
  Filled 2016-11-17: qty 2500

## 2016-11-17 MED ORDER — ALPRAZOLAM 0.25 MG PO TABS
0.2500 mg | ORAL_TABLET | Freq: Two times a day (BID) | ORAL | Status: DC | PRN
  Administered 2016-11-17 – 2016-11-18 (×2): 0.25 mg via ORAL
  Filled 2016-11-17 (×2): qty 1

## 2016-11-17 MED ORDER — APIXABAN 5 MG PO TABS: 5.0000 mg | ORAL_TABLET | Freq: Two times a day (BID) | ORAL | Status: DC

## 2016-11-17 MED ORDER — ACETAMINOPHEN 325 MG PO TABS
650.0000 mg | ORAL_TABLET | Freq: Four times a day (QID) | ORAL | Status: DC | PRN
  Administered 2016-11-19: 650 mg via ORAL
  Filled 2016-11-17: qty 2

## 2016-11-17 MED ORDER — CIPROFLOXACIN HCL 750 MG PO TABS
750.0000 mg | ORAL_TABLET | Freq: Two times a day (BID) | ORAL | Status: DC
  Administered 2016-11-17 – 2016-11-19 (×4): 750 mg via ORAL
  Filled 2016-11-17 (×5): qty 1

## 2016-11-17 MED ORDER — HALOPERIDOL LACTATE 5 MG/ML IJ SOLN: 1.0000 mg | Freq: Two times a day (BID) | INTRAMUSCULAR | Status: DC | PRN

## 2016-11-17 MED ORDER — ACETAMINOPHEN 650 MG RE SUPP: 650.0000 mg | Freq: Four times a day (QID) | RECTAL | Status: DC | PRN

## 2016-11-17 NOTE — NC FL2 (Signed)
[place] LEVEL OF CARE SCREENING TOOL     IDENTIFICATION  Patient Name: Frank Cowan Birthdate: June 16, 1940 Sex: male Admission Date (Current Location): 11/16/2016  [place] and [place] Number:  Engineering geologist and Address:  [facility], [address], [place], [place] [address]      Provider Number: [id number]  Attending Physician Name and Address:  Fritzi Mandes, MD  Relative Name and Phone Number:       Current Level of Care: Hospital Recommended Level of Care: [place] Prior Approval Number:    Date Approved/Denied:   PASRR Number:  [id number] )  Discharge Plan: SNF    Current Diagnoses: Patient Active Problem List   Diagnosis Date Noted  . Pressure injury of skin 11/17/2016  . Bilateral pulmonary embolism ([place]) 11/16/2016  . Hypotension 11/16/2016  . Anemia 11/16/2016  . Atrial fibrillation with RVR ([place]) 11/16/2016  . Fever   . Squamous cell lung cancer, left ([place])   . Palliative care by specialist   . Failure to thrive in adult 09/10/2016  . Goals of care, counseling/discussion 06/22/2016  . Squamous cell lung cancer, right ([place]) 05/14/2016    Orientation RESPIRATION BLADDER Height & Weight     Self  O2 (2 Liters Oxygen ) Continent Weight: 167 lb 8 oz (76 kg) Height:  6' (182.9 cm)  BEHAVIORAL SYMPTOMS/MOOD NEUROLOGICAL BOWEL NUTRITION STATUS   (none)  (none) Continent Diet (Diet: Heart Healthy )  AMBULATORY STATUS COMMUNICATION OF NEEDS Skin   Extensive Assist Verbally PU Stage and Appropriate Care (Pressure Ulcer Stage 1 on coccyx. )                       Personal Care Assistance Level of Assistance  Bathing, Feeding, Dressing Bathing Assistance: Limited assistance Feeding assistance: Independent Dressing Assistance: Limited assistance     Functional Limitations Info  Sight, Hearing, Speech Sight Info: Adequate Hearing Info: Adequate Speech Info: Adequate    SPECIAL CARE  FACTORS FREQUENCY  PT (By licensed PT), OT (By licensed OT)     PT Frequency:  (5) OT Frequency:  (5)            Contractures      Additional Factors Info  Code Status, Allergies Code Status Info:  (Full Code. ) Allergies Info:  (No Known Allergies. )           Current Medications (11/17/2016):  This is the current hospital active medication list Current Facility-Administered Medications  Medication Dose Route Frequency Provider Last Rate Last Dose  . 0.9 %  sodium chloride infusion  250 mL Intravenous PRN Theodoro Grist, MD      . acetaminophen (TYLENOL) tablet 650 mg  650 mg Oral Q6H PRN Theodoro Grist, MD       Or  . acetaminophen (TYLENOL) suppository 650 mg  650 mg Rectal Q6H PRN Theodoro Grist, MD      . ALPRAZolam Duanne Moron) tablet 0.25 mg  0.25 mg Oral BID PRN Fritzi Mandes, MD      . apixaban Arne Cleveland) tablet 10 mg  10 mg Oral BID Fritzi Mandes, MD   10 mg at 11/17/16 1049   Followed by  . [START ON 11/24/2016] apixaban (ELIQUIS) tablet 5 mg  5 mg Oral BID Fritzi Mandes, MD      . bisacodyl (DULCOLAX) EC tablet 5 mg  5 mg Oral Daily PRN Theodoro Grist, MD      . diclofenac sodium (VOLTAREN) 1 %  transdermal gel 4 g  4 g Topical QID Theodoro Grist, MD   4 g at 11/17/16 1034  . docusate sodium (COLACE) capsule 100 mg  100 mg Oral BID Theodoro Grist, MD   100 mg at 11/17/16 1034  . FLUoxetine (PROZAC) capsule 40 mg  40 mg Oral Daily Theodoro Grist, MD   40 mg at 11/17/16 1034  . folic acid (FOLVITE) tablet 1 mg  1 mg Oral Daily Theodoro Grist, MD   1 mg at 11/17/16 1034  . gabapentin (NEURONTIN) capsule 100 mg  100 mg Oral TID Theodoro Grist, MD   100 mg at 11/17/16 1034  . haloperidol lactate (HALDOL) injection 1 mg  1 mg Intravenous BID PRN Fritzi Mandes, MD      . hydroxychloroquine (PLAQUENIL) tablet 200 mg  200 mg Oral Daily Theodoro Grist, MD   200 mg at 11/17/16 1034  . lidocaine (XYLOCAINE) 2 % viscous mouth solution 20 mL  20 mL Mouth/Throat PRN Theodoro Grist, MD      .  MEDLINE mouth rinse  15 mL Mouth Rinse BID Theodoro Grist, MD   15 mL at 11/16/16 2311  . megestrol (MEGACE) tablet 40 mg  40 mg Oral Daily Theodoro Grist, MD   40 mg at 11/17/16 1034  . [START ON 11/19/2016] methotrexate (RHEUMATREX) tablet 20 mg  20 mg Oral Q Wed [place], Rima, MD      . ondansetron [facility]) tablet 4 mg  4 mg Oral Q6H PRN Theodoro Grist, MD       Or  . ondansetron (ZOFRAN) injection 4 mg  4 mg Intravenous Q6H PRN Theodoro Grist, MD      . oxyCODONE-acetaminophen (PERCOCET/ROXICET) 5-325 MG per tablet 1-2 tablet  1-2 tablet Oral Q6H PRN Theodoro Grist, MD   1 tablet at 11/16/16 2257  . pantoprazole (PROTONIX) EC tablet 40 mg  40 mg Oral Daily Theodoro Grist, MD   40 mg at 11/17/16 1034  . senna-docusate (Senokot-S) tablet 1 tablet  1 tablet Oral QHS PRN Theodoro Grist, MD      . sodium chloride flush (NS) 0.9 % injection 3 mL  3 mL Intravenous Q12H Theodoro Grist, MD   3 mL at 11/17/16 1035  . sodium chloride flush (NS) 0.9 % injection 3 mL  3 mL Intravenous PRN Theodoro Grist, MD      . tamsulosin (FLOMAX) capsule 0.4 mg  0.4 mg Oral QHS Theodoro Grist, MD   0.4 mg at 11/16/16 2257     Discharge Medications: Please see discharge summary for a list of discharge medications.  Relevant Imaging Results:  Relevant Lab Results:   Additional Information  (SSN: [id number])  Marielouise Amey, Veronia Beets, LCSW

## 2016-11-17 NOTE — Progress Notes (Signed)
ANTICOAGULATION CONSULT NOTE - Initial Consult  Pharmacy Consult for Apixaban Indication: pulmonary embolus  No Known Allergies  Patient Measurements: Height: 6' (182.9 cm) Weight: 167 lb 8 oz (76 kg) IBW/kg (Calculated) : 77.6   Vital Signs: Temp: 97.6 F (36.4 C) (08/06 0339) Temp Source: Oral (08/06 0339) BP: 106/69 (08/06 0339) Pulse Rate: 97 (08/06 0339)  Labs:  Recent Labs  11/16/16 1511 11/16/16 1949 11/16/16 2125 11/17/16 0320 11/17/16 0913  HGB 11.2*  --   --  11.4*  --   HCT 33.2*  --   --  33.6*  --   PLT 260  --   --  230  --   APTT  --  34  --   --   --   LABPROT  --  16.6*  --   --   --   INR  --  1.33  --   --   --   HEPARINUNFRC  --   --   --  0.11*  --   CREATININE 0.67  --   --  0.80  --   TROPONINI <0.03  --  <0.03 <0.03 <0.03    Estimated Creatinine Clearance: 81.8 mL/min (by C-G formula based on SCr of 0.8 mg/dL).   Medical History: Past Medical History:  Diagnosis Date  . Cancer ([place])    lung  . Cataract   . Collagen vascular disease ([place])   . History of kidney stones   . Kidney stones 2014  . Squamous cell lung cancer, right ([place]) 05/14/2016   Assessment: 76 yo male found to have bilateral PE transitioning from heparin drip to Eliquis. Discussed anticoagulant choice with rounding hospitalist; pt also followed by Dr. Grayland Ormond as outpt per MD.   Goal of Therapy:  Monitor platelets by anticoagulation protocol: Yes   Plan:  Stop heparin drip Apixaban 10 mg PO BID x7 days followed by 5 mg PO BID SCr and CBC at least every three days Apixaban to be started at time of heparin discontinuation - discussed with RN  Pharmacy will continue to follow.   Rayna Sexton L 11/17/2016,10:44 AM

## 2016-11-17 NOTE — Care Management Note (Addendum)
Case Management Note  Patient Details  Name: Frank Cowan MRN: [id number] Date of Birth: 01/09/1941  Subjective/Objective:                 Admitted with bilateral pulmonary embolus. Also ruled in for extensive DVT on the left. There is a consult for SW for placement.   Anticipate discharge on Eliquis. Will provide daughter with 30 day coupon for when patient discharges home.  Patient with stage 3 lung cancer and undergoing chemo therapy.  Finished one round  of therapy and will start a new round 11/20/2016. Current oxygen requirements acute. Palliative has followed in the past. Daughter who is at bedside states very firmly that patient is to remain a full code and wish for patient to return to [place] to continue rehab.  Patient has an outpatient PET that was scheduled for 11/18/2016.     Action/Plan: Requested physical therapy consult   Expected Discharge Date:                  Expected Discharge Plan:     In-House Referral:     Discharge planning Services     Post Acute Care Choice:    Choice offered to:     DME Arranged:    DME Agency:     HH Arranged:    HH Agency:     Status of Service:     If discussed at [employer] of Stay Meetings, dates discussed:    Additional Comments:  Katrina Stack, RN 11/17/2016, 9:40 AM

## 2016-11-17 NOTE — Progress Notes (Signed)
Assessment completed. Patient confused and agitated. Pt unaware that he is in the hospital and is acting paranoid. This Psychologist, counselling with daughter [name] and updated daughter with current POC.. Patient spoke with daughter. She will be in this morning to patient. Will notify MD of patient acute mental status change.

## 2016-11-17 NOTE — Progress Notes (Signed)
ANTICOAGULATION CONSULT NOTE - Initial Consult  Pharmacy Consult for heparin Indication: pulmonary embolus  No Known Allergies  Patient Measurements: Height: 6' (182.9 cm) Weight: 167 lb 8 oz (76 kg) IBW/kg (Calculated) : 77.6 Heparin Dosing Weight: 77.1 kg  Vital Signs: Temp: 97.6 F (36.4 C) (08/06 0339) Temp Source: Oral (08/06 0339) BP: 106/69 (08/06 0339) Pulse Rate: 97 (08/06 0339)  Labs:  Recent Labs  11/16/16 1511 11/16/16 1949 11/16/16 2125 11/17/16 0320  HGB 11.2*  --   --  11.4*  HCT 33.2*  --   --  33.6*  PLT 260  --   --  230  APTT  --  34  --   --   LABPROT  --  16.6*  --   --   INR  --  1.33  --   --   HEPARINUNFRC  --   --   --  0.11*  CREATININE 0.67  --   --  0.80  TROPONINI <0.03  --  <0.03 <0.03    Estimated Creatinine Clearance: 81.8 mL/min (by C-G formula based on SCr of 0.8 mg/dL).   Medical History: Past Medical History:  Diagnosis Date  . Cancer ([place])    lung  . Cataract   . Collagen vascular disease ([place])   . History of kidney stones   . Kidney stones 2014  . Squamous cell lung cancer, right ([place]) 05/14/2016    Medications:  Infusions:  . sodium chloride    . amiodarone 30 mg/hr (11/17/16 0300)  . heparin 1,200 Units/hr (11/17/16 0300)    Assessment: 28 yom with b/l PE on CT. PMH lung cancer, recent admission for PNA. Pharmacy consulted to dose heparin for PE. No PTA OAC noted.  Goal of Therapy:  Heparin level 0.3-0.7 units/ml Monitor platelets by anticoagulation protocol: Yes   Plan:  Give 4500 units bolus x 1 Start heparin infusion at 1200 units/hr Check anti-Xa level in 8 hours and daily while on heparin Continue to monitor H&H and platelets   8/6 @ 0320 HL 0.11 subtherapeutic. Will rebolus w/ heparin 2500 units IV x 1 and will increase rate to 1400 units/hr and will recheck @ 1130.  Tobie Lords, Pharm.D., BCPS Clinical Pharmacist 11/17/2016,4:41 AM

## 2016-11-17 NOTE — Progress Notes (Signed)
PT Cancellation Note  Patient Details Name: UNKNOWN SCHLEYER MRN: [id number] DOB: [birth year]   Cancelled Treatment:     Patient admitted for bilateral PE and LLT DVT, he was started on Heparin drip on 8/5 and per protocol has not yet been on for 48 hours. Given the above, PT will hold patient and re-attempt when patient is more medically appropriate for mobility evaluation.   Royce Macadamia PT, DPT, CSCS    11/17/2016, 2:11 PM

## 2016-11-17 NOTE — Consult Note (Signed)
[facility] Consultation Note  Patient ID: Frank Cowan, MRN: [id number], DOB/AGE: 1941-04-01 76 y.o. Admit date: 11/16/2016   Date of Consult: 11/17/2016 Primary Physician: Claiborne Billings, MD Primary Cardiologist: Nehemiah Massed  Chief Complaint:  Chief Complaint  Patient presents with  . Shortness of Breath   Reason for Consult: atrial fibrillation with rapid ventricular rate  HPI: 76 y.o. male with the known essential hypertension with the recent severe shortness of breath and pneumonia and diagnosed with stage III lung cancer. At that time the patient also had atrial fibrillation with variable heart rate which significantly improved at the time. The patient had recovered from his pneumonia and was sent to rehabilitation for which the patient was immobile after his immobility the patient ended up with a deep venous thrombosis of his left leg and bilateral pulmonary emboli. The patient is severely short of breath weak and fatigue and now has had the atrial fibrillation with rapid ventricular rate. Amiodarone was started for which the patient had better heart rate control of his atrial fibrillation. Amiodarone was used due to the fact that the patient had low blood pressure. Currently his heart rate stable his shortness of breath is slightly improved and he is been on anticoagulation. He has appropriate for elequis 5 mg twice per day for pulmonary embolism and atrial fibrillation. Previous echocardiogram has shown normal LV systolic function with no evidence of significant heart failure LV dysfunction or valvular heart disease.  Past Medical History:  Diagnosis Date  . Cancer ([place])    lung  . Cataract   . Collagen vascular disease ([place])   . History of kidney stones   . Kidney stones 2014  . 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 CYSTOSCOPY W/ URETEROSCOPY W/ LITHOTRIPSY    . ENDOBRONCHIAL  ULTRASOUND N/A 06/12/2016   Procedure: ENDOBRONCHIAL ULTRASOUND;  Surgeon: Laverle Hobby, MD;  Location: [facility] ORS;  Service: Pulmonary;  Laterality: N/A;  . PORTA CATH INSERTION N/A 06/26/2016   Procedure: Glori Luis Cath Insertion;  Surgeon: Algernon Huxley, MD;  Location: [place] [facility];  Service: Cardiovascular;  Laterality: N/A;  . TONSILLECTOMY       Home Meds: Prior to Admission medications   Medication Sig Start Date End Date Taking? Authorizing Provider  acetaminophen (TYLENOL) 500 MG tablet Take 500 mg by mouth every 8 (eight) hours as needed for mild pain or headache.    Yes [provider]  diclofenac sodium (VOLTAREN) 1 % GEL Apply topically 4 (four) times daily.   Yes [provider]  FLUoxetine (PROZAC) 40 MG capsule Take 40 mg by mouth daily.    Yes [provider]  folic acid (FOLVITE) 1 MG tablet Take 1 mg by mouth daily.  09/13/13  Yes [provider]  gabapentin (NEURONTIN) 100 MG capsule Take 100 mg by mouth 3 (three) times daily.   Yes [provider]  hydroxychloroquine (PLAQUENIL) 200 MG tablet Take 200 mg by mouth daily.   Yes [provider]  moxifloxacin (AVELOX) 400 MG tablet Take 400 mg by mouth daily at 8 pm. Take one tablet my mouth for seven (7) days. Started on 11/15/16.   Yes [provider]  tamsulosin (FLOMAX) 0.4 MG CAPS capsule Take 0.4 mg by mouth at bedtime.    Yes [provider]  bisacodyl (DULCOLAX) 5 MG EC tablet Take 5 mg by mouth daily as needed  for moderate constipation.    [provider]  esomeprazole (NEXIUM) 40 MG capsule Take 1 capsule (40 mg total) by mouth daily. Patient not taking: Reported on 10/10/2016 08/02/16 08/02/17  Earleen Newport, MD  levofloxacin (LEVAQUIN) 500 MG tablet Take 1 tablet (500 mg total) by mouth daily. Patient not taking: Reported on 09/25/2016 09/13/16   Henreitta Leber, MD  lidocaine (XYLOCAINE) 2 % solution Use as directed 20 mLs in the  mouth or throat as needed for mouth pain. Patient not taking: Reported on 09/25/2016 08/02/16   Earleen Newport, MD  megestrol (MEGACE) 40 MG tablet Take 1 tablet (40 mg total) by mouth daily. Patient not taking: Reported on 09/25/2016 06/30/16   Jacquelin Hawking, NP  methotrexate (RHEUMATREX) 2.5 MG tablet Take 20 mg by mouth every Wednesday. Wednesday    [provider]  naproxen sodium (ANAPROX) 220 MG tablet Take 220 mg by mouth 2 (two) times daily as needed. For pain    [provider]  oxyCODONE-acetaminophen (PERCOCET/ROXICET) 5-325 MG tablet Take 1-2 tablets by mouth every 6 (six) hours as needed for severe pain. 09/02/16   Lloyd Huger, MD    Inpatient Medications:  . apixaban  10 mg Oral BID   Followed by  . [START ON 11/24/2016] apixaban  5 mg Oral BID  . diclofenac sodium  4 g Topical QID  . docusate sodium  100 mg Oral BID  . FLUoxetine  40 mg Oral Daily  . folic acid  1 mg Oral Daily  . gabapentin  100 mg Oral TID  . hydroxychloroquine  200 mg Oral Daily  . mouth rinse  15 mL Mouth Rinse BID  . megestrol  40 mg Oral Daily  . [START ON 11/19/2016] methotrexate  20 mg Oral Q Wed  . pantoprazole  40 mg Oral Daily  . sodium chloride flush  3 mL Intravenous Q12H  . tamsulosin  0.4 mg Oral QHS   . sodium chloride      Allergies: No Known Allergies  Social History   Social History  . Marital status: Married    Spouse name: N/A  . Number of children: N/A  . Years of education: N/A   Occupational History  . Not on file.   Social History Main Topics  . Smoking status: Former Smoker    Packs/day: 0.50    Years: 55.00    Quit date: 03/28/2016  . Smokeless tobacco: Former Systems developer  . Alcohol use No     Comment: Not lately  . Drug use: No  . Sexual activity: Not on file   Other Topics Concern  . Not on file   Social History Narrative  . No narrative on file     Family History  Problem Relation Age of Onset  . Heart disease Mother   .  Heart disease Father   . Cancer Sister 6       luekemia     Review of Systems Positive for Shortness of breath cough congestion Negative for: General:  chills, fever, night sweats or weight changes.  Cardiovascular: PND orthopnea syncope dizziness  Dermatological skin lesions rashes Respiratory: Positive for Cough congestion Urologic: Frequent urination urination at night and hematuria Abdominal: negative for nausea, vomiting, diarrhea, bright red blood per rectum, melena, or hematemesis Neurologic: negative for visual changes, and/or hearing changes  All other systems reviewed and are otherwise negative except as noted above.  Labs:  Recent Labs  11/16/16 2125 11/17/16 0320 11/17/16  0913 11/17/16 1124  TROPONINI <0.03 <0.03 <0.03 <0.03   Lab Results  Component Value Date   WBC 3.9 11/17/2016   HGB 11.4 (L) 11/17/2016   HCT 33.6 (L) 11/17/2016   MCV 97.1 11/17/2016   PLT 230 11/17/2016    Recent Labs Lab 11/17/16 0320  NA 138  K 3.9  CL 104  CO2 25  BUN 22*  CREATININE 0.80  CALCIUM 8.4*  GLUCOSE 159*   No results found for: CHOL, HDL, LDLCALC, TRIG No results found for: DDIMER  Radiology/Studies:  Dg Chest 2 View  Result Date: 11/16/2016 CLINICAL DATA:  Shortness of breath and patient with a history of lung cancer and hospital discharge for pneumonia 09/13/2016. EXAM: CHEST  2 VIEW COMPARISON:  PA and lateral chest 11/12/2016, 11/11/2016 and 10/09/2016. CT chest 10/29/2016. FINDINGS: Streaky airspace disease is seen in the right lower lobe which is new 10/29/2016 CT scan but appears improved since the 2 most recent plain films. Left upper lobe mass lesion appears unchanged. 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 There small scratch the small left pleural effusion with basilar airspace disease, unchanged. IMPRESSION: Hazy right lower lobe airspace disease could be due to atelectasis or pneumonia and appears improved  since the 2 most recent plain films of the chest. Left upper lobe mass lesion. Chronic left basilar opacity and small effusion are also again seen. Right perihilar radiation change, stable in appearance. Electronically Signed   By: Inge Rise M.D.   On: 11/16/2016 15:40   Ct Angio Chest Pe W Or Wo Contrast  Result Date: 11/16/2016 CLINICAL DATA:  Pt very poor historian, per family pt with increased SOB that worsened today. Family states recently d/c from hospital with pneumonia. Pt also has hx of lung cancer s/p chemo and radiation. EXAM: CT ANGIOGRAPHY CHEST WITH CONTRAST TECHNIQUE: Multidetector CT imaging of the chest was performed using the standard protocol during bolus administration of intravenous contrast. Multiplanar CT image reconstructions and MIPs were obtained to evaluate the vascular anatomy. CONTRAST:  73ml of Isovue 370 used. COMPARISON:  Chest x-ray 11/16/2016, chest CT 10/29/2016 FINDINGS: Cardiovascular: Pulmonary arteries are well opacified by contrast bolus. There is acute pulmonary embolus within the right lower lobe posterior basal segment artery. Acute embolus is also identified within the left lower lobe posterior basal segment branches. There is extensive coronary artery disease. The heart is mildly enlarged. Configuration of the heart is normal. No evidence for right heart strain. Trace pericardial effusion. Dense atherosclerotic calcification of the mitral annulus. There is aortic atherosclerosis without aneurysm. Mediastinum/Nodes: The visualized portion of the thyroid gland has a normal appearance. No significant mediastinal adenopathy. Confluent soft tissue density in the hilar regions limits evaluation of adenopathy. Lungs/Pleura: Within the left lung apex, there is an oval mass, partially obscured by a further consolidation, estimated to measure 2.7 cm, previously 1.9 cm. Cavitary mass in the left upper lobe now measures 4.1 x 3.3 cm, previously 4.8 x 4.6 cm. An adjacent  solid nodule is 2.1 x 8 2.7 cm, previously 2.1 x 2.8 cm. Radiation changes again noted in the right upper and lower lobes. There is significant increase in bilateral pleural effusions. There is increased an ground-glass opacity in the right lower lobe and right middle lobe, suspicious for infarct, in the setting of acute embolus. Consolidation in the left upper and lower lobes persists. Upper Abdomen: Partially imaged left renal cyst. Musculoskeletal: Extensive spondylosis of the thoracic spine. No suspicious lytic or  blastic lesions are identified. Review of the MIP images confirms the above findings. IMPRESSION: 1. Bilateral pulmonary emboli without evidence for right heart strain. 2. New airspace filling opacities within the right lower lobe and right middle lobe, suspicious for infarcts. 3. Increased bilateral pleural effusions. 4. Persistent significant lung opacities with mixed response to treatment. Cavitary lesion appears smaller while other lesions appear similar in size or increased in size. 5. Mild cardiomegaly and extensive coronary artery disease. 6.  Aortic Atherosclerosis (ICD10-I70.0). Critical Value/emergent results were called by telephone at the time of interpretation on 11/16/2016 at 7:12 pm to Dr. Delman Kitten , who verbally acknowledged these results. Electronically Signed   By: Nolon Nations M.D.   On: 11/16/2016 19:12   US Venous Img Lower Bilateral  Result Date: 11/17/2016 CLINICAL DATA:  Bilateral pulmonary embolism. EXAM: BILATERAL LOWER EXTREMITY VENOUS DOPPLER ULTRASOUND TECHNIQUE: Gray-scale sonography with graded compression, as well as color Doppler and duplex ultrasound were performed to evaluate the lower extremity deep venous systems from the level of the common femoral vein and including the common femoral, femoral, profunda femoral, popliteal and calf veins including the posterior tibial, peroneal and gastrocnemius veins when visible. The superficial great saphenous vein was  also interrogated. Spectral Doppler was utilized to evaluate flow at rest and with distal augmentation maneuvers in the common femoral, femoral and popliteal veins. COMPARISON:  None. FINDINGS: RIGHT LOWER EXTREMITY Common Femoral Vein: No evidence of thrombus. Normal compressibility, respiratory phasicity and response to augmentation. Saphenofemoral Junction: No evidence of thrombus. Normal compressibility and flow on color Doppler imaging. Profunda Femoral Vein: No evidence of thrombus. Normal compressibility and flow on color Doppler imaging. Femoral Vein: No evidence of thrombus. Normal compressibility, respiratory phasicity and response to augmentation. Popliteal Vein: No evidence of thrombus. Normal compressibility, respiratory phasicity and response to augmentation. Calf Veins: No evidence of thrombus. Normal compressibility and flow on color Doppler imaging. Superficial Great Saphenous Vein: No evidence of thrombus. Normal compressibility and flow on color Doppler imaging. Venous Reflux:  None. Other Findings:  None. LEFT LOWER EXTREMITY Common Femoral Vein: Nonocclusive thrombus is identified. Saphenofemoral Junction: Nonocclusive thrombus is identified. Profunda Femoral Vein: Occlusive thrombus is present. Femoral Vein: Occlusive thrombus is present Popliteal Vein: Occlusive thrombus is present. Calf Veins: Occlusive thrombus is present. Superficial Great Saphenous Vein: Occlusive thrombus is present. Venous Reflux:  None. Other Findings:  None. IMPRESSION: Extensive deep venous thrombosis in the left leg as described above. Negative for right lower extremity DVT. Electronically Signed   By: Inge Rise M.D.   On: 11/17/2016 12:29    EKG: Atrial fibrillation with controlled ventricular rate and nonspecific ST changes  Weights: Filed Weights   11/16/16 1503 11/17/16 0429  Weight: 77.1 kg (170 lb) 76 kg (167 lb 8 oz)     Physical Exam: Blood pressure 117/72, pulse (!) 109, temperature 98 F  (36.7 C), temperature source Oral, resp. rate 18, height 6' (1.829 m), weight 76 kg (167 lb 8 oz), SpO2 95 %. Body mass index is 22.72 kg/m. General: Well developed, well nourished, in no acute distress. Head eyes ears nose throat: Normocephalic, atraumatic, sclera non-icteric, no xanthomas, nares are without discharge. No apparent thyromegaly and/or mass  Lungs: Normal respiratory effort.  Diffuse wheezes, no rales some rhonchi.  Heart: Irregular with normal S1 S2. no murmur gallop, no rub, PMI is normal size and placement, carotid upstroke normal without bruit, jugular venous pressure is normal Abdomen: Soft, non-tender, non-distended with normoactive bowel sounds. No hepatomegaly. No  rebound/guarding. No obvious abdominal masses. Abdominal aorta is normal size without bruit Extremities: Trace edema. no cyanosis, no clubbing, no ulcers  Peripheral : 2+ bilateral upper extremity pulses, 2+ bilateral femoral pulses, 2+ bilateral dorsal pedal pulse Neuro: Alert and oriented. No facial asymmetry. No focal deficit. Moves all extremities spontaneously. Musculoskeletal: Normal muscle tone without kyphosis Psych:  Responds to questions appropriately with a normal affect.    Assessment: 76 year old male with the stage III lung cancer essential hypertension now with pulmonary embolism atrial fibrillation with rapid ventricular rate slightly improved with appropriate medication management without evidence of myocardial infarction or heart failure  Plan: 1. Continue heart rate management with amiodarone orally at 200 mg each day if necessary but if well controlled will adjust medication management at home and or in the office 2. Anticoagulation for further risk reduction of pulmonary embolism and risk of stroke with atrial fibrillation 3. No further cardiac diagnostics necessary at this time 4. Begin ambulation and follow for improvements of symptoms and possible discharge back to health care facility  with follow-up with cardiology in one to 2 weeks for further adjustments of medication management  Signed, Corey Skains M.D. [facility] 11/17/2016, 1:34 PM

## 2016-11-17 NOTE — Clinical Social Work Note (Signed)
CSW consulted for "New SNF", however CSW was informed that pt was admitted from [facility]. CSW will follow up with facility. Full Assessment to follow. PT/OT eval pending. CSW will continue to follow.   Darden Dates, MSW, LCSW  Clinical Social Worker  [phone]

## 2016-11-17 NOTE — Progress Notes (Signed)
Order for telemetry monitoring expired and given ok to discontinue by MD [place]. CCMD notified of telemetry being discontinued. Earleen Reaper, RN

## 2016-11-17 NOTE — Progress Notes (Signed)
OT Cancellation Note  Patient Details Name: Frank Cowan MRN: [id number] DOB: 09/10/1940   Cancelled Treatment:    Reason Eval/Treat Not Completed: Medical issues which prohibited therapy. Order received, chart reviewed. Pt with bilateral PE and LLE DVT. Heparin drip started 8/5 at 8:17pm. Per therapy protocol, pt contraindicated at this time. Will hold 48 hours and plan to re-attempt OT evaluation after 8/7.   Jeni Salles, MPH, MS, OTR/L ascom [phone] 11/17/16, 2:02 PM

## 2016-11-17 NOTE — Progress Notes (Signed)
Spoke with Dr. Posey Pronto regarding patient confusing and agitation. Orders given, will continue to monitor.

## 2016-11-17 NOTE — Progress Notes (Addendum)
[facility] NAME: Frank Cowan    MR#:  [id number]  DATE OF BIRTH:  11/14/1940  SUBJECTIVE:   Came in with leg pain and increasing sob REVIEW OF SYSTEMS:   Review of Systems  Constitutional: Negative for chills, fever and weight loss.  HENT: Negative for ear discharge, ear pain and nosebleeds.   Eyes: Negative for blurred vision, pain and discharge.  Respiratory: Positive for shortness of breath. Negative for sputum production, wheezing and stridor.   Cardiovascular: Positive for claudication. Negative for chest pain, palpitations, orthopnea and PND.  Gastrointestinal: Negative for abdominal pain, diarrhea, nausea and vomiting.  Genitourinary: Negative for frequency and urgency.  Musculoskeletal: Positive for back pain. Negative for joint pain.  Neurological: Positive for weakness. Negative for sensory change, speech change and focal weakness.  Psychiatric/Behavioral: Negative for depression and hallucinations. The patient is not nervous/anxious.    Tolerating Diet:yes Tolerating PT: pending  DRUG ALLERGIES:  No Known Allergies  VITALS:  Blood pressure 117/72, pulse (!) 109, temperature 98 F (36.7 C), temperature source Oral, resp. rate 18, height 6' (1.829 m), weight 76 kg (167 lb 8 oz), SpO2 95 %.  PHYSICAL EXAMINATION:   Physical Exam  GENERAL:  76 y.o.-year-old patient lying in the bed with no acute distress.  EYES: Pupils equal, round, reactive to light and accommodation. No scleral icterus. Extraocular muscles intact.  HEENT: Head atraumatic, normocephalic. Oropharynx and nasopharynx clear.  NECK:  Supple, no jugular venous distention. No thyroid enlargement, no tenderness.  LUNGS: decreased breath sounds bilaterally, no wheezing, rales, rhonchi. No use of accessory muscles of respiration.  CARDIOVASCULAR: S1, S2 normal. No murmurs, rubs, or gallops.  ABDOMEN: Soft, nontender, nondistended. Bowel sounds  present. No organomegaly or mass.  EXTREMITIES: No cyanosis, clubbing or edema b/l.    NEUROLOGIC: Cranial nerves II through XII are intact. No focal Motor or sensory deficits b/l.   PSYCHIATRIC:  patient is alert and oriented x 3.  SKIN: No obvious rash, lesion, or ulcer.   LABORATORY PANEL:  CBC  Recent Labs Lab 11/17/16 0320  WBC 3.9  HGB 11.4*  HCT 33.6*  PLT 230    Chemistries   Recent Labs Lab 11/17/16 0320  NA 138  K 3.9  CL 104  CO2 25  GLUCOSE 159*  BUN 22*  CREATININE 0.80  CALCIUM 8.4*   Cardiac Enzymes  Recent Labs Lab 11/17/16 1124  TROPONINI <0.03   RADIOLOGY:  Dg Chest 2 View  Result Date: 11/16/2016 CLINICAL DATA:  Shortness of breath and patient with a history of lung cancer and hospital discharge for pneumonia 09/13/2016. EXAM: CHEST  2 VIEW COMPARISON:  PA and lateral chest 11/12/2016, 11/11/2016 and 10/09/2016. CT chest 10/29/2016. FINDINGS: Streaky airspace disease is seen in the right lower lobe which is new 10/29/2016 CT scan but appears improved since the 2 most recent plain films. Left upper lobe mass lesion appears unchanged. 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 There small scratch the small left pleural effusion with basilar airspace disease, unchanged. IMPRESSION: Hazy right lower lobe airspace disease could be due to atelectasis or pneumonia and appears improved since the 2 most recent plain films of the chest. Left upper lobe mass lesion. Chronic left basilar opacity and small effusion are also again seen. Right perihilar radiation change, stable in appearance. Electronically Signed   By: Inge Rise M.D.   On: 11/16/2016 15:40   Ct Angio Chest  Pe W Or Wo Contrast  Result Date: 11/16/2016 CLINICAL DATA:  Pt very poor historian, per family pt with increased SOB that worsened today. Family states recently d/c from hospital with pneumonia. Pt also has hx of lung cancer s/p chemo and radiation.  EXAM: CT ANGIOGRAPHY CHEST WITH CONTRAST TECHNIQUE: Multidetector CT imaging of the chest was performed using the standard protocol during bolus administration of intravenous contrast. Multiplanar CT image reconstructions and MIPs were obtained to evaluate the vascular anatomy. CONTRAST:  35ml of Isovue 370 used. COMPARISON:  Chest x-ray 11/16/2016, chest CT 10/29/2016 FINDINGS: Cardiovascular: Pulmonary arteries are well opacified by contrast bolus. There is acute pulmonary embolus within the right lower lobe posterior basal segment artery. Acute embolus is also identified within the left lower lobe posterior basal segment branches. There is extensive coronary artery disease. The heart is mildly enlarged. Configuration of the heart is normal. No evidence for right heart strain. Trace pericardial effusion. Dense atherosclerotic calcification of the mitral annulus. There is aortic atherosclerosis without aneurysm. Mediastinum/Nodes: The visualized portion of the thyroid gland has a normal appearance. No significant mediastinal adenopathy. Confluent soft tissue density in the hilar regions limits evaluation of adenopathy. Lungs/Pleura: Within the left lung apex, there is an oval mass, partially obscured by a further consolidation, estimated to measure 2.7 cm, previously 1.9 cm. Cavitary mass in the left upper lobe now measures 4.1 x 3.3 cm, previously 4.8 x 4.6 cm. An adjacent solid nodule is 2.1 x 8 2.7 cm, previously 2.1 x 2.8 cm. Radiation changes again noted in the right upper and lower lobes. There is significant increase in bilateral pleural effusions. There is increased an ground-glass opacity in the right lower lobe and right middle lobe, suspicious for infarct, in the setting of acute embolus. Consolidation in the left upper and lower lobes persists. Upper Abdomen: Partially imaged left renal cyst. Musculoskeletal: Extensive spondylosis of the thoracic spine. No suspicious lytic or blastic lesions are  identified. Review of the MIP images confirms the above findings. IMPRESSION: 1. Bilateral pulmonary emboli without evidence for right heart strain. 2. New airspace filling opacities within the right lower lobe and right middle lobe, suspicious for infarcts. 3. Increased bilateral pleural effusions. 4. Persistent significant lung opacities with mixed response to treatment. Cavitary lesion appears smaller while other lesions appear similar in size or increased in size. 5. Mild cardiomegaly and extensive coronary artery disease. 6.  Aortic Atherosclerosis (ICD10-I70.0). Critical Value/emergent results were called by telephone at the time of interpretation on 11/16/2016 at 7:12 pm to Dr. Delman Kitten , who verbally acknowledged these results. Electronically Signed   By: Nolon Nations M.D.   On: 11/16/2016 19:12   US Venous Img Lower Bilateral  Result Date: 11/17/2016 CLINICAL DATA:  Bilateral pulmonary embolism. EXAM: BILATERAL LOWER EXTREMITY VENOUS DOPPLER ULTRASOUND TECHNIQUE: Gray-scale sonography with graded compression, as well as color Doppler and duplex ultrasound were performed to evaluate the lower extremity deep venous systems from the level of the common femoral vein and including the common femoral, femoral, profunda femoral, popliteal and calf veins including the posterior tibial, peroneal and gastrocnemius veins when visible. The superficial great saphenous vein was also interrogated. Spectral Doppler was utilized to evaluate flow at rest and with distal augmentation maneuvers in the common femoral, femoral and popliteal veins. COMPARISON:  None. FINDINGS: RIGHT LOWER EXTREMITY Common Femoral Vein: No evidence of thrombus. Normal compressibility, respiratory phasicity and response to augmentation. Saphenofemoral Junction: No evidence of thrombus. Normal compressibility and flow on color Doppler  imaging. Profunda Femoral Vein: No evidence of thrombus. Normal compressibility and flow on color Doppler  imaging. Femoral Vein: No evidence of thrombus. Normal compressibility, respiratory phasicity and response to augmentation. Popliteal Vein: No evidence of thrombus. Normal compressibility, respiratory phasicity and response to augmentation. Calf Veins: No evidence of thrombus. Normal compressibility and flow on color Doppler imaging. Superficial Great Saphenous Vein: No evidence of thrombus. Normal compressibility and flow on color Doppler imaging. Venous Reflux:  None. Other Findings:  None. LEFT LOWER EXTREMITY Common Femoral Vein: Nonocclusive thrombus is identified. Saphenofemoral Junction: Nonocclusive thrombus is identified. Profunda Femoral Vein: Occlusive thrombus is present. Femoral Vein: Occlusive thrombus is present Popliteal Vein: Occlusive thrombus is present. Calf Veins: Occlusive thrombus is present. Superficial Great Saphenous Vein: Occlusive thrombus is present. Venous Reflux:  None. Other Findings:  None. IMPRESSION: Extensive deep venous thrombosis in the left leg as described above. Negative for right lower extremity DVT. Electronically Signed   By: Inge Rise M.D.   On: 11/17/2016 12:29   ASSESSMENT AND PLAN:  Bacilio Abascal  is a 76 y.o. 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  #1. Bilateral pulmonary embolism and left LE extensive DVT -pt was on heparin intravenously, change to Eliquis today -Vascular sx to see pt if he is candidate for thrombolysis   #2. Hypotension, due to pulmonary embolism -stable  #3. Atrial fibrillation with rapid ventricular response -resolved HR in the 80's -cont oral home dose Amiodarone  #4. Acute diastolic CHF, initiate patient on  Lasix as hie bood pressure improves. Echocardiogram done in June 2018 revealed ejection fraction of 50-55%,, severe mitral regurgitation  -pt has bilateral increasing pleural effusions due to CHF vs lung cancer -assess for home oxygen use  #5 Gen weakness PT to see pt CSW for d/c planning. Pt is from Rehab and will return there at d/c  #6 Positive sputum cx recieved call from [name] by PA with positive sputum cx with pseudomonas and kleibseilla. Appears this could be colonization since pt has cavitary lung mass lesion--chronic -given overall presentation will treat empirically. WBC normal   Case discussed with Care Management/Social Worker. Management plans discussed with the patient, family and they are in agreement.  CODE STATUS: FULL  DVT Prophylaxis: eliquis  TOTAL TIME TAKING CARE OF THIS PATIENT: 30 minutes.  >50% time spent on counselling and coordination of care pt and dter  POSSIBLE D/C IN 1-2 DAYS, DEPENDING ON CLINICAL CONDITION.  Note: This dictation was prepared with Dragon dictation along with smaller phrase technology. Any transcriptional errors that result from this process are unintentional.  Frank Cowan M.D on 11/17/2016 at 5:10 PM  Between 7am to 6pm - Pager - [phone]  After 6pm go to [email or web address] - password [email or web address] [email or web address]  Office  [phone]  CC: Primary care physician; Claiborne Billings, MD

## 2016-11-18 ENCOUNTER — Ambulatory Visit: Payer: Medicare Other

## 2016-11-18 DIAGNOSIS — I4891 Unspecified atrial fibrillation: Secondary | ICD-10-CM

## 2016-11-18 DIAGNOSIS — R600 Localized edema: Secondary | ICD-10-CM

## 2016-11-18 DIAGNOSIS — I2699 Other pulmonary embolism without acute cor pulmonale: Secondary | ICD-10-CM

## 2016-11-18 DIAGNOSIS — R0602 Shortness of breath: Secondary | ICD-10-CM

## 2016-11-18 LAB — GLUCOSE, CAPILLARY
Glucose-Capillary: 87 mg/dL (ref 65–99)
Glucose-Capillary: 161 mg/dL — ABNORMAL HIGH (ref 65–99)
Glucose-Capillary: 108 mg/dL — ABNORMAL HIGH (ref 65–99)

## 2016-11-18 MED ORDER — OXYCODONE-ACETAMINOPHEN 5-325 MG PO TABS
1.0000 | ORAL_TABLET | Freq: Four times a day (QID) | ORAL | Status: DC | PRN
Start: 2016-11-18 — End: 2016-11-19
  Administered 2016-11-18: 1 via ORAL
  Administered 2016-11-18 – 2016-11-19 (×3): 2 via ORAL
  Filled 2016-11-18 (×4): qty 2

## 2016-11-18 NOTE — Progress Notes (Signed)
Patient presently lying in the bed, appear to be short of breath, sat 88% on 02 , 2L bump it up 3 L with sat 92 %, family at bedside , PRN pain med administer as per request.

## 2016-11-18 NOTE — Consult Note (Signed)
[facility] Vascular Consult Note  MRN : [id number]  Frank Cowan is a 76 y.o. (Jul 03, 1940) male who presents with chief complaint of  Chief Complaint  Patient presents with  . Shortness of Breath  .  History of Present Illness: I am asked to evaluate the patient by Dr. Posey Pronto. The patient is a 76 year old gentleman admitted to [facility] on 11/16/2016 with the acute worsening of his shortness of breath. He also was noted to have shoulder pain bilaterally as well as pleuritic chest pains with inspiration. Associated with this was the acute onset of right lower extremity swelling associated with a severe pain.  He has known stage III squamous cell lung cancer and is currently undergoing treatment including radiation.  He has been admitted and started on anticoagulation. Today at the time my interview I asked if he had been up and walking and he stated he walked twice. He noted that his right leg hurts with ambulation but then said and sewed is my left. He's been maintained on 2 L oxygen with sats in the mid 90s.   Current Facility-Administered Medications  Medication Dose Route Frequency Provider Last Rate Last Dose  . 0.9 %  sodium chloride infusion  250 mL Intravenous PRN Theodoro Grist, MD   Stopped at 11/17/16 1045  . acetaminophen (TYLENOL) tablet 650 mg  650 mg Oral Q6H PRN Fritzi Mandes, MD       Or  . acetaminophen (TYLENOL) suppository 650 mg  650 mg Rectal Q6H PRN Fritzi Mandes, MD      . ALPRAZolam Duanne Moron) tablet 0.25 mg  0.25 mg Oral BID PRN Fritzi Mandes, MD   0.25 mg at 11/17/16 2102  . apixaban (ELIQUIS) tablet 10 mg  10 mg Oral BID Fritzi Mandes, MD   10 mg at 11/17/16 2101   Followed by  . [START ON 11/24/2016] apixaban (ELIQUIS) tablet 5 mg  5 mg Oral BID Fritzi Mandes, MD      . bisacodyl (DULCOLAX) EC tablet 5 mg  5 mg Oral Daily PRN Theodoro Grist, MD      . ciprofloxacin (CIPRO) tablet 750 mg  750 mg Oral BID Fritzi Mandes, MD   750  mg at 11/18/16 0824  . diclofenac sodium (VOLTAREN) 1 % transdermal gel 4 g  4 g Topical QID Theodoro Grist, MD   4 g at 11/17/16 2102  . docusate sodium (COLACE) capsule 100 mg  100 mg Oral BID Theodoro Grist, MD   100 mg at 11/17/16 2102  . FLUoxetine (PROZAC) capsule 40 mg  40 mg Oral Daily Theodoro Grist, MD   40 mg at 11/17/16 1034  . folic acid (FOLVITE) tablet 1 mg  1 mg Oral Daily Theodoro Grist, MD   1 mg at 11/17/16 1034  . gabapentin (NEURONTIN) capsule 100 mg  100 mg Oral TID Theodoro Grist, MD   100 mg at 11/17/16 2101  . hydroxychloroquine (PLAQUENIL) tablet 200 mg  200 mg Oral Daily Theodoro Grist, MD   200 mg at 11/17/16 1034  . lidocaine (XYLOCAINE) 2 % viscous mouth solution 20 mL  20 mL Mouth/Throat PRN Theodoro Grist, MD      . MEDLINE mouth rinse  15 mL Mouth Rinse BID Theodoro Grist, MD   15 mL at 11/17/16 2102  . ondansetron (ZOFRAN) tablet 4 mg  4 mg Oral Q6H PRN Theodoro Grist, MD       Or  . ondansetron (ZOFRAN) injection 4 mg  4  mg Intravenous Q6H PRN Theodoro Grist, MD      . oxyCODONE-acetaminophen (PERCOCET/ROXICET) 5-325 MG per tablet 1-2 tablet  1-2 tablet Oral Q6H PRN Theodoro Grist, MD   1 tablet at 11/17/16 1937  . senna-docusate (Senokot-S) tablet 1 tablet  1 tablet Oral QHS PRN Theodoro Grist, MD      . sodium chloride flush (NS) 0.9 % injection 3 mL  3 mL Intravenous Q12H Theodoro Grist, MD   3 mL at 11/17/16 2102  . sodium chloride flush (NS) 0.9 % injection 3 mL  3 mL Intravenous PRN Theodoro Grist, MD      . tamsulosin (FLOMAX) capsule 0.4 mg  0.4 mg Oral QHS Theodoro Grist, MD   0.4 mg at 11/17/16 2101    Past Medical History:  Diagnosis Date  . Cancer ([place])    lung  . Cataract   . Collagen vascular disease ([place])   . History of kidney stones   . Kidney stones 2014  . Squamous cell lung cancer, right ([place]) 05/14/2016    Past Surgical History:  Procedure Laterality Date  . CYSTOSCOPY W/ URETEROSCOPY W/ LITHOTRIPSY    . ENDOBRONCHIAL ULTRASOUND  N/A 06/12/2016   Procedure: ENDOBRONCHIAL ULTRASOUND;  Surgeon: Laverle Hobby, MD;  Location: [facility] ORS;  Service: Pulmonary;  Laterality: N/A;  . PORTA CATH INSERTION N/A 06/26/2016   Procedure: Glori Luis Cath Insertion;  Surgeon: Algernon Huxley, MD;  Location: [place] [facility];  Service: Cardiovascular;  Laterality: N/A;  . TONSILLECTOMY      Social History Social History  Substance Use Topics  . Smoking status: Former Smoker    Packs/day: 0.50    Years: 55.00    Quit date: 03/28/2016  . Smokeless tobacco: Former Systems developer  . Alcohol use No     Comment: Not lately    Family History Family History  Problem Relation Age of Onset  . Heart disease Mother   . Heart disease Father   . Cancer Sister 6       luekemia  No family history of bleeding/clotting disorders, porphyria or autoimmune disease   No Known Allergies   REVIEW OF SYSTEMS (Negative unless checked)  Constitutional: [] Weight loss  [] Fever  [] Chills Cardiac: [] Chest pain   [] Chest pressure   [] Palpitations   [] Shortness of breath when laying flat   [] Shortness of breath at rest   [] Shortness of breath with exertion. Vascular:  [x] Pain in legs with walking   [] Pain in legs at rest   [] Pain in legs when laying flat   [] Claudication   [] Pain in feet when walking  [] Pain in feet at rest  [] Pain in feet when laying flat   [] History of DVT   [] Phlebitis   [x] Swelling in legs   [] Varicose veins   [] Non-healing ulcers Pulmonary:   [] Uses home oxygen   [] Productive cough   [] Hemoptysis   [] Wheeze  [x] COPD   [] Asthma Neurologic:  [] Dizziness  [] Blackouts   [] Seizures   [] History of stroke   [] History of TIA  [] Aphasia   [] Temporary blindness   [] Dysphagia   [] Weakness or numbness in arms   [] Weakness or numbness in legs Musculoskeletal:  [] Arthritis   [] Joint swelling   [x] Joint pain   [] Low back pain Hematologic:  [x] Easy bruising  [x] Easy bleeding   [] Hypercoagulable state   [] Anemic  [] Hepatitis Gastrointestinal:  [] Blood in  stool   [] Vomiting blood  [] Gastroesophageal reflux/heartburn   [] Difficulty swallowing. Genitourinary:  [] Chronic kidney disease   [] Difficult urination  [] Frequent urination  []   Burning with urination   [] Blood in urine Skin:  [] Rashes   [] Ulcers   [] Wounds Psychological:  [] History of anxiety   []  History of major depression.  Physical Examination  Vitals:   11/17/16 1133 11/17/16 2016 11/18/16 0428 11/18/16 0817  BP: 117/72 (!) 100/49 (!) 95/59 102/73  Pulse: (!) 109 85 83 83  Resp:  17 18 (!) 24  Temp: 98 F (36.7 C) 97.7 F (36.5 C) (!) 97.5 F (36.4 C) 97.7 F (36.5 C)  TempSrc: Oral Oral Oral Oral  SpO2: 95% 97% 91% 95%  Weight:   76.3 kg (168 lb 3.2 oz)   Height:       Body mass index is 22.81 kg/m. Gen:  Cachectic/weak -appearing, NAD Head: [place]/AT, No temporalis wasting. Prominent temp pulse not noted. Ear/Nose/Throat: Hearing grossly intact, nares w/o erythema or drainage, oropharynx w/o Erythema/Exudate Eyes: Sclera non-icteric, conjunctiva clear Neck: Trachea midline.  No JVD.  Pulmonary:  Good air movement, respirations not labored, Wheezing with coarse rhonchi. O2 by nasal cannula Cardiac: RRR, normal S1, S2. Vascular:  Vessel Right Left  Radial Palpable Palpable  PT 2+ Palpable 2+ Palpable  DP Trace Palpable Trace Palpable   Gastrointestinal: soft, non-tender/non-distended. No guarding/reflex.  Musculoskeletal: M/S 5/5 throughout.  Extremities without ischemic changes.  No deformity or atrophy. No edema. Neurologic: Sensation grossly intact in extremities.  Symmetrical.  Speech is fluent. Motor exam as listed above. Psychiatric: Judgment intact, Mood & affect appropriate for pt's clinical situation. Dermatologic: No rashes or ulcers noted.  No cellulitis or open wounds. Lymph : No Cervical, Axillary, or Inguinal lymphadenopathy.  CBC Lab Results  Component Value Date   WBC 3.9 11/17/2016   HGB 11.4 (L) 11/17/2016   HCT 33.6 (L) 11/17/2016   MCV 97.1  11/17/2016   PLT 230 11/17/2016    BMET    Component Value Date/Time   NA 138 11/17/2016 0320   K 3.9 11/17/2016 0320   CL 104 11/17/2016 0320   CO2 25 11/17/2016 0320   GLUCOSE 159 (H) 11/17/2016 0320   BUN 22 (H) 11/17/2016 0320   CREATININE 0.80 11/17/2016 0320   CALCIUM 8.4 (L) 11/17/2016 0320   GFRNONAA >60 11/17/2016 0320   GFRAA >60 11/17/2016 0320   Estimated Creatinine Clearance: 82.1 mL/min (by C-G formula based on SCr of 0.8 mg/dL).  COAG Lab Results  Component Value Date   INR 1.33 11/16/2016    Radiology Dg Chest 2 View  Result Date: 11/16/2016 CLINICAL DATA:  Shortness of breath and patient with a history of lung cancer and hospital discharge for pneumonia 09/13/2016. EXAM: CHEST  2 VIEW COMPARISON:  PA and lateral chest 11/12/2016, 11/11/2016 and 10/09/2016. CT chest 10/29/2016. FINDINGS: Streaky airspace disease is seen in the right lower lobe which is new 10/29/2016 CT scan but appears improved since the 2 most recent plain films. Left upper lobe mass lesion appears unchanged. 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 There small scratch the small left pleural effusion with basilar airspace disease, unchanged. IMPRESSION: Hazy right lower lobe airspace disease could be due to atelectasis or pneumonia and appears improved since the 2 most recent plain films of the chest. Left upper lobe mass lesion. Chronic left basilar opacity and small effusion are also again seen. Right perihilar radiation change, stable in appearance. Electronically Signed   By: Inge Rise M.D.   On: 11/16/2016 15:40   Ct Angio Chest Pe W Or Wo Contrast  Result Date: 11/16/2016  CLINICAL DATA:  Pt very poor historian, per family pt with increased SOB that worsened today. Family states recently d/c from hospital with pneumonia. Pt also has hx of lung cancer s/p chemo and radiation. EXAM: CT ANGIOGRAPHY CHEST WITH CONTRAST TECHNIQUE: Multidetector CT  imaging of the chest was performed using the standard protocol during bolus administration of intravenous contrast. Multiplanar CT image reconstructions and MIPs were obtained to evaluate the vascular anatomy. CONTRAST:  7ml of Isovue 370 used. COMPARISON:  Chest x-ray 11/16/2016, chest CT 10/29/2016 FINDINGS: Cardiovascular: Pulmonary arteries are well opacified by contrast bolus. There is acute pulmonary embolus within the right lower lobe posterior basal segment artery. Acute embolus is also identified within the left lower lobe posterior basal segment branches. There is extensive coronary artery disease. The heart is mildly enlarged. Configuration of the heart is normal. No evidence for right heart strain. Trace pericardial effusion. Dense atherosclerotic calcification of the mitral annulus. There is aortic atherosclerosis without aneurysm. Mediastinum/Nodes: The visualized portion of the thyroid gland has a normal appearance. No significant mediastinal adenopathy. Confluent soft tissue density in the hilar regions limits evaluation of adenopathy. Lungs/Pleura: Within the left lung apex, there is an oval mass, partially obscured by a further consolidation, estimated to measure 2.7 cm, previously 1.9 cm. Cavitary mass in the left upper lobe now measures 4.1 x 3.3 cm, previously 4.8 x 4.6 cm. An adjacent solid nodule is 2.1 x 8 2.7 cm, previously 2.1 x 2.8 cm. Radiation changes again noted in the right upper and lower lobes. There is significant increase in bilateral pleural effusions. There is increased an ground-glass opacity in the right lower lobe and right middle lobe, suspicious for infarct, in the setting of acute embolus. Consolidation in the left upper and lower lobes persists. Upper Abdomen: Partially imaged left renal cyst. Musculoskeletal: Extensive spondylosis of the thoracic spine. No suspicious lytic or blastic lesions are identified. Review of the MIP images confirms the above findings.  IMPRESSION: 1. Bilateral pulmonary emboli without evidence for right heart strain. 2. New airspace filling opacities within the right lower lobe and right middle lobe, suspicious for infarcts. 3. Increased bilateral pleural effusions. 4. Persistent significant lung opacities with mixed response to treatment. Cavitary lesion appears smaller while other lesions appear similar in size or increased in size. 5. Mild cardiomegaly and extensive coronary artery disease. 6.  Aortic Atherosclerosis (ICD10-I70.0). Critical Value/emergent results were called by telephone at the time of interpretation on 11/16/2016 at 7:12 pm to Dr. Delman Kitten , who verbally acknowledged these results. Electronically Signed   By: Nolon Nations M.D.   On: 11/16/2016 19:12   US Venous Img Lower Bilateral  Result Date: 11/17/2016 CLINICAL DATA:  Bilateral pulmonary embolism. EXAM: BILATERAL LOWER EXTREMITY VENOUS DOPPLER ULTRASOUND TECHNIQUE: Gray-scale sonography with graded compression, as well as color Doppler and duplex ultrasound were performed to evaluate the lower extremity deep venous systems from the level of the common femoral vein and including the common femoral, femoral, profunda femoral, popliteal and calf veins including the posterior tibial, peroneal and gastrocnemius veins when visible. The superficial great saphenous vein was also interrogated. Spectral Doppler was utilized to evaluate flow at rest and with distal augmentation maneuvers in the common femoral, femoral and popliteal veins. COMPARISON:  None. FINDINGS: RIGHT LOWER EXTREMITY Common Femoral Vein: No evidence of thrombus. Normal compressibility, respiratory phasicity and response to augmentation. Saphenofemoral Junction: No evidence of thrombus. Normal compressibility and flow on color Doppler imaging. Profunda Femoral Vein: No evidence of thrombus. Normal  compressibility and flow on color Doppler imaging. Femoral Vein: No evidence of thrombus. Normal  compressibility, respiratory phasicity and response to augmentation. Popliteal Vein: No evidence of thrombus. Normal compressibility, respiratory phasicity and response to augmentation. Calf Veins: No evidence of thrombus. Normal compressibility and flow on color Doppler imaging. Superficial Great Saphenous Vein: No evidence of thrombus. Normal compressibility and flow on color Doppler imaging. Venous Reflux:  None. Other Findings:  None. LEFT LOWER EXTREMITY Common Femoral Vein: Nonocclusive thrombus is identified. Saphenofemoral Junction: Nonocclusive thrombus is identified. Profunda Femoral Vein: Occlusive thrombus is present. Femoral Vein: Occlusive thrombus is present Popliteal Vein: Occlusive thrombus is present. Calf Veins: Occlusive thrombus is present. Superficial Great Saphenous Vein: Occlusive thrombus is present. Venous Reflux:  None. Other Findings:  None. IMPRESSION: Extensive deep venous thrombosis in the left leg as described above. Negative for right lower extremity DVT. Electronically Signed   By: Inge Rise M.D.   On: 11/17/2016 12:29      Assessment/Plan 1. Left lower extremity DVT associated with PE:  The patient is currently on anticoagulation and tolerating his Eliquis.  Given the extensive bruising and his overall level of debilitation as well as the minimal swelling of his left lower extremity compared to his right and his lack of symptoms with ambulation I do not recommend thrombolysis at this time. 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 Given this is likely to require home oxygen regardless of any attempts at pulmonary thrombectomy and therefore the benefits would be quite minimal and the risks significant. I did raise the possibility of a IVC filter however at this time he is tolerating his anticoagulation without incident. I would've a low  threshold for adding a filter given his overall pulmonary status.  In summary no intervention at this time patient is tolerating anticoagulation and we will follow closely should something change IVC filter would be indicated.   A total of 70 minutes was spent with this patient as well as discussing the case with Dr. Posey Pronto and greater than 50% was spent in counseling and coordination of care with the patient.  Discussion included the treatment options for vascular disease including indications for surgery and intervention.  Also discussed is the appropriate timing of treatment.  In addition medical therapy was discussed.  2. Stage III carcinoma of the lung: Patient will continue his radiation therapy to continue to follow at the [facility] as arranged.   3. Atrial fibrillation: Continue amiodarone as ordered  4.  Congestive heart failure: Continue diuretic therapy and ACE inhibitor  Hortencia Pilar, MD  11/18/2016 8:53 AM    This note was created with Dragon medical transcription system.  Any error is purely unintentional

## 2016-11-18 NOTE — Progress Notes (Signed)
[facility] [place] NAME: Frank Cowan    MR#:  [id number]  DATE OF BIRTH:  [birth year]  SUBJECTIVE:   Came in with leg pain and increasing sob Looks and feels much better, weak dter in the room REVIEW OF SYSTEMS:   Review of Systems  Constitutional: Negative for chills, fever and weight loss.  HENT: Negative for ear discharge, ear pain and nosebleeds.   Eyes: Negative for blurred vision, pain and discharge.  Respiratory: Positive for shortness of breath. Negative for sputum production, wheezing and stridor.   Cardiovascular: Positive for claudication. Negative for chest pain, palpitations, orthopnea and PND.  Gastrointestinal: Negative for abdominal pain, diarrhea, nausea and vomiting.  Genitourinary: Negative for frequency and urgency.  Musculoskeletal: Positive for back pain. Negative for joint pain.  Neurological: Positive for weakness. Negative for sensory change, speech change and focal weakness.  Psychiatric/Behavioral: Negative for depression and hallucinations. The patient is not nervous/anxious.    Tolerating Diet:yes Tolerating PT: SNF  DRUG ALLERGIES:  No Known Allergies  VITALS:  Blood pressure 102/73, pulse 83, temperature 97.7 F (36.5 C), temperature source Oral, resp. rate (!) 24, height 6' (1.829 m), weight 76.3 kg (168 lb 3.2 oz), SpO2 95 %.  PHYSICAL EXAMINATION:   Physical Exam  GENERAL:  76 y.o.-year-old patient lying in the bed with no acute distress. Appears chronically ill EYES: Pupils equal, round, reactive to light and accommodation. No scleral icterus. Extraocular muscles intact.  HEENT: Head atraumatic, normocephalic. Oropharynx and nasopharynx clear.  NECK:  Supple, no jugular venous distention. No thyroid enlargement, no tenderness.  LUNGS: decreased breath sounds bilaterally, no wheezing, rales, rhonchi. No use of accessory muscles of respiration.  CARDIOVASCULAR: S1, S2 normal. No murmurs,  rubs, or gallops.  ABDOMEN: Soft, nontender, nondistended. Bowel sounds present. No organomegaly or mass.  EXTREMITIES: No cyanosis, clubbing or edema b/l.    NEUROLOGIC: Cranial nerves II through XII are intact. No focal Motor or sensory deficits b/l.  weak PSYCHIATRIC:  patient is alert and oriented x 3.  SKIN: No obvious rash, lesion, or ulcer.   LABORATORY PANEL:  CBC  Recent Labs Lab 11/17/16 0320  WBC 3.9  HGB 11.4*  HCT 33.6*  PLT 230    Chemistries   Recent Labs Lab 11/17/16 0320  NA 138  K 3.9  CL 104  CO2 25  GLUCOSE 159*  BUN 22*  CREATININE 0.80  CALCIUM 8.4*   Cardiac Enzymes  Recent Labs Lab 11/17/16 1124  TROPONINI <0.03   RADIOLOGY:  Dg Chest 2 View  Result Date: 11/16/2016 CLINICAL DATA:  Shortness of breath and patient with a history of lung cancer and hospital discharge for pneumonia 09/13/2016. EXAM: CHEST  2 VIEW COMPARISON:  PA and lateral chest 11/12/2016, 11/11/2016 and 10/09/2016. CT chest 10/29/2016. FINDINGS: Streaky airspace disease is seen in the right lower lobe which is new 10/29/2016 CT scan but appears improved since the 2 most recent plain films. Left upper lobe mass lesion appears unchanged. 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 There small scratch the small left pleural effusion with basilar airspace disease, unchanged. IMPRESSION: Hazy right lower lobe airspace disease could be due to atelectasis or pneumonia and appears improved since the 2 most recent plain films of the chest. Left upper lobe mass lesion. Chronic left basilar opacity and small effusion are also again seen. Right perihilar radiation change, stable in appearance. Electronically Signed   By: Marcello Moores  Dalessio M.D.   On: 11/16/2016 15:40   Ct Angio Chest Pe W Or Wo Contrast  Result Date: 11/16/2016 CLINICAL DATA:  Pt very poor historian, per family pt with increased SOB that worsened today. Family states recently d/c from  hospital with pneumonia. Pt also has hx of lung cancer s/p chemo and radiation. EXAM: CT ANGIOGRAPHY CHEST WITH CONTRAST TECHNIQUE: Multidetector CT imaging of the chest was performed using the standard protocol during bolus administration of intravenous contrast. Multiplanar CT image reconstructions and MIPs were obtained to evaluate the vascular anatomy. CONTRAST:  36ml of Isovue 370 used. COMPARISON:  Chest x-ray 11/16/2016, chest CT 10/29/2016 FINDINGS: Cardiovascular: Pulmonary arteries are well opacified by contrast bolus. There is acute pulmonary embolus within the right lower lobe posterior basal segment artery. Acute embolus is also identified within the left lower lobe posterior basal segment branches. There is extensive coronary artery disease. The heart is mildly enlarged. Configuration of the heart is normal. No evidence for right heart strain. Trace pericardial effusion. Dense atherosclerotic calcification of the mitral annulus. There is aortic atherosclerosis without aneurysm. Mediastinum/Nodes: The visualized portion of the thyroid gland has a normal appearance. No significant mediastinal adenopathy. Confluent soft tissue density in the hilar regions limits evaluation of adenopathy. Lungs/Pleura: Within the left lung apex, there is an oval mass, partially obscured by a further consolidation, estimated to measure 2.7 cm, previously 1.9 cm. Cavitary mass in the left upper lobe now measures 4.1 x 3.3 cm, previously 4.8 x 4.6 cm. An adjacent solid nodule is 2.1 x 8 2.7 cm, previously 2.1 x 2.8 cm. Radiation changes again noted in the right upper and lower lobes. There is significant increase in bilateral pleural effusions. There is increased an ground-glass opacity in the right lower lobe and right middle lobe, suspicious for infarct, in the setting of acute embolus. Consolidation in the left upper and lower lobes persists. Upper Abdomen: Partially imaged left renal cyst. Musculoskeletal: Extensive  spondylosis of the thoracic spine. No suspicious lytic or blastic lesions are identified. Review of the MIP images confirms the above findings. IMPRESSION: 1. Bilateral pulmonary emboli without evidence for right heart strain. 2. New airspace filling opacities within the right lower lobe and right middle lobe, suspicious for infarcts. 3. Increased bilateral pleural effusions. 4. Persistent significant lung opacities with mixed response to treatment. Cavitary lesion appears smaller while other lesions appear similar in size or increased in size. 5. Mild cardiomegaly and extensive coronary artery disease. 6.  Aortic Atherosclerosis (ICD10-I70.0). Critical Value/emergent results were called by telephone at the time of interpretation on 11/16/2016 at 7:12 pm to Dr. Delman Kitten , who verbally acknowledged these results. Electronically Signed   By: Nolon Nations M.D.   On: 11/16/2016 19:12   US Venous Img Lower Bilateral  Result Date: 11/17/2016 CLINICAL DATA:  Bilateral pulmonary embolism. EXAM: BILATERAL LOWER EXTREMITY VENOUS DOPPLER ULTRASOUND TECHNIQUE: Gray-scale sonography with graded compression, as well as color Doppler and duplex ultrasound were performed to evaluate the lower extremity deep venous systems from the level of the common femoral vein and including the common femoral, femoral, profunda femoral, popliteal and calf veins including the posterior tibial, peroneal and gastrocnemius veins when visible. The superficial great saphenous vein was also interrogated. Spectral Doppler was utilized to evaluate flow at rest and with distal augmentation maneuvers in the common femoral, femoral and popliteal veins. COMPARISON:  None. FINDINGS: RIGHT LOWER EXTREMITY Common Femoral Vein: No evidence of thrombus. Normal compressibility, respiratory phasicity and response to augmentation. Saphenofemoral  Junction: No evidence of thrombus. Normal compressibility and flow on color Doppler imaging. Profunda Femoral Vein:  No evidence of thrombus. Normal compressibility and flow on color Doppler imaging. Femoral Vein: No evidence of thrombus. Normal compressibility, respiratory phasicity and response to augmentation. Popliteal Vein: No evidence of thrombus. Normal compressibility, respiratory phasicity and response to augmentation. Calf Veins: No evidence of thrombus. Normal compressibility and flow on color Doppler imaging. Superficial Great Saphenous Vein: No evidence of thrombus. Normal compressibility and flow on color Doppler imaging. Venous Reflux:  None. Other Findings:  None. LEFT LOWER EXTREMITY Common Femoral Vein: Nonocclusive thrombus is identified. Saphenofemoral Junction: Nonocclusive thrombus is identified. Profunda Femoral Vein: Occlusive thrombus is present. Femoral Vein: Occlusive thrombus is present Popliteal Vein: Occlusive thrombus is present. Calf Veins: Occlusive thrombus is present. Superficial Great Saphenous Vein: Occlusive thrombus is present. Venous Reflux:  None. Other Findings:  None. IMPRESSION: Extensive deep venous thrombosis in the left leg as described above. Negative for right lower extremity DVT. Electronically Signed   By: Inge Rise M.D.   On: 11/17/2016 12:29   ASSESSMENT AND PLAN:  Frank Cowan  is a 76 y.o. 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  #1. Bilateral pulmonary embolism and left LE extensive DVT -pt was on heparin intravenously, change to Eliquis today -Vascular sx consult appreciated. Dr schnier does not recommend thrombolysis of Left LE DVT.   #2. Hypotension, due to pulmonary embolism -stable  #3. Atrial fibrillation with rapid ventricular  response -resolved HR in the 80's HR stable. Not requiring any Rate controlling meds -if need be start po BB -seen by cardiology.  #4 Gen weakness PT to see pt CSW for d/c planning. Pt is from Rehab and will return there at d/c  #5  Positive sputum cx  Kleibseilla and Pseudomonas -recieved call from [facility] by PA with positive sputum cx with pseudomonas and kleibseilla.  -given overall presentation will treat empirically. WBC normal -spoke with ID treat cipro 750 mg bid x 14 days  Physical therapy recommends rehabilitation. Patient has a bed at rehabilitation place. He will be discharged tomorrow. Discontinue Telesitter today. Spoke with dter  Case discussed with Care Management/Social Worker. Management plans discussed with the patient, family and they are in agreement.  CODE STATUS: FULL  DVT Prophylaxis: eliquis  TOTAL TIME TAKING CARE OF THIS PATIENT: 30 minutes.  >50% time spent on counselling and coordination of care pt and dter  POSSIBLE D/C IN 1-2 DAYS, DEPENDING ON CLINICAL CONDITION.  Note: This dictation was prepared with Dragon dictation along with smaller phrase technology. Any transcriptional errors that result from this process are unintentional.  Lotoya Casella M.D on 11/18/2016 at 12:15 PM  Between 7am to 6pm - Pager - [phone]  After 6pm go to [email or web address] - password [email or web address]  Office  [phone]  CC: Primary care physician; Claiborne Billings, MD

## 2016-11-18 NOTE — Progress Notes (Signed)
OT Cancellation Note  Patient Details Name: Frank Cowan MRN: [id number] DOB: October 26, 1940   Cancelled Treatment:    Reason Eval/Treat Not Completed: Patient declined, no reason specified. Pt still within first 48 hours from start of anti-coagulation for bilateral PEs, however spoke with PT, MD provided verbal okay to see. Pt asleep upon attempt, declining OT evaluation at this time, as he is too fatigued. Checked O2 sats, 87% on 3.5L. Verbal cues provided for pursed lip breathing, increased O2 sats to 91% within 1 minute. RN notified. Will re-attempt OT evaluation next date.   Jeni Salles, MPH, MS, OTR/L ascom [phone] 11/18/16, 2:13 PM

## 2016-11-18 NOTE — Clinical Social Work Note (Signed)
Clinical Social Work Assessment  Patient Details  Name: Frank Cowan MRN: [id number] Date of Birth: 09/15/1940  Date of referral:  11/18/16               Reason for consult:  Facility Placement, Discharge Planning                Permission sought to share information with:  Family Supports Permission granted to share information::  Yes, Verbal Permission Granted  Name::     Frank Cowan  Relationship::  daughter  Contact Information:  [phone]  Housing/Transportation Living arrangements for the past 2 months:  [place] of Information:  Adult Children Patient Interpreter Needed:  None Criminal Activity/Legal Involvement Pertinent to Current Situation/Hospitalization:  No - Comment as needed Significant Relationships:  Adult Children Lives with:  Facility Resident Do you feel safe going back to the place where you live?  Yes Need for family participation in patient care:  Yes (Comment)  Care giving concerns:  No care giving concerns identified.   Social Worker assessment / plan:  CSW met with pt and daughter to address consult for New SNF. Pt was admitted from [facility], where he is a STR patient. PT is currently recommending SNF for continued rehab. Pt's daughter is aware and agreeable to discharge plan.  Pt's telesitter was just discontinued and will need to waiting 24 hours prior to discharge. MD is aware. CSW updated facility and pt will be able to return tomorrow. CSW will continue to follow.   Employment status:  Retired Nurse, adult PT Recommendations:  [place] / Referral to community resources:  [place]  Patient/Family's Response to care:  Pt's daughter was Patent attorney of CSW support.   Patient/Family's Understanding of and Emotional Response to Diagnosis, Current Treatment, and Prognosis:  Pt's daughter understands that pt would benefit from continued STR.    Emotional Assessment Appearance:  Appears stated age Attitude/Demeanor/Rapport:   (Appropriate) Affect (typically observed):  Accepting, Adaptable, Pleasant Orientation:  Oriented to Self Alcohol / Substance use:  Not Applicable Psych involvement (Current and /or in the community):  No (Comment)  Discharge Needs  Concerns to be addressed:  Adjustment to Illness Readmission within the last 30 days:  No Current discharge risk:  Chronically ill Barriers to Discharge:  Continued Medical Work up, Other Oncologist discontinued 8/7)   Darden Dates, LCSW 11/18/2016, 11:39 AM

## 2016-11-18 NOTE — Care Management (Signed)
It was relayed during progression that patient has a Oncologist.  CM informed staff that patient needs to be without tele sitter for 24 hours prior to discharge.

## 2016-11-18 NOTE — Evaluation (Signed)
Physical Therapy Evaluation Patient Details Name: Frank Cowan MRN: [id number] DOB: 1940/12/23 Today's Date: 11/18/2016   History of Present Illness  76 y.o. 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  He had been at rehab facility but with shortness of breath was sent to hosptial, found to have b/l PEs and L LE DVT.    Clinical Impression  Pt initially sleeping, more interested in continuing nap that participating with PT but ultimately was willing to do some light activity and ambulation.  He was on 2 liters O2 t/o the session and never did have saturations in the 90s.  His O2 dropped to mid 80s with the brief bout of ambulation and he was very fatigued.  Overall he needed some assist with most acts, but actually did fairly well.  MD gave verbal okay to see pt despite having less than 48 hrs of anticoags with dx of PEs.  Pt was very fatigued with the effort of 25 ft and likely could not have done much more.    Follow Up Recommendations SNF    Equipment Recommendations       Recommendations for Other Services       Precautions / Restrictions Precautions Precautions: Fall Restrictions Weight Bearing Restrictions: No Other Position/Activity Restrictions: MD gave verbal OK to walk/treat patient despite not yet having had 48 hrs of anticoagulants      Mobility  Bed Mobility Overal bed mobility: Independent             General bed mobility comments: Pt is slow and labored with getting to/from sitting but needed only cuing and CGA w/o direct assist for bed mobility.  Unable to scoot in bed w/o assist.  Transfers Overall transfer level: Needs assistance Equipment used:  Rolling walker (2 wheeled) Transfers: Sit to/from Stand Sit to Stand: Min assist         General transfer comment: Pt weak in attempted to rise from sitting, needed light assist to keep weight forward and to rise.  Pt also poorly aware of hand placement/sequencing and need assist  Ambulation/Gait Ambulation/Gait assistance: Min assist Ambulation Distance (Feet): 25 Feet Assistive device: Rolling walker (2 wheeled)       General Gait Details: Pt with slow, cautious gait, heavily reliant on walker.  Pt with O2 in the high 80s starting the effort (on 2 liters) and dropped to mid 80s post ambulation.   Pt very tired with the minimal amount of ambulation.   Stairs            Wheelchair Mobility    Modified Rankin (Stroke Patients Only)       Balance Overall balance assessment: Needs assistance   Sitting balance-Leahy Scale: Fair       Standing balance-Leahy Scale: Fair                               Pertinent Vitals/Pain Pain Assessment:  (chronic shoulder and general RA pain, not rated)    Home Living Family/patient expects to be discharged to:: Skilled nursing facility                      Prior  Function Level of Independence: Independent with assistive device(s)         Comments: Prior to 1 month ago pt was able to do some limited driving, used walker initially in the day and would transition to QC once he was warmed up.     Hand Dominance        Extremity/Trunk Assessment   Upper Extremity Assessment Upper Extremity Assessment:  (no L shld elevation, UEs grossly 3-/5)    Lower Extremity Assessment Lower Extremity Assessment: Generalized weakness;Overall WFL for tasks assessed (grossly 4-/5 t/o)       Communication   Communication: No difficulties  Cognition Arousal/Alertness: Lethargic Behavior During Therapy: WFL for tasks assessed/performed Overall Cognitive Status: Within Functional Limits for tasks assessed                                         General Comments      Exercises     Assessment/Plan    PT Assessment Patient needs continued PT services  PT Problem List Decreased strength;Decreased activity tolerance;Decreased range of motion;Decreased balance;Decreased mobility;Decreased coordination;Decreased cognition;Decreased safety awareness;Decreased knowledge of use of DME;Cardiopulmonary status limiting activity       PT Treatment Interventions DME instruction;Gait training;Stair training;Functional mobility training;Therapeutic activities;Therapeutic exercise;Balance training;Neuromuscular re-education;Cognitive remediation;Patient/family education    PT Goals (Current goals can be found in the Care Plan section)  Acute Rehab PT Goals Patient Stated Goal: go home PT Goal Formulation: With patient Time For Goal Achievement: 12/02/16 Potential to Achieve Goals: Fair    Frequency Min 2X/week   Barriers to discharge        Co-evaluation               AM-PAC PT "6 Clicks" Daily Activity  Outcome Measure Difficulty turning over in bed (including adjusting bedclothes, sheets and blankets)?: A Lot Difficulty moving from lying on back to sitting on the side of the bed? : A Lot Difficulty sitting down on and standing up from a chair with arms (e.g., wheelchair, bedside commode, etc,.)?: Total Help needed moving to and from a bed to chair (including a wheelchair)?: A Little Help needed walking in hospital room?: A Lot Help needed climbing 3-5 steps with a railing? : A Lot 6 Click Score: 12    End of Session Equipment Utilized During Treatment: Gait belt;Oxygen (2 liters) Activity Tolerance: Patient limited by fatigue Patient left: with bed alarm set;with call bell/phone within reach;with family/visitor present   PT Visit Diagnosis: Muscle weakness (generalized) (M62.81);Difficulty in walking, not elsewhere classified (R26.2)    Time: 3734-2876 PT Time  Calculation (min) (ACUTE ONLY): 22 min   Charges:   PT Evaluation $PT Eval Moderate Complexity: 1 Mod     PT G CodesKreg Shropshire, DPT 11/18/2016, 11:30 AM

## 2016-11-18 NOTE — Progress Notes (Signed)
[facility] Encounter Note  Patient: Frank Cowan / Admit Date: 11/16/2016 / Date of Encounter: 11/18/2016, 1:27 PM   Subjective: Patient still has cough and congestion but less shortness of breath. Atrial fibrillation with better heart rate control  Review of Systems: Positive for: Cough congestion Negative for: Vision change, hearing change, syncope, dizziness, nausea, vomiting,diarrhea, bloody stool, stomach pain, positive for cough, congestion, negative for diaphoresis, urinary frequency, urinary pain,skin lesions, skin rashes Others previously listed  Objective: Telemetry: Atrial fibrillation with controlled ventricular rate Physical Exam: Blood pressure (!) 107/54, pulse 88, temperature 98.1 F (36.7 C), temperature source Oral, resp. rate 18, height 6' (1.829 m), weight 76.3 kg (168 lb 3.2 oz), SpO2 96 %. Body mass index is 22.81 kg/m. General: Well developed, well nourished, in no acute distress. Head: Normocephalic, atraumatic, sclera non-icteric, no xanthomas, nares are without discharge. Neck: No apparent masses Lungs: Normal respirations with diffuse wheezes, some rhonchi, no rales , few crackles   Heart: Irregular rate and rhythm, normal S1 S2, no murmur, no rub, no gallop, PMI is normal size and placement, carotid upstroke normal without bruit, jugular venous pressure normal Abdomen: Soft, non-tender, non-distended with normoactive bowel sounds. No hepatosplenomegaly. Abdominal aorta is normal size without bruit Extremities: Trace edema, no clubbing, no cyanosis, no ulcers,  Peripheral: 2+ radial, 2+ femoral, 2+ dorsal pedal pulses Neuro: Alert and oriented. Moves all extremities spontaneously. Psych:  Responds to questions appropriately with a normal affect.   Intake/Output Summary (Last 24 hours) at 11/18/16 1327 Last data filed at 11/18/16 1013  Gross per 24 hour  Intake              240 ml  Output              350 ml  Net             -110 ml     Inpatient Medications:  . apixaban  10 mg Oral BID   Followed by  . [START ON 11/24/2016] apixaban  5 mg Oral BID  . ciprofloxacin  750 mg Oral BID  . diclofenac sodium  4 g Topical QID  . docusate sodium  100 mg Oral BID  . FLUoxetine  40 mg Oral Daily  . folic acid  1 mg Oral Daily  . gabapentin  100 mg Oral TID  . hydroxychloroquine  200 mg Oral Daily  . mouth rinse  15 mL Mouth Rinse BID  . sodium chloride flush  3 mL Intravenous Q12H  . tamsulosin  0.4 mg Oral QHS   Infusions:  . sodium chloride Stopped (11/17/16 1045)    Labs:  Recent Labs  11/16/16 1511 11/17/16 0320  NA 138 138  K 3.6 3.9  CL 104 104  CO2 25 25  GLUCOSE 98 159*  BUN 18 22*  CREATININE 0.67 0.80  CALCIUM 8.5* 8.4*   No results for input(s): AST, ALT, ALKPHOS, BILITOT, PROT, ALBUMIN in the last 72 hours.  Recent Labs  11/16/16 1511 11/17/16 0320  WBC 5.5 3.9  HGB 11.2* 11.4*  HCT 33.2* 33.6*  MCV 95.8 97.1  PLT 260 230    Recent Labs  11/16/16 2125 11/17/16 0320 11/17/16 0913 11/17/16 1124  TROPONINI <0.03 <0.03 <0.03 <0.03   Invalid input(s): POCBNP No results for input(s): HGBA1C in the last 72 hours.   Weights: Filed Weights   11/16/16 1503 11/17/16 0429 11/18/16 0428  Weight: 77.1 kg (170 lb) 76 kg (167 lb 8 oz) 76.3 kg (  168 lb 3.2 oz)     Radiology/Studies:  Dg Chest 2 View  Result Date: 11/16/2016 CLINICAL DATA:  Shortness of breath and patient with a history of lung cancer and hospital discharge for pneumonia 09/13/2016. EXAM: CHEST  2 VIEW COMPARISON:  PA and lateral chest 11/12/2016, 11/11/2016 and 10/09/2016. CT chest 10/29/2016. FINDINGS: Streaky airspace disease is seen in the right lower lobe which is new 10/29/2016 CT scan but appears improved since the 2 most recent plain films. Left upper lobe mass lesion appears unchanged. 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 There small scratch the small left pleural effusion  with basilar airspace disease, unchanged. IMPRESSION: Hazy right lower lobe airspace disease could be due to atelectasis or pneumonia and appears improved since the 2 most recent plain films of the chest. Left upper lobe mass lesion. Chronic left basilar opacity and small effusion are also again seen. Right perihilar radiation change, stable in appearance. Electronically Signed   By: Inge Rise M.D.   On: 11/16/2016 15:40   Ct Angio Chest Pe W Or Wo Contrast  Result Date: 11/16/2016 CLINICAL DATA:  Pt very poor historian, per family pt with increased SOB that worsened today. Family states recently d/c from hospital with pneumonia. Pt also has hx of lung cancer s/p chemo and radiation. EXAM: CT ANGIOGRAPHY CHEST WITH CONTRAST TECHNIQUE: Multidetector CT imaging of the chest was performed using the standard protocol during bolus administration of intravenous contrast. Multiplanar CT image reconstructions and MIPs were obtained to evaluate the vascular anatomy. CONTRAST:  30ml of Isovue 370 used. COMPARISON:  Chest x-ray 11/16/2016, chest CT 10/29/2016 FINDINGS: Cardiovascular: Pulmonary arteries are well opacified by contrast bolus. There is acute pulmonary embolus within the right lower lobe posterior basal segment artery. Acute embolus is also identified within the left lower lobe posterior basal segment branches. There is extensive coronary artery disease. The heart is mildly enlarged. Configuration of the heart is normal. No evidence for right heart strain. Trace pericardial effusion. Dense atherosclerotic calcification of the mitral annulus. There is aortic atherosclerosis without aneurysm. Mediastinum/Nodes: The visualized portion of the thyroid gland has a normal appearance. No significant mediastinal adenopathy. Confluent soft tissue density in the hilar regions limits evaluation of adenopathy. Lungs/Pleura: Within the left lung apex, there is an oval mass, partially obscured by a further  consolidation, estimated to measure 2.7 cm, previously 1.9 cm. Cavitary mass in the left upper lobe now measures 4.1 x 3.3 cm, previously 4.8 x 4.6 cm. An adjacent solid nodule is 2.1 x 8 2.7 cm, previously 2.1 x 2.8 cm. Radiation changes again noted in the right upper and lower lobes. There is significant increase in bilateral pleural effusions. There is increased an ground-glass opacity in the right lower lobe and right middle lobe, suspicious for infarct, in the setting of acute embolus. Consolidation in the left upper and lower lobes persists. Upper Abdomen: Partially imaged left renal cyst. Musculoskeletal: Extensive spondylosis of the thoracic spine. No suspicious lytic or blastic lesions are identified. Review of the MIP images confirms the above findings. IMPRESSION: 1. Bilateral pulmonary emboli without evidence for right heart strain. 2. New airspace filling opacities within the right lower lobe and right middle lobe, suspicious for infarcts. 3. Increased bilateral pleural effusions. 4. Persistent significant lung opacities with mixed response to treatment. Cavitary lesion appears smaller while other lesions appear similar in size or increased in size. 5. Mild cardiomegaly and extensive coronary artery disease. 6.  Aortic Atherosclerosis (ICD10-I70.0).  Critical Value/emergent results were called by telephone at the time of interpretation on 11/16/2016 at 7:12 pm to Dr. Delman Kitten , who verbally acknowledged these results. Electronically Signed   By: Nolon Nations M.D.   On: 11/16/2016 19:12   US Venous Img Lower Bilateral  Result Date: 11/17/2016 CLINICAL DATA:  Bilateral pulmonary embolism. EXAM: BILATERAL LOWER EXTREMITY VENOUS DOPPLER ULTRASOUND TECHNIQUE: Gray-scale sonography with graded compression, as well as color Doppler and duplex ultrasound were performed to evaluate the lower extremity deep venous systems from the level of the common femoral vein and including the common femoral, femoral,  profunda femoral, popliteal and calf veins including the posterior tibial, peroneal and gastrocnemius veins when visible. The superficial great saphenous vein was also interrogated. Spectral Doppler was utilized to evaluate flow at rest and with distal augmentation maneuvers in the common femoral, femoral and popliteal veins. COMPARISON:  None. FINDINGS: RIGHT LOWER EXTREMITY Common Femoral Vein: No evidence of thrombus. Normal compressibility, respiratory phasicity and response to augmentation. Saphenofemoral Junction: No evidence of thrombus. Normal compressibility and flow on color Doppler imaging. Profunda Femoral Vein: No evidence of thrombus. Normal compressibility and flow on color Doppler imaging. Femoral Vein: No evidence of thrombus. Normal compressibility, respiratory phasicity and response to augmentation. Popliteal Vein: No evidence of thrombus. Normal compressibility, respiratory phasicity and response to augmentation. Calf Veins: No evidence of thrombus. Normal compressibility and flow on color Doppler imaging. Superficial Great Saphenous Vein: No evidence of thrombus. Normal compressibility and flow on color Doppler imaging. Venous Reflux:  None. Other Findings:  None. LEFT LOWER EXTREMITY Common Femoral Vein: Nonocclusive thrombus is identified. Saphenofemoral Junction: Nonocclusive thrombus is identified. Profunda Femoral Vein: Occlusive thrombus is present. Femoral Vein: Occlusive thrombus is present Popliteal Vein: Occlusive thrombus is present. Calf Veins: Occlusive thrombus is present. Superficial Great Saphenous Vein: Occlusive thrombus is present. Venous Reflux:  None. Other Findings:  None. IMPRESSION: Extensive deep venous thrombosis in the left leg as described above. Negative for right lower extremity DVT. Electronically Signed   By: Inge Rise M.D.   On: 11/17/2016 12:29     Assessment and Recommendation  76 y.o. male with the small cell lung cancer stage III having decrease  mobilization and lower deep venous thrombosis and bilateral pulmonary embolism causing atrial fibrillation with rapid ventricular rate now more controlled on appropriate medication management next 1. No further cardiac diagnostics necessary at this time 2. Anticoagulation for pulmonary embolism and deep venous thrombosis and atrial fibrillation 3. Metoprolol for heart rate control of atrial fibrillation with a goal heart rate between 60 and 90 bpm as needed 4. Begin ambulation and further treatment options after about  Signed, Serafina Royals M.D. FACC

## 2016-11-19 ENCOUNTER — Telehealth: Payer: Self-pay | Admitting: *Deleted

## 2016-11-19 LAB — GLUCOSE, CAPILLARY: GLUCOSE-CAPILLARY: 89 mg/dL (ref 65–99)

## 2016-11-19 MED ORDER — APIXABAN 5 MG PO TABS: ORAL_TABLET | ORAL | 2 refills | Status: AC

## 2016-11-19 MED ORDER — CIPROFLOXACIN HCL 750 MG PO TABS: 750.0000 mg | ORAL_TABLET | Freq: Two times a day (BID) | ORAL | 0 refills | Status: DC

## 2016-11-19 NOTE — Progress Notes (Signed)
OT Cancellation Note  Patient Details Name: Frank Cowan MRN: [id number] DOB: 08-22-1940   Cancelled Treatment:    Reason Eval/Treat Not Completed: Other (comment). Pt in process to discharge from facility, will hold OT evaluation at this time. Will continue to follow for appropriateness of OT evaluation if pt does not discharge from hospital today.   Jeni Salles, MPH, MS, OTR/L ascom [phone] 11/19/16, 12:11 PM

## 2016-11-19 NOTE — Progress Notes (Signed)
EMS called for transfer

## 2016-11-19 NOTE — Discharge Summary (Signed)
[place] [facility] [place] NAME: Frank Cowan    MR#:  [id number]  DATE OF BIRTH:  04-Oct-1940  DATE OF ADMISSION:  11/16/2016 ADMITTING PHYSICIAN: Theodoro Grist, MD  DATE OF DISCHARGE: 11/19/2016  PRIMARY CARE PHYSICIAN: Claiborne Billings, MD    ADMISSION DIAGNOSIS:  Bilateral pulmonary embolism ([place]) [I26.99] Atrial fibrillation with RVR ([place]) [I48.91] Other acute pulmonary embolism without acute cor pulmonale ([facility]) [I26.99]  DISCHARGE DIAGNOSIS:  Bilateral PE and left LE extensive  DVT Squamous cell lung cancer Afib rate controlled SECONDARY DIAGNOSIS:   Past Medical History:  Diagnosis Date  . Cancer ([place])    lung  . Cataract   . Collagen vascular disease ([place])   . History of kidney stones   . Kidney stones 2014  . 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  #1. Bilateral pulmonary embolism and left LE extensive DVT -pt was on heparin intravenously, change to Eliquis ---doing well -Vascular sx consult appreciated. Dr schnier does not recommend thrombolysis of Left LE DVT.  #2. Hypotension, due to pulmonary embolism -stable  #3. Atrial fibrillation with rapid ventricular response -resolved HR in the 80's HR stable. Not requiring any Rate controlling meds -if need be start po BB -seen by cardiology.  #4 Gen weakness PT recommends rehab CSW for d/c planning. Pt is from Rehab and will return there at d/c  #5  Positive sputum cx  Kleibseilla and Pseudomonas -recieved call from [facility] by PA with positive  sputum cx with pseudomonas and kleibseilla.  -given overall presentation will treat empirically. WBC normal -spoke with ID treat cipro 750 mg bid x 14 days  Physical therapy recommends rehabilitation. Patient has a bed at rehabilitation place. He will be discharged today to rehab. Discontinue Telesitter today. Spoke with dter  Case discussed with Care Management/Social Worker. Management plans discussed with the patient, family and they are in agreement.  CODE STATUS: FULL  DVT Prophylaxis: eliquis   CONSULTS OBTAINED:  Treatment Team:  Corey Skains, MD Schnier, Dolores Lory, MD  DRUG ALLERGIES:  No Known Allergies  DISCHARGE MEDICATIONS:   Current Discharge Medication List    START taking these medications   Details  apixaban (ELIQUIS) 5 MG TABS tablet Take 2 tabs twice a day till 11/23/16 and then take 1 tab (5 mg ) twice a day Qty: 60 tablet, Refills: 2    ciprofloxacin (CIPRO) 750 MG tablet Take 1 tablet (750 mg total) by mouth 2 (two) times daily. Qty: 28 tablet, Refills: 0      CONTINUE these medications which have NOT CHANGED   Details  acetaminophen (TYLENOL) 500 MG tablet Take 500 mg by mouth every 8 (eight) hours as needed for mild pain or headache.     diclofenac sodium (VOLTAREN) 1 % GEL Apply topically 4 (four) times daily.    FLUoxetine (PROZAC) 40 MG capsule Take 40 mg by mouth daily.     folic acid (FOLVITE) 1 MG tablet Take 1 mg by mouth daily.    Associated Diagnoses: Mass of upper lobe of left lung    gabapentin (NEURONTIN) 100 MG  capsule Take 100 mg by mouth 3 (three) times daily.    hydroxychloroquine (PLAQUENIL) 200 MG tablet Take 200 mg by mouth daily.    tamsulosin (FLOMAX) 0.4 MG CAPS capsule Take 0.4 mg by mouth at bedtime.    Associated Diagnoses: Mass of upper lobe of left lung    bisacodyl (DULCOLAX) 5 MG EC tablet Take 5 mg by mouth daily as needed for moderate constipation.    esomeprazole (NEXIUM) 40 MG capsule Take 1  capsule (40 mg total) by mouth daily. Qty: 30 capsule, Refills: 1    lidocaine (XYLOCAINE) 2 % solution Use as directed 20 mLs in the mouth or throat as needed for mouth pain. Qty: 100 mL, Refills: 0    naproxen sodium (ANAPROX) 220 MG tablet Take 220 mg by mouth 2 (two) times daily as needed. For pain    oxyCODONE-acetaminophen (PERCOCET/ROXICET) 5-325 MG tablet Take 1-2 tablets by mouth every 6 (six) hours as needed for severe pain. Qty: 30 tablet, Refills: 0   Associated Diagnoses: Squamous cell lung cancer, right ([place])      STOP taking these medications     moxifloxacin (AVELOX) 400 MG tablet      levofloxacin (LEVAQUIN) 500 MG tablet      megestrol (MEGACE) 40 MG tablet      methotrexate (RHEUMATREX) 2.5 MG tablet         If you experience worsening of your admission symptoms, develop shortness of breath, life threatening emergency, suicidal or homicidal thoughts you must seek medical attention immediately by calling 911 or calling your MD immediately  if symptoms less severe.  You Must read complete instructions/literature along with all the possible adverse reactions/side effects for all the Medicines you take and that have been prescribed to you. Take any new Medicines after you have completely understood and accept all the possible adverse reactions/side effects.   Please note  You were cared for by a hospitalist during your hospital stay. If you have any questions about your discharge medications or the care you received while you were in the hospital after you are discharged, you can call the unit and asked to speak with the hospitalist on call if the hospitalist that took care of you is not available. Once you are discharged, your primary care physician will handle any further medical issues. Please note that NO REFILLS for any discharge medications will be authorized once you are discharged, as it is imperative that you return to your primary care physician (or establish  a relationship with a primary care physician if you do not have one) for your aftercare needs so that they can reassess your need for medications and monitor your lab values. Today   SUBJECTIVE   Shoulder pain  VITAL SIGNS:  Blood pressure 123/77, pulse 97, temperature 97.8 F (36.6 C), temperature source Oral, resp. rate 18, height 6' (1.829 m), weight 76.4 kg (168 lb 8 oz), SpO2 95 %.  I/O:   Intake/Output Summary (Last 24 hours) at 11/19/16 0822 Last data filed at 11/19/16 0736  Gross per 24 hour  Intake              360 ml  Output              350 ml  Net               10 ml    PHYSICAL EXAMINATION:  GENERAL:  76 y.o.-year-old patient lying in the bed with no acute distress.  EYES: Pupils  equal, round, reactive to light and accommodation. No scleral icterus. Extraocular muscles intact.  HEENT: Head atraumatic, normocephalic. Oropharynx and nasopharynx clear.  NECK:  Supple, no jugular venous distention. No thyroid enlargement, no tenderness.  LUNGS:decreased breath sounds bilaterally, no wheezing, rales,rhonchi or crepitation. No use of accessory muscles of respiration.  CARDIOVASCULAR: S1, S2 normal. No murmurs, rubs, or gallops.  ABDOMEN: Soft, non-tender, non-distended. Bowel sounds present. No organomegaly or mass.  EXTREMITIES: No pedal edema, cyanosis, or clubbing.  NEUROLOGIC: Cranial nerves II through XII are intact. Muscle strength 5/5 in all extremities. Sensation intact. Gait not checked.  PSYCHIATRIC: The patient is alert and oriented x 3. SKIN: No obvious rash, lesion, or ulcer.   DATA REVIEW:   CBC   Recent Labs Lab 11/17/16 0320  WBC 3.9  HGB 11.4*  HCT 33.6*  PLT 230    Chemistries   Recent Labs Lab 11/17/16 0320  NA 138  K 3.9  CL 104  CO2 25  GLUCOSE 159*  BUN 22*  CREATININE 0.80  CALCIUM 8.4*    Microbiology Results   Recent Results (from the past 240 hour(s))  Blood culture (routine x 2)     Status: None (Preliminary result)    Collection Time: 11/16/16  3:40 PM  Result Value Ref Range Status   Specimen Description BLOOD BLOOD RIGHT HAND  Final   Special Requests   Final    BOTTLES DRAWN AEROBIC AND ANAEROBIC Blood Culture adequate volume   Culture NO GROWTH 3 DAYS  Final   Report Status PENDING  Incomplete  Blood culture (routine x 2)     Status: None (Preliminary result)   Collection Time: 11/16/16  3:53 PM  Result Value Ref Range Status   Specimen Description BLOOD BLOOD LEFT HAND  Final   Special Requests   Final    BOTTLES DRAWN AEROBIC AND ANAEROBIC Blood Culture adequate volume   Culture NO GROWTH 3 DAYS  Final   Report Status PENDING  Incomplete  MRSA PCR Screening     Status: None   Collection Time: 11/16/16 11:14 PM  Result Value Ref Range Status   MRSA by PCR NEGATIVE NEGATIVE Final    Comment:        The GeneXpert MRSA Assay (FDA approved for NASAL specimens only), is one component of a comprehensive MRSA colonization surveillance program. It is not intended to diagnose MRSA infection nor to guide or monitor treatment for MRSA infections.     RADIOLOGY:  US Venous Img Lower Bilateral  Result Date: 11/17/2016 CLINICAL DATA:  Bilateral pulmonary embolism. EXAM: BILATERAL LOWER EXTREMITY VENOUS DOPPLER ULTRASOUND TECHNIQUE: Gray-scale sonography with graded compression, as well as color Doppler and duplex ultrasound were performed to evaluate the lower extremity deep venous systems from the level of the common femoral vein and including the common femoral, femoral, profunda femoral, popliteal and calf veins including the posterior tibial, peroneal and gastrocnemius veins when visible. The superficial great saphenous vein was also interrogated. Spectral Doppler was utilized to evaluate flow at rest and with distal augmentation maneuvers in the common femoral, femoral and popliteal veins. COMPARISON:  None. FINDINGS: RIGHT LOWER EXTREMITY Common Femoral Vein: No evidence of thrombus. Normal  compressibility, respiratory phasicity and response to augmentation. Saphenofemoral Junction: No evidence of thrombus. Normal compressibility and flow on color Doppler imaging. Profunda Femoral Vein: No evidence of thrombus. Normal compressibility and flow on color Doppler imaging. Femoral Vein: No evidence of thrombus. Normal compressibility, respiratory phasicity and response to augmentation. Popliteal Vein:  No evidence of thrombus. Normal compressibility, respiratory phasicity and response to augmentation. Calf Veins: No evidence of thrombus. Normal compressibility and flow on color Doppler imaging. Superficial Great Saphenous Vein: No evidence of thrombus. Normal compressibility and flow on color Doppler imaging. Venous Reflux:  None. Other Findings:  None. LEFT LOWER EXTREMITY Common Femoral Vein: Nonocclusive thrombus is identified. Saphenofemoral Junction: Nonocclusive thrombus is identified. Profunda Femoral Vein: Occlusive thrombus is present. Femoral Vein: Occlusive thrombus is present Popliteal Vein: Occlusive thrombus is present. Calf Veins: Occlusive thrombus is present. Superficial Great Saphenous Vein: Occlusive thrombus is present. Venous Reflux:  None. Other Findings:  None. IMPRESSION: Extensive deep venous thrombosis in the left leg as described above. Negative for right lower extremity DVT. Electronically Signed   By: Inge Rise M.D.   On: 11/17/2016 12:29     Management plans discussed with the patient, family and they are in agreement.  CODE STATUS:     Code Status Orders        Start     Ordered   11/16/16 2223  Full code  Continuous     11/16/16 2222    Code Status History    Date Active Date Inactive Code Status Order ID Comments User Context   09/10/2016 12:37 PM 09/13/2016  3:11 PM Full Code [id number]  Max Sane, MD Inpatient      TOTAL TIME TAKING CARE OF THIS PATIENT: 40 minutes.    Shalee Paolo M.D on 11/19/2016 at 8:22 AM  Between 7am to 6pm - Pager -  [phone] After 6pm go to [email or web address] - password [email or web address] [place] [facility]  Office  [phone]  CC: Primary care physician; Claiborne Billings, MD

## 2016-11-19 NOTE — Progress Notes (Signed)
Physical Therapy Treatment Patient Details Name: Frank Cowan MRN: [id number] DOB: [birth year] Today's Date: 11/19/2016    History of Present Illness 76 y.o. 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  He had been at rehab facility but with shortness of breath was sent to hosptial, found to have b/l PEs and L LE DVT.      PT Comments    Pt agrees to session.  93% on room air.  To edge of bed with min/mod a.  Stood with walker and min assist.  Initially leaning to left and required mod assist to remain upright but was able to correct with cues and time.  Ambulated 5' then stated he needed to void - urine on floor noted.  Wheelchair used to get pt in bathroom for safety.  Stood to void with min assist.  He then was able to ambulate 101' with walker and min assist with wheelchair follow with daughter.  Overall gait quality decreases with distance.  Pt encouraged to sit and O2 sats checked.  81% on 3 lpm.  Pt given time and breathing cues to increase O2 but after several minutes remained in low 80's.  Increased to 4 lpm and he was able to regain sats to low 90's.  Returned to bed with min assist x 1. Returned to 3 lpm and sats remained in mid to high 90's after several minutes of monitoring.   Follow Up Recommendations  SNF     Equipment Recommendations       Recommendations for Other Services       Precautions / Restrictions Precautions Precautions: Fall Precaution Comments: monitor O2 sats Restrictions Weight Bearing Restrictions: No Other Position/Activity Restrictions: MD gave verbal OK to walk/treat patient despite not yet having had 48 hrs of anticoagulants    Mobility   Bed Mobility Overal bed mobility: Needs Assistance Bed Mobility: Supine to Sit;Sit to Supine     Supine to sit: Min assist;Mod assist Sit to supine: Min assist      Transfers Overall transfer level: Needs assistance Equipment used: Right platform walker Transfers: Sit to/from Stand Sit to Stand: Min assist         General transfer comment: leans to left when standing  Ambulation/Gait Ambulation/Gait assistance: Min assist Ambulation Distance (Feet): 35 Feet Assistive device: Rolling walker (2 wheeled) Gait Pattern/deviations: Step-through pattern;Decreased step length - right;Decreased step length - left;Trunk flexed   Gait velocity interpretation: <1.8 ft/sec, indicative of risk for recurrent falls General Gait Details: heavy reliance on walker, slow gait with poor posture - knees and trunk flexed.  Wheel chair follow for safety.   Stairs            Wheelchair Mobility    Modified Rankin (Stroke Patients Only)       Balance Overall balance assessment: Needs assistance Sitting-balance support: Feet supported Sitting balance-Leahy Scale: Fair     Standing balance support: Bilateral upper extremity supported Standing balance-Leahy Scale: Fair                              Cognition Arousal/Alertness: Awake/alert Behavior During Therapy: WFL for tasks assessed/performed Overall Cognitive Status: Within Functional Limits for  tasks assessed                                        Exercises Other Exercises Other Exercises: to commode to void in standing.    General Comments        Pertinent Vitals/Pain Pain Assessment: No/denies pain    Home Living                      Prior Function            PT Goals (current goals can now be found in the care plan section) Progress towards PT goals: Progressing toward goals    Frequency    Min 2X/week      PT Plan      Co-evaluation               AM-PAC PT "6 Clicks" Daily Activity  Outcome Measure  Difficulty turning over in bed (including adjusting bedclothes, sheets and blankets)?: A Lot Difficulty moving from lying on back to sitting on the side of the bed? : A Lot Difficulty sitting down on and standing up from a chair with arms (e.g., wheelchair, bedside commode, etc,.)?: Total Help needed moving to and from a bed to chair (including a wheelchair)?: A Little Help needed walking in hospital room?: A Lot Help needed climbing 3-5 steps with a railing? : A Lot 6 Click Score: 12    End of Session Equipment Utilized During Treatment: Gait belt;Oxygen Activity Tolerance: Patient limited by fatigue Patient left: with bed alarm set;with call bell/phone within reach;with family/visitor present         Time: 0938-1829 PT Time Calculation (min) (ACUTE ONLY): 32 min  Charges:  $Gait Training: 8-22 mins $Therapeutic Activity: 8-22 mins                    G Codes:      Chesley Noon, PTA 11/19/16, 11:34 AM

## 2016-11-19 NOTE — Telephone Encounter (Signed)
Needs the order for IVF sent on 10/27/16 to be refaxed with a diagnosis code on it to [phone]

## 2016-11-19 NOTE — Care Management Important Message (Signed)
Important Message  Patient Details  Name: Frank Cowan MRN: [id number] Date of Birth: 05-21-1940   Medicare Important Message Given:  Yes Signed IM notice given    Katrina Stack, RN 11/19/2016, 8:25 AM

## 2016-11-19 NOTE — Progress Notes (Signed)
Pt. Discharge to SNF via EMS pt. Alert and stable no disocomfort noted.

## 2016-11-19 NOTE — Discharge Instructions (Addendum)
Information on my medicine - ELIQUIS (apixaban)  This medication education was reviewed with me or my healthcare representative as part of my discharge preparation.  The pharmacist that spoke with me during my hospital stay was:  Cheri Guppy, [facility]  Why was Eliquis prescribed for you? Eliquis was prescribed to treat blood clots that may have been found in the veins of your legs (deep vein thrombosis) or in your lungs (pulmonary embolism) and to reduce the risk of them occurring again.  What do You need to know about Eliquis ? The starting dose is 10 mg (two 5 mg tablets) taken TWICE daily for the FIRST SEVEN (7) DAYS, then on August 13th the dose is reduced to ONE 5 mg tablet taken TWICE daily.  Eliquis may be taken with or without food.   Try to take the dose about the same time in the morning and in the evening. If you have difficulty swallowing the tablet whole please discuss with your pharmacist how to take the medication safely.  Take Eliquis exactly as prescribed and DO NOT stop taking Eliquis without talking to the doctor who prescribed the medication.  Stopping may increase your risk of developing a new blood clot.  Refill your prescription before you run out.  After discharge, you should have regular check-up appointments with your healthcare provider that is prescribing your Eliquis.    What do you do if you miss a dose? If a dose of ELIQUIS is not taken at the scheduled time, take it as soon as possible on the same day and twice-daily administration should be resumed. The dose should not be doubled to make up for a missed dose.  Important Safety Information A possible side effect of Eliquis is bleeding. You should call your healthcare provider right away if you experience any of the following: ? Bleeding from an injury or your nose that does not stop. ? Unusual colored urine (red or dark brown) or unusual colored stools (red or black). ? Unusual bruising for unknown  reasons. ? A serious fall or if you hit your head (even if there is no bleeding).  Some medicines may interact with Eliquis and might increase your risk of bleeding or clotting while on Eliquis. To help avoid this, consult your healthcare provider or pharmacist prior to using any new prescription or non-prescription medications, including herbals, vitamins, non-steroidal anti-inflammatory drugs (NSAIDs) and supplements.  This website has more information on Eliquis (apixaban): http://www.eliquis.com/eliquis/home

## 2016-11-19 NOTE — Clinical Social Work Note (Signed)
Pt is ready for discharge today and will return to [facility]. Facility is ready to admit pt as they have received discharge discharge information. Pt and family are aware and agreeable to discharge plan. RN called report. [facility] will provide transportation. CSW is signing off as no further needs identified.   Darden Dates, MSW, LCSW  Clinical Social Worker  [phone]

## 2016-11-19 NOTE — Progress Notes (Signed)
Report telephoned to receiving facility [facility] report given to Thereasa Parkin, LPN. Pt. Daughter at bedside. Nurse will call for transport. Pt alert and satble.

## 2016-11-20 ENCOUNTER — Inpatient Hospital Stay: Payer: Medicare Other

## 2016-11-20 ENCOUNTER — Inpatient Hospital Stay: Payer: Medicare Other | Admitting: Oncology

## 2016-11-21 LAB — CULTURE, BLOOD (ROUTINE X 2)
Culture: NO GROWTH
Culture: NO GROWTH
SPECIAL REQUESTS: ADEQUATE
Special Requests: ADEQUATE

## 2016-11-24 ENCOUNTER — Telehealth: Payer: Self-pay

## 2016-11-24 NOTE — Telephone Encounter (Signed)
Faxed over a request to the office that we sent the original and no word back from the office.

## 2016-11-24 NOTE — Telephone Encounter (Signed)
-----   Message from Betti Cruz, RN sent at 11/20/2016  1:55 PM EDT ----- Regarding: RE: Order Yes Please  ----- Message ----- From: Luretha Murphy, CMA Sent: 11/20/2016   1:34 PM To: Betti Cruz, RN Subject: FW: Order                                      She couldn't find it :( do I need to redo it and fax it?  ----- Message ----- From: Barnett Abu Sent: 11/20/2016   1:27 PM To: Luretha Murphy, CMA Subject: RE: Order                                      I spent all morning looking through the unscanned documents, and there was not anything for this patient.   ----- Message ----- From: Luretha Murphy, CMA Sent: 11/19/2016   1:53 PM To: Barnett Abu Subject: RE: Order                                      It as a hand written order on our letterhead. It was sent to [facility] for IV fluids ----- Message ----- From: Barnett Abu Sent: 11/19/2016  12:02 PM To: Luretha Murphy, CMA Subject: RE: Order                                      Give me more details---who was it sent to, what was the order, etc.  I will have to look through all the backlog after lunch.  ----- Message ----- From: Luretha Murphy, CMA Sent: 11/19/2016  11:21 AM To: Barnett Abu Subject: Order                                          Do you by chance have anything on the patient listed that was a order and placed to be scanned? I faxed it a few weeks ago and they need me to add something to it and refax it to them. I think I put it to be scanned.

## 2016-11-26 ENCOUNTER — Telehealth: Payer: Self-pay | Admitting: *Deleted

## 2016-11-26 NOTE — Telephone Encounter (Signed)
Frank Cowan is going to refax me the original order so I can write the Dx code on there and fax it back to them.

## 2016-11-26 NOTE — Telephone Encounter (Signed)
Faxed this back to [facility] with diagnosis code

## 2016-11-26 NOTE — Telephone Encounter (Signed)
[name] called and reported that patient is in rehab and is unable to walk or hardly hold head up. He is scheduled for a PET scan tomorrow and she would like to have it rescheduled for a few weeks out after he completes rehab to see if he gets his strength back. Please advise if this is alright as she said she does not think he can make the appointment tomorrow

## 2016-11-26 NOTE — Telephone Encounter (Signed)
That's fine.  Thank you.

## 2016-11-27 ENCOUNTER — Ambulatory Visit: Payer: Medicare Other

## 2016-12-02 NOTE — Progress Notes (Deleted)
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  INTERVAL HISTORY: Patient returns to clinic today for further evaluation and reconsideration of cycle 8 of weekly carboplatin and Taxol along with his XRT boost. He is recently admitted to the hospital, but since discharge his performance status has significantly improved. He continues to have chronic weakness and fatigue. His appetite has improved. He does not complain of leg pain today. He continues to be highly anxious. He has no chest pain, shortness of breath, cough, or hemoptysis. He denies any further nausea or vomiting. He denies any diarrhea. He has no urinary complaints. Patient offers no further specific complaints today.  REVIEW OF SYSTEMS:   Review of Systems  Constitutional: Positive for malaise/fatigue. Negative for fever and weight loss.  HENT: Negative.  Negative for sore throat.   Respiratory: Negative.  Negative for cough, hemoptysis and shortness of breath.   Cardiovascular: Negative.  Negative for chest pain and leg swelling.  Gastrointestinal: Positive for constipation. Negative for abdominal pain, blood in stool, heartburn, nausea and vomiting.  Genitourinary: Negative.  Negative for frequency.  Musculoskeletal: Negative for back pain, joint pain and myalgias.  Skin: Negative.  Negative for rash.  Neurological: Positive for weakness. Negative for sensory change and headaches.  Endo/Heme/Allergies: Does not bruise/bleed easily.  Psychiatric/Behavioral: Positive for memory loss. The patient is nervous/anxious.     As per HPI. Otherwise, a complete review of systems is negative.  PAST MEDICAL HISTORY: Past Medical History:  Diagnosis Date  . Cancer ([place])    lung  . Cataract   . Collagen vascular disease ([place])   . History of kidney stones   . Kidney stones 2014  . Squamous cell lung cancer, right ([place]) 05/14/2016    PAST SURGICAL HISTORY: Past Surgical History:  Procedure Laterality Date  . CYSTOSCOPY W/ URETEROSCOPY W/ LITHOTRIPSY    . ENDOBRONCHIAL ULTRASOUND N/A 06/12/2016   Procedure: ENDOBRONCHIAL ULTRASOUND;  Surgeon: Laverle Hobby, MD;  Location: [facility];  Service: Pulmonary;  Laterality: N/A;  . PORTA CATH INSERTION N/A 06/26/2016   Procedure: Glori Luis Cath Insertion;  Surgeon: Algernon Huxley, MD;  Location: [facility];  Service: Cardiovascular;  Laterality: N/A;  . TONSILLECTOMY      FAMILY HISTORY: Family History  Problem Relation Age of Onset  . Heart disease Mother   . Heart disease Father   . Cancer Sister 6       luekemia    ADVANCED DIRECTIVES (Y/N):  N  HEALTH MAINTENANCE: Social History  Substance Use Topics  . Smoking status: Former Smoker    Packs/day: 0.50    Years: 55.00    Quit date: 03/28/2016  . Smokeless tobacco: Former Systems developer  . Alcohol use No     Comment: Not lately     Colonoscopy:  PAP:  Bone density:  Lipid panel:  No Known Allergies  Current Outpatient Prescriptions  Medication Sig Dispense Refill  . acetaminophen (TYLENOL) 500 MG tablet Take 500 mg by mouth every 8 (eight) hours as needed for mild pain or headache.     Marland Kitchen apixaban (ELIQUIS) 5 MG TABS tablet Take 2 tabs twice a day till 11/23/16 and then take 1 tab (5 mg ) twice a day 60 tablet 2  . bisacodyl (  DULCOLAX) 5 MG EC tablet Take 5 mg by mouth daily as needed for moderate constipation.    . ciprofloxacin (CIPRO) 750 MG tablet Take 1 tablet (750 mg total) by mouth 2 (two) times daily. 28 tablet 0  . diclofenac sodium (VOLTAREN) 1 % GEL Apply topically 4 (four) times daily.    Marland Kitchen esomeprazole (NEXIUM) 40 MG capsule Take 1 capsule (40 mg total) by mouth daily. (Patient not taking: Reported on 10/10/2016) 30 capsule 1  .  FLUoxetine (PROZAC) 40 MG capsule Take 40 mg by mouth daily.     . folic acid (FOLVITE) 1 MG tablet Take 1 mg by mouth daily.     Marland Kitchen gabapentin (NEURONTIN) 100 MG capsule Take 100 mg by mouth 3 (three) times daily.    . hydroxychloroquine (PLAQUENIL) 200 MG tablet Take 200 mg by mouth daily.    Marland Kitchen lidocaine (XYLOCAINE) 2 % solution Use as directed 20 mLs in the mouth or throat as needed for mouth pain. (Patient not taking: Reported on 09/25/2016) 100 mL 0  . naproxen sodium (ANAPROX) 220 MG tablet Take 220 mg by mouth 2 (two) times daily as needed. For pain    . oxyCODONE-acetaminophen (PERCOCET/ROXICET) 5-325 MG tablet Take 1-2 tablets by mouth every 6 (six) hours as needed for severe pain. 30 tablet 0  . tamsulosin (FLOMAX) 0.4 MG CAPS capsule Take 0.4 mg by mouth at bedtime.      No current facility-administered medications for this visit.     OBJECTIVE: There were no vitals filed for this visit.   There is no height or weight on file to calculate BMI.    ECOG FS:2 - Symptomatic, <50% confined to bed  General: Well-developed, well-nourished, no acute distress.Sitting in a wheelchair. Eyes: Pink conjunctiva, anicteric sclera. Lungs: Clear to auscultation bilaterally. Heart: Regular rate and rhythm. No rubs, murmurs, or gallops. Abdomen: Soft, nontender, nondistended. No organomegaly noted, normoactive bowel sounds. Musculoskeletal: No edema. Neuro: Alert, answering all questions appropriately. Cranial nerves grossly intact. Skin: No rashes or petechiae noted. Psych: Highly anxious.  LAB RESULTS:  Lab Results  Component Value Date   NA 138 11/17/2016   K 3.9 11/17/2016   CL 104 11/17/2016   CO2 25 11/17/2016   GLUCOSE 159 (H) 11/17/2016   BUN 22 (H) 11/17/2016   CREATININE 0.80 11/17/2016   CALCIUM 8.4 (L) 11/17/2016   PROT 6.0 (L) 09/25/2016   ALBUMIN 3.0 (L) 09/25/2016   AST 59 (H) 09/25/2016   ALT 67 (H) 09/25/2016   ALKPHOS 73 09/25/2016   BILITOT 0.6 09/25/2016    GFRNONAA >60 11/17/2016   GFRAA >60 11/17/2016    Lab Results  Component Value Date   WBC 3.9 11/17/2016   NEUTROABS 6.3 09/25/2016   HGB 11.4 (L) 11/17/2016   HCT 33.6 (L) 11/17/2016   MCV 97.1 11/17/2016   PLT 230 11/17/2016     STUDIES: Dg Chest 2 View  Result Date: 11/16/2016 CLINICAL DATA:  Shortness of breath and patient with a history of lung cancer and hospital discharge for pneumonia 09/13/2016. EXAM: CHEST  2 VIEW COMPARISON:  PA and lateral chest 11/12/2016, 11/11/2016 and 10/09/2016. CT chest 10/29/2016. FINDINGS: Streaky airspace disease is seen in the right lower lobe which is new 10/29/2016 CT scan but appears improved since the 2 most recent plain films. Left upper lobe mass lesion appears unchanged. 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 There small scratch the small left pleural effusion with basilar airspace disease,  unchanged. IMPRESSION: Hazy right lower lobe airspace disease could be due to atelectasis or pneumonia and appears improved since the 2 most recent plain films of the chest. Left upper lobe mass lesion. Chronic left basilar opacity and small effusion are also again seen. Right perihilar radiation change, stable in appearance. Electronically Signed   By: Inge Rise M.D.   On: 11/16/2016 15:40   Ct Angio Chest Pe W Or Wo Contrast  Result Date: 11/16/2016 CLINICAL DATA:  Pt very poor historian, per family pt with increased SOB that worsened today. Family states recently d/c from hospital with pneumonia. Pt also has hx of lung cancer s/p chemo and radiation. EXAM: CT ANGIOGRAPHY CHEST WITH CONTRAST TECHNIQUE: Multidetector CT imaging of the chest was performed using the standard protocol during bolus administration of intravenous contrast. Multiplanar CT image reconstructions and MIPs were obtained to evaluate the vascular anatomy. CONTRAST:  12ml of Isovue 370 used. COMPARISON:  Chest x-ray 11/16/2016, chest CT  10/29/2016 FINDINGS: Cardiovascular: Pulmonary arteries are well opacified by contrast bolus. There is acute pulmonary embolus within the right lower lobe posterior basal segment artery. Acute embolus is also identified within the left lower lobe posterior basal segment branches. There is extensive coronary artery disease. The heart is mildly enlarged. Configuration of the heart is normal. No evidence for right heart strain. Trace pericardial effusion. Dense atherosclerotic calcification of the mitral annulus. There is aortic atherosclerosis without aneurysm. Mediastinum/Nodes: The visualized portion of the thyroid gland has a normal appearance. No significant mediastinal adenopathy. Confluent soft tissue density in the hilar regions limits evaluation of adenopathy. Lungs/Pleura: Within the left lung apex, there is an oval mass, partially obscured by a further consolidation, estimated to measure 2.7 cm, previously 1.9 cm. Cavitary mass in the left upper lobe now measures 4.1 x 3.3 cm, previously 4.8 x 4.6 cm. An adjacent solid nodule is 2.1 x 8 2.7 cm, previously 2.1 x 2.8 cm. Radiation changes again noted in the right upper and lower lobes. There is significant increase in bilateral pleural effusions. There is increased an ground-glass opacity in the right lower lobe and right middle lobe, suspicious for infarct, in the setting of acute embolus. Consolidation in the left upper and lower lobes persists. Upper Abdomen: Partially imaged left renal cyst. Musculoskeletal: Extensive spondylosis of the thoracic spine. No suspicious lytic or blastic lesions are identified. Review of the MIP images confirms the above findings. IMPRESSION: 1. Bilateral pulmonary emboli without evidence for right heart strain. 2. New airspace filling opacities within the right lower lobe and right middle lobe, suspicious for infarcts. 3. Increased bilateral pleural effusions. 4. Persistent significant lung opacities with mixed response to  treatment. Cavitary lesion appears smaller while other lesions appear similar in size or increased in size. 5. Mild cardiomegaly and extensive coronary artery disease. 6.  Aortic Atherosclerosis (ICD10-I70.0). Critical Value/emergent results were called by telephone at the time of interpretation on 11/16/2016 at 7:12 pm to Dr. Delman Kitten , who verbally acknowledged these results. Electronically Signed   By: Nolon Nations M.D.   On: 11/16/2016 19:12   US Venous Img Lower Bilateral  Result Date: 11/17/2016 CLINICAL DATA:  Bilateral pulmonary embolism. EXAM: BILATERAL LOWER EXTREMITY VENOUS DOPPLER ULTRASOUND TECHNIQUE: Gray-scale sonography with graded compression, as well as color Doppler and duplex ultrasound were performed to evaluate the lower extremity deep venous systems from the level of the common femoral vein and including the common femoral, femoral, profunda femoral, popliteal and calf veins including the posterior tibial, peroneal  and gastrocnemius veins when visible. The superficial great saphenous vein was also interrogated. Spectral Doppler was utilized to evaluate flow at rest and with distal augmentation maneuvers in the common femoral, femoral and popliteal veins. COMPARISON:  None. FINDINGS: RIGHT LOWER EXTREMITY Common Femoral Vein: No evidence of thrombus. Normal compressibility, respiratory phasicity and response to augmentation. Saphenofemoral Junction: No evidence of thrombus. Normal compressibility and flow on color Doppler imaging. Profunda Femoral Vein: No evidence of thrombus. Normal compressibility and flow on color Doppler imaging. Femoral Vein: No evidence of thrombus. Normal compressibility, respiratory phasicity and response to augmentation. Popliteal Vein: No evidence of thrombus. Normal compressibility, respiratory phasicity and response to augmentation. Calf Veins: No evidence of thrombus. Normal compressibility and flow on color Doppler imaging. Superficial Great Saphenous  Vein: No evidence of thrombus. Normal compressibility and flow on color Doppler imaging. Venous Reflux:  None. Other Findings:  None. LEFT LOWER EXTREMITY Common Femoral Vein: Nonocclusive thrombus is identified. Saphenofemoral Junction: Nonocclusive thrombus is identified. Profunda Femoral Vein: Occlusive thrombus is present. Femoral Vein: Occlusive thrombus is present Popliteal Vein: Occlusive thrombus is present. Calf Veins: Occlusive thrombus is present. Superficial Great Saphenous Vein: Occlusive thrombus is present. Venous Reflux:  None. Other Findings:  None. IMPRESSION: Extensive deep venous thrombosis in the left leg as described above. Negative for right lower extremity DVT. Electronically Signed   By: Inge Rise M.D.   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  PLAN:    1. 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 MRI the brain is negative. He will finish his XRT boost tomorrow. We will discontinue chemotherapy at this time. We will get a PET scan in 6 weeks and then patient will return in 1-2 days later for further evaluation and consideration maintenance treatment with Durvalumab every 2 weeks for up to one year.  2. Pain: Unclear etiology, but appears to be musculoskeletal in nature. Significantly improved. Continue Percocet as needed. 3. Poor appetite/weight loss: Improved. Appreciate dietary input.  4. 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 5. Constipation: Continue current treatment as prescribed. 6. Pancytopenia: Improving. Discontinue chemotherapy as above.   Cancer Staging Squamous cell lung cancer, left [facility]) Staging form: Lung, AJCC 8th Edition - Clinical stage from 09/24/2016: Stage IIIC (cT3, cN3, cM0) - Signed by Lloyd Huger, MD on  09/24/2016  Squamous cell lung cancer, right [facility]) Staging form: Lung, AJCC 8th Edition - Clinical stage from 06/19/2016: Stage IIIC (cT3, cN3, cM0) - Signed by Lloyd Huger, MD on 06/19/2016    Lloyd Huger, MD 12/02/16 9:52 PM

## 2016-12-03 DIAGNOSIS — I2699 Other pulmonary embolism without acute cor pulmonale: Secondary | ICD-10-CM

## 2016-12-03 DIAGNOSIS — C3490 Malignant neoplasm of unspecified part of unspecified bronchus or lung: Secondary | ICD-10-CM | POA: Diagnosis not present

## 2016-12-03 DIAGNOSIS — I482 Chronic atrial fibrillation: Secondary | ICD-10-CM | POA: Diagnosis not present

## 2016-12-04 ENCOUNTER — Inpatient Hospital Stay: Payer: Medicare Other | Attending: Oncology | Admitting: Oncology

## 2016-12-04 DIAGNOSIS — I2699 Other pulmonary embolism without acute cor pulmonale: Secondary | ICD-10-CM | POA: Diagnosis not present

## 2016-12-04 DIAGNOSIS — I482 Chronic atrial fibrillation: Secondary | ICD-10-CM | POA: Diagnosis not present

## 2016-12-04 DIAGNOSIS — C3490 Malignant neoplasm of unspecified part of unspecified bronchus or lung: Secondary | ICD-10-CM | POA: Diagnosis not present

## 2016-12-05 DIAGNOSIS — C3490 Malignant neoplasm of unspecified part of unspecified bronchus or lung: Secondary | ICD-10-CM | POA: Diagnosis not present

## 2016-12-05 DIAGNOSIS — I482 Chronic atrial fibrillation: Secondary | ICD-10-CM | POA: Diagnosis not present

## 2016-12-05 DIAGNOSIS — I2699 Other pulmonary embolism without acute cor pulmonale: Secondary | ICD-10-CM | POA: Diagnosis not present

## 2016-12-06 DIAGNOSIS — I2699 Other pulmonary embolism without acute cor pulmonale: Secondary | ICD-10-CM | POA: Diagnosis not present

## 2016-12-06 DIAGNOSIS — C3490 Malignant neoplasm of unspecified part of unspecified bronchus or lung: Secondary | ICD-10-CM | POA: Diagnosis not present

## 2016-12-06 DIAGNOSIS — I482 Chronic atrial fibrillation: Secondary | ICD-10-CM | POA: Diagnosis not present

## 2016-12-07 DIAGNOSIS — I482 Chronic atrial fibrillation: Secondary | ICD-10-CM | POA: Diagnosis not present

## 2016-12-07 DIAGNOSIS — C3490 Malignant neoplasm of unspecified part of unspecified bronchus or lung: Secondary | ICD-10-CM | POA: Diagnosis not present

## 2016-12-07 DIAGNOSIS — I2699 Other pulmonary embolism without acute cor pulmonale: Secondary | ICD-10-CM | POA: Diagnosis not present

## 2016-12-08 DIAGNOSIS — C3490 Malignant neoplasm of unspecified part of unspecified bronchus or lung: Secondary | ICD-10-CM | POA: Diagnosis not present

## 2016-12-08 DIAGNOSIS — I482 Chronic atrial fibrillation: Secondary | ICD-10-CM | POA: Diagnosis not present

## 2016-12-08 DIAGNOSIS — I2699 Other pulmonary embolism without acute cor pulmonale: Secondary | ICD-10-CM | POA: Diagnosis not present

## 2016-12-18 ENCOUNTER — Ambulatory Visit
Admission: RE | Admit: 2016-12-18 | Discharge: 2016-12-18 | Disposition: A | Discharge status: Home / Self Care | Payer: Medicare Other | Source: Ambulatory Visit | Attending: Oncology | Admitting: Oncology

## 2016-12-18 DIAGNOSIS — R591 Generalized enlarged lymph nodes: Secondary | ICD-10-CM | POA: Insufficient documentation

## 2016-12-18 DIAGNOSIS — C3492 Malignant neoplasm of unspecified part of left bronchus or lung: Secondary | ICD-10-CM | POA: Diagnosis present

## 2016-12-18 DIAGNOSIS — N281 Cyst of kidney, acquired: Secondary | ICD-10-CM | POA: Diagnosis not present

## 2016-12-18 DIAGNOSIS — J700 Acute pulmonary manifestations due to radiation: Secondary | ICD-10-CM | POA: Insufficient documentation

## 2016-12-18 DIAGNOSIS — I700 Atherosclerosis of aorta: Secondary | ICD-10-CM | POA: Diagnosis not present

## 2016-12-18 DIAGNOSIS — K5730 Diverticulosis of large intestine without perforation or abscess without bleeding: Secondary | ICD-10-CM | POA: Insufficient documentation

## 2016-12-18 DIAGNOSIS — R918 Other nonspecific abnormal finding of lung field: Secondary | ICD-10-CM | POA: Insufficient documentation

## 2016-12-18 DIAGNOSIS — J90 Pleural effusion, not elsewhere classified: Secondary | ICD-10-CM | POA: Insufficient documentation

## 2016-12-18 LAB — GLUCOSE, CAPILLARY: Glucose-Capillary: 88 mg/dL (ref 65–99)

## 2016-12-18 MED ORDER — FLUDEOXYGLUCOSE F - 18 (FDG) INJECTION
12.6300 | Freq: Once | INTRAVENOUS | Status: AC | PRN
  Administered 2016-12-18: 12.63 via INTRAVENOUS

## 2016-12-23 ENCOUNTER — Other Ambulatory Visit: Payer: Self-pay | Admitting: *Deleted

## 2016-12-23 ENCOUNTER — Telehealth: Payer: Self-pay | Admitting: *Deleted

## 2016-12-23 DIAGNOSIS — Z978 Presence of other specified devices: Secondary | ICD-10-CM

## 2016-12-23 DIAGNOSIS — Z960 Presence of urogenital implants: Principal | ICD-10-CM

## 2016-12-23 NOTE — Telephone Encounter (Signed)
I have attempted to call [name] with results of PET scan and update for further treatment planning, no answer and vm is full.

## 2016-12-27 NOTE — Progress Notes (Deleted)
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  INTERVAL HISTORY: Patient returns to clinic today for further evaluation and reconsideration of cycle 8 of weekly carboplatin and Taxol along with his XRT boost. He is recently admitted to the hospital, but since discharge his performance status has significantly improved. He continues to have chronic weakness and fatigue. His appetite has improved. He does not complain of leg pain today. He continues to be highly anxious. He has no chest pain, shortness of breath, cough, or hemoptysis. He denies any further nausea or vomiting. He denies any diarrhea. He has no urinary complaints. Patient offers no further specific complaints today.  REVIEW OF SYSTEMS:   Review of Systems  Constitutional: Positive for malaise/fatigue. Negative for fever and weight loss.  HENT: Negative.  Negative for sore throat.   Respiratory: Negative.  Negative for cough, hemoptysis and shortness of breath.   Cardiovascular: Negative.  Negative for chest pain and leg swelling.  Gastrointestinal: Positive for constipation. Negative for abdominal pain, blood in stool, heartburn, nausea and vomiting.  Genitourinary: Negative.  Negative for frequency.  Musculoskeletal: Negative for back pain, joint pain and myalgias.  Skin: Negative.  Negative for rash.  Neurological: Positive for weakness. Negative for sensory change and headaches.  Endo/Heme/Allergies: Does not bruise/bleed easily.  Psychiatric/Behavioral: Positive for memory loss. The patient is nervous/anxious.     As per HPI. Otherwise, a complete review of systems is negative.  PAST MEDICAL HISTORY: Past Medical History:  Diagnosis Date  . Cancer ([place])    lung  . Cataract   . Collagen vascular disease ([place])   . History of kidney stones   . Kidney stones 2014  . Squamous cell lung cancer, right ([place]) 05/14/2016    PAST SURGICAL HISTORY: Past Surgical History:  Procedure Laterality Date  . CYSTOSCOPY W/ URETEROSCOPY W/ LITHOTRIPSY    . ENDOBRONCHIAL ULTRASOUND N/A 06/12/2016   Procedure: ENDOBRONCHIAL ULTRASOUND;  Surgeon: Laverle Hobby, MD;  Location: [facility] ORS;  Service: Pulmonary;  Laterality: N/A;  . PORTA CATH INSERTION N/A 06/26/2016   Procedure: Glori Luis Cath Insertion;  Surgeon: Algernon Huxley, MD;  Location: [place] [facility];  Service: Cardiovascular;  Laterality: N/A;  . TONSILLECTOMY      FAMILY HISTORY: Family History  Problem Relation Age of Onset  . Heart disease Mother   . Heart disease Father   . Cancer Sister 6       luekemia    ADVANCED DIRECTIVES (Y/N):  N  HEALTH MAINTENANCE: Social History  Substance Use Topics  . Smoking status: Former Smoker    Packs/day: 0.50    Years: 55.00    Quit date: 03/28/2016  . Smokeless tobacco: Former Systems developer  . Alcohol use No     Comment: Not lately     Colonoscopy:  PAP:  Bone density:  Lipid panel:  No Known Allergies  Current Outpatient Prescriptions  Medication Sig Dispense Refill  . acetaminophen (TYLENOL) 500 MG tablet Take 500 mg by mouth every 8 (eight) hours as needed for mild pain or headache.     Marland Kitchen apixaban (ELIQUIS) 5 MG TABS tablet Take 2 tabs twice a day till 11/23/16 and then take 1 tab (5 mg ) twice a day 60 tablet 2  . bisacodyl (  DULCOLAX) 5 MG EC tablet Take 5 mg by mouth daily as needed for moderate constipation.    . ciprofloxacin (CIPRO) 750 MG tablet Take 1 tablet (750 mg total) by mouth 2 (two) times daily. 28 tablet 0  . diclofenac sodium (VOLTAREN) 1 % GEL Apply topically 4 (four) times daily.    Marland Kitchen esomeprazole (NEXIUM) 40 MG capsule Take 1 capsule (40 mg total) by mouth daily. (Patient not taking: Reported on 10/10/2016) 30 capsule 1  .  FLUoxetine (PROZAC) 40 MG capsule Take 40 mg by mouth daily.     . folic acid (FOLVITE) 1 MG tablet Take 1 mg by mouth daily.     Marland Kitchen gabapentin (NEURONTIN) 100 MG capsule Take 100 mg by mouth 3 (three) times daily.    . hydroxychloroquine (PLAQUENIL) 200 MG tablet Take 200 mg by mouth daily.    Marland Kitchen lidocaine (XYLOCAINE) 2 % solution Use as directed 20 mLs in the mouth or throat as needed for mouth pain. (Patient not taking: Reported on 09/25/2016) 100 mL 0  . naproxen sodium (ANAPROX) 220 MG tablet Take 220 mg by mouth 2 (two) times daily as needed. For pain    . oxyCODONE-acetaminophen (PERCOCET/ROXICET) 5-325 MG tablet Take 1-2 tablets by mouth every 6 (six) hours as needed for severe pain. 30 tablet 0  . tamsulosin (FLOMAX) 0.4 MG CAPS capsule Take 0.4 mg by mouth at bedtime.      No current facility-administered medications for this visit.     OBJECTIVE: There were no vitals filed for this visit.   There is no height or weight on file to calculate BMI.    ECOG FS:2 - Symptomatic, <50% confined to bed  General: Well-developed, well-nourished, no acute distress.Sitting in a wheelchair. Eyes: Pink conjunctiva, anicteric sclera. Lungs: Clear to auscultation bilaterally. Heart: Regular rate and rhythm. No rubs, murmurs, or gallops. Abdomen: Soft, nontender, nondistended. No organomegaly noted, normoactive bowel sounds. Musculoskeletal: No edema. Neuro: Alert, answering all questions appropriately. Cranial nerves grossly intact. Skin: No rashes or petechiae noted. Psych: Highly anxious.  LAB RESULTS:  Lab Results  Component Value Date   NA 138 11/17/2016   K 3.9 11/17/2016   CL 104 11/17/2016   CO2 25 11/17/2016   GLUCOSE 159 (H) 11/17/2016   BUN 22 (H) 11/17/2016   CREATININE 0.80 11/17/2016   CALCIUM 8.4 (L) 11/17/2016   PROT 6.0 (L) 09/25/2016   ALBUMIN 3.0 (L) 09/25/2016   AST 59 (H) 09/25/2016   ALT 67 (H) 09/25/2016   ALKPHOS 73 09/25/2016   BILITOT 0.6 09/25/2016    GFRNONAA >60 11/17/2016   GFRAA >60 11/17/2016    Lab Results  Component Value Date   WBC 3.9 11/17/2016   NEUTROABS 6.3 09/25/2016   HGB 11.4 (L) 11/17/2016   HCT 33.6 (L) 11/17/2016   MCV 97.1 11/17/2016   PLT 230 11/17/2016     STUDIES: Nm Pet Image Restag (ps) Skull Base To Thigh  Result Date: 12/18/2016 CLINICAL DATA:  Subsequent treatment strategy for left squamous cell lung cancer. Prior chemo radiation December 2017. EXAM: NUCLEAR MEDICINE PET SKULL BASE TO THIGH TECHNIQUE: 12.6 mCi F-18 FDG was injected intravenously. Full-ring PET imaging was performed from the skull base to thigh after the radiotracer. CT data was obtained and used for attenuation correction and anatomic localization. FASTING BLOOD GLUCOSE:  Value: 88 mg/dl COMPARISON:  Multiple exams, including CT chest from 12/03/2016 and PET-CT from 05/20/2016 FINDINGS: NECK No hypermetabolic lymph nodes in the neck. Chronic ischemic microvascular  white matter disease intracranially. CHEST Indistinct left upper lobe airspace opacity partially corresponding to the site of the mass shown on prior PET-CT, upper portion measuring 7.0 by 5.7 cm and lower portion with slightly different morphology measuring 7.0 by 5.1 cm. Maximum SUV 4.5, formerly 21.3. The overall size of the lesion is larger than before but much of this is probably due to radiation pneumonitis surrounding the original lesion. The prior paratracheal and AP window adenopathy as well as the right hilar adenopathy have been effectively treated. For example the previously 1.4 cm right paratracheal lymph node currently measures about 0.6 cm, with current SUV 2.5 similar to background mediastinal activity, and prior SUV 14.9. Similarly the prior right hilar pathologic and hypermetabolic lymph node a similar to background mediastinal activity, although there is right perihilar and paramediastinal airspace opacity likely from the radiation pneumonitis. The bilateral moderate  pleural effusions with passive atelectasis. Mild cardiomegaly. Coronary, aortic arch, and branch vessel atherosclerotic vascular disease. Emphysema. There is patchy ground-glass opacity in the right middle lobe and right lower lobe. ABDOMEN/PELVIS No abnormal hypermetabolic activity within the liver, pancreas, adrenal glands, or spleen. No hypermetabolic lymph nodes in the abdomen or pelvis. Aortoiliac atherosclerotic vascular disease. Left renal new hypodense lesions appear photopenic and most compatible with cysts. Some of these lesions in the left kidney lower pole have some complex elements. Sigmoid colon diverticulosis. Foley catheter in the urinary bladder. SKELETON No focal hypermetabolic activity to suggest skeletal metastasis. Vague activity along the right posterolateral and left apical chest wall is likely muscular or physiologic. IMPRESSION: 1. Compared to the prior PET-CT, there has been interval effective therapy of the left upper lobe mass and mediastinal and right hilar adenopathy. 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 Activity in the original primary mass is reduced to maximum SUV 4.5 (formerly 21.3) and for example in the right paratracheal region SUV has decreased from prior 14.9 to current 2.5. 2. No new metastatic lesions. 3. Moderate bilateral pleural effusions. Ground-glass opacities in the right lung could be from alveolitis, asymmetric edema, or a response to prior radiation therapy. 4. 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 Aortic Atherosclerosis (ICD10-I70.0). Stable left renal cysts, at least one of which is complex along the left kidney lower pole, but with associated photopenia. Sigmoid colon diverticulosis. Electronically Signed   By: Van Clines M.D.   On: 12/18/2016 14:25    ASSESSMENT: Clinical stage IIIc  squamous cell carcinoma of the upper lobe of left lung.  PLAN:    1. 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 MRI the brain is negative. He will finish his XRT boost tomorrow. We will discontinue chemotherapy at this time. We will get a PET scan in 6 weeks and then patient will return in 1-2 days later for further evaluation and consideration maintenance treatment with Durvalumab every 2 weeks for up to one year.  2. Pain: Unclear etiology, but appears to be musculoskeletal in nature. Significantly improved. Continue Percocet as needed. 3. Poor appetite/weight loss: Improved. Appreciate dietary input.  4. 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 5. Constipation: Continue current treatment as prescribed. 6. Pancytopenia: Improving. Discontinue chemotherapy as above.   Cancer Staging Squamous cell lung cancer, left ([place]) Staging form: Lung, AJCC 8th Edition -  Clinical stage from 09/24/2016: Stage IIIC (cT3, cN3, cM0) - Signed by Lloyd Huger, MD on 09/24/2016  Squamous cell lung cancer, right [facility]) Staging form: Lung, AJCC 8th Edition - Clinical stage from 06/19/2016: Stage IIIC (cT3, cN3, cM0) - Signed by Lloyd Huger, MD on 06/19/2016    Lloyd Huger, MD 12/27/16 6:34 PM

## 2016-12-28 ENCOUNTER — Inpatient Hospital Stay
Admission: AD | Admit: 2016-12-28 | Discharge: 2017-01-01 | DRG: 314 | Disposition: A | Discharge status: Skilled Nursing Facility | Payer: Medicare Other | Source: Other Acute Inpatient Hospital | Attending: Internal Medicine | Admitting: Internal Medicine

## 2016-12-28 DIAGNOSIS — N401 Enlarged prostate with lower urinary tract symptoms: Secondary | ICD-10-CM | POA: Diagnosis present

## 2016-12-28 DIAGNOSIS — Z8249 Family history of ischemic heart disease and other diseases of the circulatory system: Secondary | ICD-10-CM | POA: Diagnosis not present

## 2016-12-28 DIAGNOSIS — Z9221 Personal history of antineoplastic chemotherapy: Secondary | ICD-10-CM

## 2016-12-28 DIAGNOSIS — R338 Other retention of urine: Secondary | ICD-10-CM | POA: Diagnosis present

## 2016-12-28 DIAGNOSIS — Z85828 Personal history of other malignant neoplasm of skin: Secondary | ICD-10-CM

## 2016-12-28 DIAGNOSIS — Z7901 Long term (current) use of anticoagulants: Secondary | ICD-10-CM | POA: Diagnosis not present

## 2016-12-28 DIAGNOSIS — Y848 Other medical procedures as the cause of abnormal reaction of the patient, or of later complication, without mention of misadventure at the time of the procedure: Secondary | ICD-10-CM | POA: Diagnosis present

## 2016-12-28 DIAGNOSIS — B9562 Methicillin resistant Staphylococcus aureus infection as the cause of diseases classified elsewhere: Secondary | ICD-10-CM | POA: Diagnosis present

## 2016-12-28 DIAGNOSIS — L89153 Pressure ulcer of sacral region, stage 3: Secondary | ICD-10-CM | POA: Diagnosis present

## 2016-12-28 DIAGNOSIS — R7881 Bacteremia: Secondary | ICD-10-CM

## 2016-12-28 DIAGNOSIS — Z7189 Other specified counseling: Secondary | ICD-10-CM | POA: Diagnosis not present

## 2016-12-28 DIAGNOSIS — Z7952 Long term (current) use of systemic steroids: Secondary | ICD-10-CM | POA: Diagnosis not present

## 2016-12-28 DIAGNOSIS — Z806 Family history of leukemia: Secondary | ICD-10-CM | POA: Diagnosis not present

## 2016-12-28 DIAGNOSIS — N390 Urinary tract infection, site not specified: Secondary | ICD-10-CM | POA: Diagnosis present

## 2016-12-28 DIAGNOSIS — Z86711 Personal history of pulmonary embolism: Secondary | ICD-10-CM | POA: Diagnosis not present

## 2016-12-28 DIAGNOSIS — Z923 Personal history of irradiation: Secondary | ICD-10-CM | POA: Diagnosis not present

## 2016-12-28 DIAGNOSIS — Z825 Family history of asthma and other chronic lower respiratory diseases: Secondary | ICD-10-CM | POA: Diagnosis not present

## 2016-12-28 DIAGNOSIS — T83511A Infection and inflammatory reaction due to indwelling urethral catheter, initial encounter: Secondary | ICD-10-CM | POA: Diagnosis present

## 2016-12-28 DIAGNOSIS — Z515 Encounter for palliative care: Secondary | ICD-10-CM | POA: Diagnosis not present

## 2016-12-28 DIAGNOSIS — Z79899 Other long term (current) drug therapy: Secondary | ICD-10-CM | POA: Diagnosis not present

## 2016-12-28 DIAGNOSIS — C3492 Malignant neoplasm of unspecified part of left bronchus or lung: Secondary | ICD-10-CM | POA: Diagnosis not present

## 2016-12-28 DIAGNOSIS — C3490 Malignant neoplasm of unspecified part of unspecified bronchus or lung: Secondary | ICD-10-CM | POA: Diagnosis not present

## 2016-12-28 DIAGNOSIS — Z87891 Personal history of nicotine dependence: Secondary | ICD-10-CM | POA: Diagnosis not present

## 2016-12-28 DIAGNOSIS — I4891 Unspecified atrial fibrillation: Secondary | ICD-10-CM | POA: Diagnosis present

## 2016-12-28 DIAGNOSIS — C3412 Malignant neoplasm of upper lobe, left bronchus or lung: Secondary | ICD-10-CM | POA: Diagnosis present

## 2016-12-28 DIAGNOSIS — T80211A Bloodstream infection due to central venous catheter, initial encounter: Secondary | ICD-10-CM | POA: Diagnosis present

## 2016-12-28 DIAGNOSIS — Z87442 Personal history of urinary calculi: Secondary | ICD-10-CM | POA: Diagnosis not present

## 2016-12-28 DIAGNOSIS — B9689 Other specified bacterial agents as the cause of diseases classified elsewhere: Secondary | ICD-10-CM | POA: Diagnosis present

## 2016-12-28 HISTORY — DX: Bacteremia: R78.81

## 2016-12-28 HISTORY — DX: Other pulmonary embolism without acute cor pulmonale: I26.99

## 2016-12-28 HISTORY — DX: Unspecified atrial fibrillation: I48.91

## 2016-12-28 LAB — BASIC METABOLIC PANEL
ANION GAP: 5 (ref 5–15)
BUN: 9 mg/dL (ref 6–20)
CALCIUM: 8.2 mg/dL — AB (ref 8.9–10.3)
CHLORIDE: 105 mmol/L (ref 101–111)
CO2: 27 mmol/L (ref 22–32)
Creatinine, Ser: 0.46 mg/dL — ABNORMAL LOW (ref 0.61–1.24)
GFR calc Af Amer: >60 mL/min (ref >60)
GFR calc non Af Amer: >60 mL/min (ref >60)
GLUCOSE: 97 mg/dL (ref 65–99)
Potassium: 3.9 mmol/L (ref 3.5–5.1)
Sodium: 137 mmol/L (ref 135–145)

## 2016-12-28 LAB — CBC
HEMATOCRIT: 28.5 % — AB (ref 40.0–52.0)
Hemoglobin: 9.5 g/dL — ABNORMAL LOW (ref 13.0–18.0)
MCH: 30.7 pg (ref 26.0–34.0)
MCHC: 33.4 g/dL (ref 32.0–36.0)
MCV: 92 fL (ref 80.0–100.0)
Platelets: 161 10*3/uL (ref 150–440)
RBC: 3.1 MIL/uL — ABNORMAL LOW (ref 4.40–5.90)
RDW: 15 % — AB (ref 11.5–14.5)
WBC: 6.1 10*3/uL (ref 3.8–10.6)

## 2016-12-28 LAB — VANCOMYCIN, RANDOM
Vancomycin Rm: 26
Vancomycin Rm: 21

## 2016-12-28 MED ORDER — DOCUSATE SODIUM 100 MG PO CAPS: 100.0000 mg | ORAL_CAPSULE | Freq: Two times a day (BID) | ORAL | Status: DC | PRN

## 2016-12-28 MED ORDER — FLUOXETINE HCL 20 MG PO CAPS
40.0000 mg | ORAL_CAPSULE | Freq: Every day | ORAL | Status: DC
  Administered 2016-12-29 – 2017-01-01 (×3): 40 mg via ORAL
  Filled 2016-12-28 (×4): qty 2

## 2016-12-28 MED ORDER — OXYCODONE-ACETAMINOPHEN 5-325 MG PO TABS: 1.0000 | ORAL_TABLET | Freq: Four times a day (QID) | ORAL | Status: DC | PRN

## 2016-12-28 MED ORDER — HYDROXYCHLOROQUINE SULFATE 200 MG PO TABS
200.0000 mg | ORAL_TABLET | Freq: Every day | ORAL | Status: DC
  Administered 2016-12-29 – 2017-01-01 (×3): 200 mg via ORAL
  Filled 2016-12-28 (×4): qty 1

## 2016-12-28 MED ORDER — DILTIAZEM HCL ER COATED BEADS 120 MG PO CP24
120.0000 mg | ORAL_CAPSULE | Freq: Every day | ORAL | Status: DC
  Administered 2016-12-29 – 2017-01-01 (×3): 120 mg via ORAL
  Filled 2016-12-28 (×5): qty 1

## 2016-12-28 MED ORDER — CEFDINIR 125 MG/5ML PO SUSR
300.0000 mg | Freq: Two times a day (BID) | ORAL | Status: DC
  Administered 2016-12-28 – 2017-01-01 (×6): 300 mg via ORAL
  Filled 2016-12-28 (×8): qty 15

## 2016-12-28 MED ORDER — BISACODYL 5 MG PO TBEC: 5.0000 mg | DELAYED_RELEASE_TABLET | Freq: Every day | ORAL | Status: DC | PRN

## 2016-12-28 MED ORDER — APIXABAN 5 MG PO TABS
5.0000 mg | ORAL_TABLET | Freq: Two times a day (BID) | ORAL | Status: DC
  Administered 2016-12-28 – 2016-12-29 (×3): 5 mg via ORAL
  Filled 2016-12-28 (×3): qty 1

## 2016-12-28 MED ORDER — TAMSULOSIN HCL 0.4 MG PO CAPS
0.4000 mg | ORAL_CAPSULE | Freq: Every day | ORAL | Status: DC
  Administered 2016-12-28 – 2016-12-31 (×4): 0.4 mg via ORAL
  Filled 2016-12-28 (×4): qty 1

## 2016-12-28 MED ORDER — NAPROXEN 250 MG PO TABS
250.0000 mg | ORAL_TABLET | Freq: Two times a day (BID) | ORAL | Status: DC | PRN
  Filled 2016-12-28: qty 1

## 2016-12-28 MED ORDER — GABAPENTIN 100 MG PO CAPS
100.0000 mg | ORAL_CAPSULE | Freq: Three times a day (TID) | ORAL | Status: DC
  Administered 2016-12-28 – 2017-01-01 (×7): 100 mg via ORAL
  Filled 2016-12-28 (×7): qty 1

## 2016-12-28 MED ORDER — FLUOXETINE HCL 20 MG PO CAPS: 20.0000 mg | ORAL_CAPSULE | Freq: Every day | ORAL | Status: DC

## 2016-12-28 MED ORDER — CLONAZEPAM 0.5 MG PO TABS
0.5000 mg | ORAL_TABLET | Freq: Two times a day (BID) | ORAL | Status: DC
  Administered 2016-12-28 – 2017-01-01 (×7): 0.5 mg via ORAL
  Filled 2016-12-28 (×7): qty 1

## 2016-12-28 MED ORDER — VANCOMYCIN HCL 10 G IV SOLR
1500.0000 mg | INTRAVENOUS | Status: DC
  Administered 2016-12-29: 1500 mg via INTRAVENOUS
  Filled 2016-12-28 (×2): qty 1500

## 2016-12-28 NOTE — Progress Notes (Signed)
Pharmacy Antibiotic Note  Frank Cowan is a 76 y.o. male admitted on 12/28/2016 with MRSA bacteremia and PICC line infection.  Pharmacy has been consulted for Vancomycin dosing.  ** Patient was a direct admission from [facility]. According to discharge documents on Care Everywhere- patient was receiving Vancomycin 1500mg  IV every 12 hours. No documentation of last dose given. There is a trough level of 10 on 9/15, suggesting Vancomycin 1500 every 12 hours is a dose increase from initial regimen started on 9/13**   Plan: Based on the  2 vancomycin  random levels draw today, will adjust patients vancomycin regimen.   Estimated Kinetics:  Ke: 0.073   T1/2: 9.5   Vd:55    Calculated Kinetics Based on Random levels: Ke: 0.043   T1/2: 16hr  Will start patient on Vancomycin 1500mg  IV every 18 hours. Trough ordered prior to 3rd dose to verify appropriate levels/dose, and can intervene early if dose not adequate. Will monitor renal function and adjust dose as needed.   Height: 6' (182.9 cm) Weight: 171 lb 3.2 oz (77.7 kg) IBW/kg (Calculated) : 77.6  Temp (24hrs), Avg:98.1 F (36.7 C), Min:98 F (36.7 C), Max:98.1 F (36.7 C)   Recent Labs Lab 12/28/16 1626 12/28/16 2129  WBC 6.1  --   CREATININE 0.46*  --   VANCORANDOM 26 21    Estimated Creatinine Clearance: 83.5 mL/min (A) (by C-G formula based on SCr of 0.46 mg/dL (L)).    No Known Allergies  Antimicrobials this admission: 9/13 vancomycin  >>  Dose adjustments this admission:   Microbiology results: 9/13 BCx: MRSA  9/13 UCx: Acinetobacter   Thank you for allowing pharmacy to be a part of this patient's care.  Pernell Dupre, PharmD, BCPS Clinical Pharmacist 12/28/2016 11:29 PM

## 2016-12-28 NOTE — Progress Notes (Signed)
Pt and family updated on current plan of care. ID, Cardiology, & Urology consults pending.  A Fib on telemetry.  Blood cultures drawn. IV Vancomycin to be started this evening.  NPO at midnight in case pt needs to have a procedure tomorrow depending on consults.

## 2016-12-28 NOTE — Progress Notes (Signed)
On call MD, Dr. Anselm Jungling notified pt arrived by EMS from [facility]. Report received by Jeannie Fend RN [phone]). VSS. Foley catheter in place on arrival (pt daughter states was placed by a urologist over a month ago). Stage 3 on sacrum covered by foam protector present on admission. Small abrasions on right shin. Generalized bruises. Family at bedside.

## 2016-12-28 NOTE — H&P (Addendum)
[place] [facility] [place] NAME: Fielding Mault    MR#:  [id number]  DATE OF BIRTH:  [birth year]  DATE OF ADMISSION:  12/28/2016  PRIMARY CARE PHYSICIAN: Claiborne Billings, MD   REQUESTING/REFERRING PHYSICIAN: Transfer from [facility].  CHIEF COMPLAINT:  No chief complaint on file.   HISTORY OF PRESENT ILLNESS: Curran Lenderman  is a 76 y.o. male with a known history of A fib, PE, Skin Cancer, Kidney stone- Sen to rehab after last month adm here. Had aspiration pneumonia 3 weeks ago, sent to [facility] ( as it is nearest hospital from rehab)- had PICC line for Abx for one week and sent to rehab- but picc line never removed.had fever 3 days ago- with some chest pain- sent to hospital- found to have bacteremia- PICC removed and sent for Cx- found to have MRSA. They spoke to [facility] ID- they suggested to have TEE and may need to removed medport. They don;t have these fcilities at [facility], and pt's initial care was here, family preferred to be transferred here. Pt also have indwelling foley cath for last one month due to retention.  PAST MEDICAL HISTORY:   Past Medical History:  Diagnosis Date  . Atrial fibrillation ([place])   . Bacteremia   . Cancer ([place])    lung  . Cataract   . Collagen vascular disease ([place])   . History of kidney stones   . Kidney stones 2014  . Pulmonary emboli ([place])   . Squamous cell lung cancer, right ([place]) 05/14/2016    PAST SURGICAL HISTORY:  Past Surgical History:  Procedure Laterality Date  . CYSTOSCOPY W/ URETEROSCOPY W/ LITHOTRIPSY    . ENDOBRONCHIAL ULTRASOUND N/A 06/12/2016   Procedure: ENDOBRONCHIAL ULTRASOUND;  Surgeon: Laverle Hobby, MD;  Location: [facility] ORS;  Service: Pulmonary;  Laterality: N/A;  . PORTA CATH INSERTION N/A 06/26/2016   Procedure: Glori Luis Cath Insertion;  Surgeon: Algernon Huxley, MD;  Location: [place] [facility];  Service: Cardiovascular;  Laterality: N/A;  . TONSILLECTOMY      SOCIAL HISTORY:   Social History  Substance Use Topics  . Smoking status: Former Smoker    Packs/day: 0.50    Years: 55.00    Quit date: 03/28/2016  . Smokeless tobacco: Former Systems developer  . Alcohol use No     Comment: Not lately    FAMILY HISTORY:  Family History  Problem Relation Age of Onset  . Heart disease Mother   . Heart disease Father   . Cancer Sister 6       luekemia    DRUG ALLERGIES: No Known Allergies  REVIEW OF SYSTEMS:   CONSTITUTIONAL: No fever,positive for fatigue or weakness.  EYES: No blurred or double vision.  EARS, NOSE, AND THROAT: No tinnitus or ear pain.  RESPIRATORY: No cough, shortness of breath, wheezing or hemoptysis.  CARDIOVASCULAR: No chest pain, orthopnea, edema.  GASTROINTESTINAL: No nausea, vomiting, diarrhea or abdominal pain.  GENITOURINARY: No dysuria, hematuria.  ENDOCRINE: No polyuria, nocturia,  HEMATOLOGY: No anemia, easy bruising or bleeding SKIN: No rash or lesion. MUSCULOSKELETAL: No joint pain or arthritis.   NEUROLOGIC: No tingling, numbness, weakness.  PSYCHIATRY: No anxiety or depression.   MEDICATIONS AT HOME:  Prior to Admission medications   Medication Sig Start Date End Date Taking? Authorizing Provider  acetaminophen (TYLENOL) 500 MG tablet Take 500 mg by mouth every 8 (eight) hours as needed for mild pain or headache.    Yes [provider]  apixaban (  ELIQUIS) 5 MG TABS tablet Take 2 tabs twice a day till 11/23/16 and then take 1 tab (5 mg ) twice a day 11/19/16  Yes Fritzi Mandes, MD  bisacodyl (DULCOLAX) 5 MG EC tablet Take 5 mg by mouth daily as needed for moderate constipation.   Yes [provider]  clonazePAM (KLONOPIN) 0.5 MG tablet Take 0.5 mg by mouth 2 (two) times daily.   Yes [provider]  diltiazem (CARDIZEM) 30 MG tablet Take 1 tablet by mouth daily. 12/25/16 12/25/17 Yes [provider]  FLUoxetine (PROZAC) 40 MG capsule Take 40 mg by mouth daily.    Yes [provider]  folic acid  (FOLVITE) 1 MG tablet Take 1 mg by mouth daily.  09/13/13  Yes [provider]  gabapentin (NEURONTIN) 100 MG capsule Take 100 mg by mouth 3 (three) times daily.   Yes [provider]  hydroxychloroquine (PLAQUENIL) 200 MG tablet Take 200 mg by mouth daily.   Yes [provider]  Multiple Vitamins-Minerals (DECUBI-VITE) CAPS Take 1 tablet by mouth daily.   Yes [provider]  oxyCODONE-acetaminophen (PERCOCET/ROXICET) 5-325 MG tablet Take 1-2 tablets by mouth every 6 (six) hours as needed for severe pain. 09/02/16  Yes Lloyd Huger, MD  potassium chloride SA (K-DUR,KLOR-CON) 20 MEQ tablet Take 1 tablet by mouth 2 (two) times daily.   Yes [provider]  tamsulosin (FLOMAX) 0.4 MG CAPS capsule Take 0.4 mg by mouth at bedtime.    Yes [provider]  cefdinir (OMNICEF) 300 MG capsule Take 2 capsules by mouth daily. 12/25/16   [provider]  ciprofloxacin (CIPRO) 750 MG tablet Take 1 tablet (750 mg total) by mouth 2 (two) times daily. Patient not taking: Reported on 12/28/2016 11/19/16   Fritzi Mandes, MD  dexamethasone (DECADRON) 4 MG tablet Take 1 tablet by mouth daily. 09/30/16   [provider]  diclofenac sodium (VOLTAREN) 1 % GEL Apply topically 4 (four) times daily.    [provider]  esomeprazole (NEXIUM) 40 MG capsule Take 1 capsule (40 mg total) by mouth daily. Patient not taking: Reported on 10/10/2016 08/02/16 08/02/17  Earleen Newport, MD  FLUoxetine (PROZAC) 20 MG capsule Take 20 mg by mouth daily. 10/17/16   [provider]  lidocaine (XYLOCAINE) 2 % solution Use as directed 20 mLs in the mouth or throat as needed for mouth pain. Patient not taking: Reported on 09/25/2016 08/02/16   Earleen Newport, MD  methotrexate 2.5 MG tablet Take 20 mg by mouth once a week. 10/17/16   [provider]  naproxen sodium (ANAPROX) 220 MG tablet Take 220 mg by mouth 2 (two) times daily as needed. For  pain    [provider]      PHYSICAL EXAMINATION:   VITAL SIGNS: Blood pressure 117/67, pulse 74, temperature 98.1 F (36.7 C), temperature source Oral, resp. rate 20, height 6' (1.829 m), weight 77.7 kg (171 lb 3.2 oz), SpO2 98 %.  GENERAL:  76 y.o.-year-old patient lying in the bed with no acute distress.  EYES: Pupils equal, round, reactive to light and accommodation. No scleral icterus. Extraocular muscles intact.  HEENT: Head atraumatic, normocephalic. Oropharynx and nasopharynx clear.  NECK:  Supple, no jugular venous distention. No thyroid enlargement, no tenderness.  LUNGS: Normal breath sounds bilaterally, no wheezing, rales,rhonchi or crepitation. No use of accessory muscles of respiration.  CARDIOVASCULAR: S1, S2 normal. No murmurs, rubs, or gallops.  ABDOMEN: Soft, nontender, nondistended. Bowel sounds present.  No organomegaly or mass.  EXTREMITIES: No pedal edema, cyanosis, or clubbing.  NEUROLOGIC: Cranial nerves II through XII are intact. Muscle strength 3-4/5 in all extremities. Sensation intact. Gait not checked.  PSYCHIATRIC: The patient is alert and oriented x 3.  SKIN: No obvious rash, lesion, or ulcer.   LABORATORY PANEL:   CBC No results for input(s): WBC, HGB, HCT, PLT, MCV, MCH, MCHC, RDW, LYMPHSABS, MONOABS, EOSABS, BASOSABS, BANDABS in the last 168 hours.  Invalid input(s): NEUTRABS, BANDSABD ------------------------------------------------------------------------------------------------------------------  Chemistries  No results for input(s): NA, K, CL, CO2, GLUCOSE, BUN, CREATININE, CALCIUM, MG, AST, ALT, ALKPHOS, BILITOT in the last 168 hours.  Invalid input(s): GFRCGP ------------------------------------------------------------------------------------------------------------------ CrCl cannot be calculated (Patient's most recent lab result is older than the maximum 21 days  allowed.). ------------------------------------------------------------------------------------------------------------------ No results for input(s): TSH, T4TOTAL, T3FREE, THYROIDAB in the last 72 hours.  Invalid input(s): FREET3   Coagulation profile No results for input(s): INR, PROTIME in the last 168 hours. ------------------------------------------------------------------------------------------------------------------- No results for input(s): DDIMER in the last 72 hours. -------------------------------------------------------------------------------------------------------------------  Cardiac Enzymes No results for input(s): CKMB, TROPONINI, MYOGLOBIN in the last 168 hours.  Invalid input(s): CK ------------------------------------------------------------------------------------------------------------------ Invalid input(s): POCBNP  ---------------------------------------------------------------------------------------------------------------  Urinalysis    Component Value Date/Time   COLORURINE YELLOW (A) 10/10/2016 0057   APPEARANCEUR CLEAR (A) 10/10/2016 0057   LABSPEC 1.021 10/10/2016 0057   PHURINE 5.0 10/10/2016 0057   GLUCOSEU 50 (A) 10/10/2016 0057   HGBUR MODERATE (A) 10/10/2016 0057   BILIRUBINUR NEGATIVE 10/10/2016 0057   KETONESUR NEGATIVE 10/10/2016 0057   PROTEINUR NEGATIVE 10/10/2016 0057   NITRITE NEGATIVE 10/10/2016 0057   LEUKOCYTESUR NEGATIVE 10/10/2016 0057     RADIOLOGY: No results found.  EKG: Orders placed or performed during the hospital encounter of 11/16/16  . ED EKG  . ED EKG  . EKG 12-Lead  . EKG 12-Lead    IMPRESSION AND PLAN:  * MRSA bacteremia   Vanc Iv   Check repeat cx.   PICC removed.   May need removal of medport and TEE   ID consult   Repeat cx today.  * A fib    Cardizem, on eliquis.  * Lung cancer   Cont with Oncology after Bacteremia resolved.  * Urinary retention and catheter   Urology consult.  All  the records are reviewed and case discussed with ED provider. Management plans discussed with the patient, family and they are in agreement.  CODE STATUS:Full.    Code Status Orders        Start     Ordered   12/28/16 1608  Full code  Continuous     12/28/16 1607    Code Status History    Date Active Date Inactive Code Status Order ID Comments User Context   11/16/2016 10:22 PM 11/19/2016  8:42 PM Full Code [id number]  Theodoro Grist, MD Inpatient   09/10/2016 12:37 PM 09/13/2016  3:11 PM Full Code [id number]  Max Sane, MD Inpatient       TOTAL TIME TAKING CARE OF THIS PATIENT: 50 minutes.  Spoke to Daughter and son in room about the plan.  Vaughan Basta M.D on 12/28/2016   Between 7am to 6pm - Pager - [phone]  After 6pm go to [email or web address] - password [email or web address] [facility]  Office  [phone]  CC: Primary care physician; Claiborne Billings, MD   Note: This dictation was prepared with Dragon dictation along with smaller phrase technology. Any transcriptional errors that result from this process are unintentional.

## 2016-12-29 ENCOUNTER — Inpatient Hospital Stay
Admission: AD | Admit: 2016-12-29 | Discharge: 2016-12-29 | Disposition: A | Discharge status: Home / Self Care | Payer: Medicare Other | Source: Other Acute Inpatient Hospital | Attending: Cardiology | Admitting: Cardiology

## 2016-12-29 ENCOUNTER — Encounter: Payer: Self-pay | Admitting: Cardiology

## 2016-12-29 ENCOUNTER — Encounter
Admission: AD | Disposition: A | Discharge status: Skilled Nursing Facility | Payer: Self-pay | Source: Other Acute Inpatient Hospital | Attending: Internal Medicine

## 2016-12-29 DIAGNOSIS — R338 Other retention of urine: Secondary | ICD-10-CM

## 2016-12-29 DIAGNOSIS — N401 Enlarged prostate with lower urinary tract symptoms: Secondary | ICD-10-CM

## 2016-12-29 HISTORY — PX: TEE WITHOUT CARDIOVERSION: SHX5443

## 2016-12-29 LAB — BASIC METABOLIC PANEL
Anion gap: 5 (ref 5–15)
BUN: 8 mg/dL (ref 6–20)
CO2: 27 mmol/L (ref 22–32)
CREATININE: 0.48 mg/dL — AB (ref 0.61–1.24)
Calcium: 8.4 mg/dL — ABNORMAL LOW (ref 8.9–10.3)
Chloride: 104 mmol/L (ref 101–111)
GFR calc Af Amer: >60 mL/min (ref >60)
GLUCOSE: 99 mg/dL (ref 65–99)
POTASSIUM: 3.7 mmol/L (ref 3.5–5.1)
SODIUM: 136 mmol/L (ref 135–145)

## 2016-12-29 LAB — CBC
HEMATOCRIT: 28.8 % — AB (ref 40.0–52.0)
Hemoglobin: 9.9 g/dL — ABNORMAL LOW (ref 13.0–18.0)
MCH: 31.8 pg (ref 26.0–34.0)
MCHC: 34.2 g/dL (ref 32.0–36.0)
MCV: 92.9 fL (ref 80.0–100.0)
PLATELETS: 146 10*3/uL — AB (ref 150–440)
RBC: 3.1 MIL/uL — ABNORMAL LOW (ref 4.40–5.90)
RDW: 14.4 % (ref 11.5–14.5)
WBC: 5.4 10*3/uL (ref 3.8–10.6)

## 2016-12-29 SURGERY — ECHOCARDIOGRAM, TRANSESOPHAGEAL
Anesthesia: Moderate Sedation

## 2016-12-29 MED ORDER — MIDAZOLAM HCL 2 MG/2ML IJ SOLN
INTRAMUSCULAR | Status: AC | PRN
  Administered 2016-12-29: 2 mg via INTRAVENOUS

## 2016-12-29 MED ORDER — VANCOMYCIN HCL 10 G IV SOLR
1500.0000 mg | INTRAVENOUS | Status: DC
  Administered 2016-12-29 – 2016-12-31 (×3): 1500 mg via INTRAVENOUS
  Filled 2016-12-29 (×4): qty 1500

## 2016-12-29 MED ORDER — LIDOCAINE VISCOUS 2 % MT SOLN
OROMUCOSAL | Status: AC
  Filled 2016-12-29: qty 15

## 2016-12-29 MED ORDER — FENTANYL CITRATE (PF) 100 MCG/2ML IJ SOLN
INTRAMUSCULAR | Status: AC | PRN
  Administered 2016-12-29: 50 ug via INTRAVENOUS

## 2016-12-29 MED ORDER — BUTAMBEN-TETRACAINE-BENZOCAINE 2-2-14 % EX AERO
INHALATION_SPRAY | CUTANEOUS | Status: AC
  Filled 2016-12-29: qty 20

## 2016-12-29 MED ORDER — MIDAZOLAM HCL 2 MG/2ML IJ SOLN
INTRAMUSCULAR | Status: AC
  Filled 2016-12-29: qty 2

## 2016-12-29 MED ORDER — FENTANYL CITRATE (PF) 100 MCG/2ML IJ SOLN
INTRAMUSCULAR | Status: AC
  Filled 2016-12-29: qty 2

## 2016-12-29 MED ORDER — SODIUM CHLORIDE 0.9 % IV SOLN
INTRAVENOUS | Status: DC
  Administered 2016-12-29: 12:00:00 via INTRAVENOUS

## 2016-12-29 NOTE — Consult Note (Signed)
[facility] Infectious Disease     Reason for Consult:MRSA bacteremia   Referring Physician: Boykin Reaper Date of Admission:  12/28/2016   Principal Problem:   Bacteremia   HPI: Frank Cowan is a 76 y.o. male admitted from [facility] hospital with PICC line associated MRSA infection. He has hx of Stage 3 SCLC, chronic indwelling foley cath due to BPH  (recent cx with Acinetobacter).  He was prior admitted end of Aug at [facility] where he apparently had aspiration PNA and had PICC place 8/27. Per his daughter the PICC was last used on labor day but was left in place. He then developed fevers and lethargy.  Since transfer from [place] he has had bcx done and are NGTD. He had picc removed at [facility]. Had TEE done today and neg. Feeling much better. Eating more.   Past Medical History:  Diagnosis Date  . Atrial fibrillation ([place])   . Bacteremia   . Cancer ([place])    lung  . Cataract   . Collagen vascular disease ([place])   . History of kidney stones   . Kidney stones 2014  . Pulmonary emboli ([place])   . Squamous cell lung cancer, right ([place]) 05/14/2016   Past Surgical History:  Procedure Laterality Date  . CYSTOSCOPY W/ URETEROSCOPY W/ LITHOTRIPSY    . ENDOBRONCHIAL ULTRASOUND N/A 06/12/2016   Procedure: ENDOBRONCHIAL ULTRASOUND;  Surgeon: Laverle Hobby, MD;  Location: [facility] ORS;  Service: Pulmonary;  Laterality: N/A;  . PORTA CATH INSERTION N/A 06/26/2016   Procedure: Glori Luis Cath Insertion;  Surgeon: Algernon Huxley, MD;  Location: [place] CV LAB;  Service: Cardiovascular;  Laterality: N/A;  . TONSILLECTOMY     Social History  Substance Use Topics  . Smoking status: Former Smoker    Packs/day: 0.50    Years: 55.00    Quit date: 03/28/2016  . Smokeless tobacco: Former Systems developer  . Alcohol use No     Comment: Not lately   Family History  Problem Relation Age of Onset  . Heart disease Mother   . Heart disease Father   . Cancer Sister 6       luekemia    Allergies: No  Known Allergies  Current antibiotics: Antibiotics Given (last 72 hours)    Date/Time Action Medication Dose Rate   12/28/16 2330 Given   [MAR Hold] cefdinir (OMNICEF) 125 MG/5ML suspension 300 mg (MAR Hold since 12/29/16 1221) 300 mg    12/29/16 0218 New Bag/Given   [MAR Hold] vancomycin (VANCOCIN) 1,500 mg in sodium chloride 0.9 % 500 mL IVPB (MAR Hold since 12/29/16 1221) 1,500 mg 250 mL/hr      MEDICATIONS: . [YBO Hold] apixaban  5 mg Oral BID  . butamben-tetracaine-benzocaine      . [MAR Hold] cefdinir  300 mg Oral BID  . [MAR Hold] clonazePAM  0.5 mg Oral BID  . [MAR Hold] diltiazem  120 mg Oral Daily  . fentaNYL      . [MAR Hold] FLUoxetine  40 mg Oral Daily  . [MAR Hold] gabapentin  100 mg Oral TID  . [MAR Hold] hydroxychloroquine  200 mg Oral Daily  . lidocaine      . midazolam      . [MAR Hold] tamsulosin  0.4 mg Oral QHS    Review of Systems - 11 systems reviewed and negative per HPI   OBJECTIVE: Temp:  [97.7 F (36.5 C)-98.1 F (36.7 C)] 97.7 F (36.5 C) (09/17 1222) Pulse Rate:  [74-141] 108 (09/17  1329) Resp:  [14-24] 23 (09/17 1329) BP: (96-118)/(61-74) 115/74 (09/17 1329) SpO2:  [92 %-98 %] 94 % (09/17 1329) Weight:  [77.7 kg (171 lb 3.2 oz)] 77.7 kg (171 lb 3.2 oz) (09/16 1441) Physical Exam  Constitutional: He is oriented to person, place, and time. Thin frail but sitting up in bed. HENT: anicteric  Mouth/Throat: Oropharynx is clear and moist. No oropharyngeal exudate.  Cardiovascular: Normal rate, regular rhythm and normal heart sounds.  Portath R chest wall wnl  Pulmonary/Chest: Effort normal and breath sounds normal. No respiratory distress. He has no wheezes.  Abdominal: Soft. Bowel sounds are normal. He exhibits no distension. There is no tenderness.  Lymphadenopathy:  He has no cervical adenopathy.  Neurological: He is alert and oriented to person, place, and time.  Skin: Skin is warm and dry. No rash noted. No erythema.  Psychiatric: He has a  normal mood and affect. His behavior is normal.     LABS: Results for orders placed or performed during the hospital encounter of 12/28/16 (from the past 48 hour(s))  CULTURE, BLOOD (ROUTINE X 2) w Reflex to ID Panel     Status: None (Preliminary result)   Collection Time: 12/28/16  4:26 PM  Result Value Ref Range   Specimen Description BLOOD RIGHT ANTECUBITAL    Special Requests      BOTTLES DRAWN AEROBIC AND ANAEROBIC Blood Culture adequate volume   Culture NO GROWTH < 24 HOURS    Report Status PENDING   CBC     Status: Abnormal   Collection Time: 12/28/16  4:26 PM  Result Value Ref Range   WBC 6.1 3.8 - 10.6 K/uL   RBC 3.10 (L) 4.40 - 5.90 MIL/uL   Hemoglobin 9.5 (L) 13.0 - 18.0 g/dL   HCT 28.5 (L) 40.0 - 52.0 %   MCV 92.0 80.0 - 100.0 fL   MCH 30.7 26.0 - 34.0 pg   MCHC 33.4 32.0 - 36.0 g/dL   RDW 15.0 (H) 11.5 - 14.5 %   Platelets 161 150 - 440 K/uL  Basic metabolic panel     Status: Abnormal   Collection Time: 12/28/16  4:26 PM  Result Value Ref Range   Sodium 137 135 - 145 mmol/L   Potassium 3.9 3.5 - 5.1 mmol/L   Chloride 105 101 - 111 mmol/L   CO2 27 22 - 32 mmol/L   Glucose, Bld 97 65 - 99 mg/dL   BUN 9 6 - 20 mg/dL   Creatinine, Ser 0.46 (L) 0.61 - 1.24 mg/dL   Calcium 8.2 (L) 8.9 - 10.3 mg/dL   GFR calc non Af Amer >60 >60 mL/min   GFR calc Af Amer >60 >60 mL/min    Comment: (NOTE) The eGFR has been calculated using the CKD EPI equation. This calculation has not been validated in all clinical situations. eGFR's persistently <60 mL/min signify possible Chronic Kidney Disease.    Anion gap 5 5 - 15  Vancomycin, random     Status: None   Collection Time: 12/28/16  4:26 PM  Result Value Ref Range   Vancomycin Rm 26     Comment:        Random Vancomycin therapeutic range is dependent on dosage and time of specimen collection. A peak range is 20.0-40.0 ug/mL A trough range is 5.0-15.0 ug/mL          CULTURE, BLOOD (ROUTINE X 2) w Reflex to ID Panel      Status: None (Preliminary result)   Collection  Time: 12/28/16  4:38 PM  Result Value Ref Range   Specimen Description BLOOD LEFT ANTECUBITAL    Special Requests      BOTTLES DRAWN AEROBIC AND ANAEROBIC Blood Culture adequate volume   Culture NO GROWTH < 24 HOURS    Report Status PENDING   Vancomycin, random     Status: None   Collection Time: 12/28/16  9:29 PM  Result Value Ref Range   Vancomycin Rm 21     Comment:        Random Vancomycin therapeutic range is dependent on dosage and time of specimen collection. A peak range is 20.0-40.0 ug/mL A trough range is 5.0-15.0 ug/mL          Basic metabolic panel     Status: Abnormal   Collection Time: 12/29/16  5:56 AM  Result Value Ref Range   Sodium 136 135 - 145 mmol/L   Potassium 3.7 3.5 - 5.1 mmol/L   Chloride 104 101 - 111 mmol/L   CO2 27 22 - 32 mmol/L   Glucose, Bld 99 65 - 99 mg/dL   BUN 8 6 - 20 mg/dL   Creatinine, Ser 0.48 (L) 0.61 - 1.24 mg/dL   Calcium 8.4 (L) 8.9 - 10.3 mg/dL   GFR calc non Af Amer >60 >60 mL/min   GFR calc Af Amer >60 >60 mL/min    Comment: (NOTE) The eGFR has been calculated using the CKD EPI equation. This calculation has not been validated in all clinical situations. eGFR's persistently <60 mL/min signify possible Chronic Kidney Disease.    Anion gap 5 5 - 15  CBC     Status: Abnormal   Collection Time: 12/29/16  5:56 AM  Result Value Ref Range   WBC 5.4 3.8 - 10.6 K/uL   RBC 3.10 (L) 4.40 - 5.90 MIL/uL   Hemoglobin 9.9 (L) 13.0 - 18.0 g/dL   HCT 28.8 (L) 40.0 - 52.0 %   MCV 92.9 80.0 - 100.0 fL   MCH 31.8 26.0 - 34.0 pg   MCHC 34.2 32.0 - 36.0 g/dL   RDW 14.4 11.5 - 14.5 %   Platelets 146 (L) 150 - 440 K/uL   No components found for: ESR, C REACTIVE PROTEIN MICRO: Recent Results (from the past 720 hour(s))  CULTURE, BLOOD (ROUTINE X 2) w Reflex to ID Panel     Status: None (Preliminary result)   Collection Time: 12/28/16  4:26 PM  Result Value Ref Range Status   Specimen  Description BLOOD RIGHT ANTECUBITAL  Final   Special Requests   Final    BOTTLES DRAWN AEROBIC AND ANAEROBIC Blood Culture adequate volume   Culture NO GROWTH < 24 HOURS  Final   Report Status PENDING  Incomplete  CULTURE, BLOOD (ROUTINE X 2) w Reflex to ID Panel     Status: None (Preliminary result)   Collection Time: 12/28/16  4:38 PM  Result Value Ref Range Status   Specimen Description BLOOD LEFT ANTECUBITAL  Final   Special Requests   Final    BOTTLES DRAWN AEROBIC AND ANAEROBIC Blood Culture adequate volume   Culture NO GROWTH < 24 HOURS  Final   Report Status PENDING  Incomplete   12/25/16  OSH Blood Culture, Routine Methicillin resistant Staphylococcus aureus (A)   Gram Stain Result Gram positive cocci   Specimen  Blood  Result Narrative  Critical results called to [place]09/13/18 at 9:13 PM. Results read back and acknowledged. Critical result called to [facility] [place]/ms. 9/15/20181:52 PM  Results were read back and verified.  Organism Antibiotic Method Susceptibility  Methicillin resistant Staphylococcus aureus Ciprofloxacin MIC SUSCEPTIBILITY RESULT Resistant   Clindamycin MIC SUSCEPTIBILITY RESULT Resistant   Erythromycin MIC SUSCEPTIBILITY RESULT Resistant   Gentamicin MIC SUSCEPTIBILITY RESULT Susceptible   Levofloxacin MIC SUSCEPTIBILITY RESULT Resistant   Linezolid MIC SUSCEPTIBILITY RESULT Susceptible   Minocycline MIC SUSCEPTIBILITY RESULT    Moxifloxacin MIC SUSCEPTIBILITY RESULT Resistant   Nitrofurantoin MIC SUSCEPTIBILITY RESULT Susceptible   Oxacillin MIC SUSCEPTIBILITY RESULT Resistant   Comment: Oxacillin=R, update isolate name to MRSA   Quinupristin + Dalfopristin MIC SUSCEPTIBILITY RESULT Susceptible   Rifampin MIC SUSCEPTIBILITY RESULT Susceptible   Tigecycline MIC SUSCEPTIBILITY RESULT Susceptible   Trimethoprim + Sulfamethoxazole MIC SUSCEPTIBILITY RESULT Susceptible   Tetracycline MIC SUSCEPTIBILITY RESULT Susceptible   Vancomycin  MIC SUSCEPTIBILITY RESULT Susceptible   9/13/ UCX  Urine Culture, Comprehensive >100,000 CFU/mL Acinetobacter baumannii complex (A)   Specimen  Urine  Result Narrative  Specimen Source: Urine  Organism Antibiotic Method Susceptibility  Acinetobacter baumannii complex Ampicillin MIC SUSCEPTIBILITY RESULT Resistant   Ampicillin + Sulbactam MIC SUSCEPTIBILITY RESULT Susceptible   Aztreonam MIC SUSCEPTIBILITY RESULT Intermediate   Cefazolin MIC SUSCEPTIBILITY RESULT Resistant   Cefepime MIC SUSCEPTIBILITY RESULT Susceptible   Ceftriaxone MIC SUSCEPTIBILITY RESULT Susceptible   Ciprofloxacin MIC SUSCEPTIBILITY RESULT Susceptible   Gentamicin MIC SUSCEPTIBILITY RESULT Susceptible   Imipenem MIC SUSCEPTIBILITY RESULT Susceptible   Meropenem MIC SUSCEPTIBILITY RESULT Susceptible   Moxifloxacin MIC SUSCEPTIBILITY RESULT Susceptible   Nitrofurantoin MIC SUSCEPTIBILITY RESULT Resistant   Tigecycline MIC SUSCEPTIBILITY RESULT Susceptible   Tobramycin MIC SUSCEPTIBILITY RESULT Susceptible   Trimethoprim + Sulfamethoxazole MIC SUSCEPTIBILITY RESULT Susceptible    IMAGING: Nm Pet Image Restag (ps) Skull Base To Thigh  Result Date: 12/18/2016 CLINICAL DATA:  Subsequent treatment strategy for left squamous cell lung cancer. Prior chemo radiation December 2017. EXAM: NUCLEAR MEDICINE PET SKULL BASE TO THIGH TECHNIQUE: 12.6 mCi F-18 FDG was injected intravenously. Full-ring PET imaging was performed from the skull base to thigh after the radiotracer. CT data was obtained and used for attenuation correction and anatomic localization. FASTING BLOOD GLUCOSE:  Value: 88 mg/dl COMPARISON:  Multiple exams, including CT chest from 12/03/2016 and PET-CT from 05/20/2016 FINDINGS: NECK No hypermetabolic lymph nodes in the neck. Chronic ischemic microvascular white matter disease intracranially. CHEST Indistinct left upper lobe airspace opacity partially corresponding to the site of the mass shown on prior PET-CT,  upper portion measuring 7.0 by 5.7 cm and lower portion with slightly different morphology measuring 7.0 by 5.1 cm. Maximum SUV 4.5, formerly 21.3. The overall size of the lesion is larger than before but much of this is probably due to radiation pneumonitis surrounding the original lesion. The prior paratracheal and AP window adenopathy as well as the right hilar adenopathy have been effectively treated. For example the previously 1.4 cm right paratracheal lymph node currently measures about 0.6 cm, with current SUV 2.5 similar to background mediastinal activity, and prior SUV 14.9. Similarly the prior right hilar pathologic and hypermetabolic lymph node a similar to background mediastinal activity, although there is right perihilar and paramediastinal airspace opacity likely from the radiation pneumonitis. The bilateral moderate pleural effusions with passive atelectasis. Mild cardiomegaly. Coronary, aortic arch, and branch vessel atherosclerotic vascular disease. Emphysema. There is patchy ground-glass opacity in the right middle lobe and right lower lobe. ABDOMEN/PELVIS No abnormal hypermetabolic activity within the liver, pancreas, adrenal glands, or spleen. No hypermetabolic lymph nodes in the abdomen or pelvis. Aortoiliac atherosclerotic vascular disease. Left renal  new hypodense lesions appear photopenic and most compatible with cysts. Some of these lesions in the left kidney lower pole have some complex elements. Sigmoid colon diverticulosis. Foley catheter in the urinary bladder. SKELETON No focal hypermetabolic activity to suggest skeletal metastasis. Vague activity along the right posterolateral and left apical chest wall is likely muscular or physiologic. IMPRESSION: 1. Compared to the prior PET-CT, there has been interval effective therapy of the left upper lobe mass and mediastinal and right hilar adenopathy. 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 Activity in the original primary mass is reduced to maximum SUV 4.5 (formerly 21.3) and for example in the right paratracheal region SUV has decreased from prior 14.9 to current 2.5. 2. No new metastatic lesions. 3. Moderate bilateral pleural effusions. Ground-glass opacities in the right lung could be from alveolitis, asymmetric edema, or a response to prior radiation therapy. 4. 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 Aortic Atherosclerosis (ICD10-I70.0). Stable left renal cysts, at least one of which is complex along the left kidney lower pole, but with associated photopenia. Sigmoid colon diverticulosis. Electronically Signed   By: Van Clines M.D.   On: 12/18/2016 14:25   CXR 12/28/16 Left suprahilar and right perihilar areas of consolidation are similar, likely radiation induced.  Increasing right infrahilar and medial right lower lobe airspace disease. Cannot exclude concurrent infection or aspiration.  Similar small left pleural effusion with adjacent airspace disease, most likely atelectasis.   Electronically Signed By: Shelda Altes M.D. On: 12/28/2016 09:26  Result Narrative  CLINICAL DATA:Dyspnea.  EXAM: PORTABLE CHEST 1 VIEW  COMPARISON:12/25/2016 CT. Most recent chest radiograph of 12/05/2016.  FINDINGS: Right-sided Port-A-Cath terminates at the low SVC. Midline trachea. Mild cardiomegaly. Small left pleural effusion is similar. No pneumothorax. Again identified is left suprahilar consolidation with air bronchograms. Right perihilar airspace disease is similar. Slight increase in right infrahilar and medial lower lobe airspace disease since the prior radiograph. This may also be progressive since the scout exam of 12/25/2016 chest radiograph.  Left lower lobe airspace disease is not significantly changed.    Assessment:   Frank Cowan is a 76  y.o. male with SCLC undergoing treatment with oncology admitted from [facility] after being found to be febrile and have MRSA bacteremia associated with a PICC line.  Apparently picc had been placed 8/27  for treatment of aspiration PNA. Also has UTI with acinetobacter, has recently placed indwelling foley for BPH.  CLinically improved, TEE neg, BCX fu 9/16 ngtd  Will need removal of Portacath ad recommended by IDSA recommendations followed by 14 days of IV Vanco.   Recommendations Remove Portacath - discussed with patient and daughter Following removal would place Picc the next day and can dc on 2 weeks IV vanco from date cath removed  Thank you very much for allowing me to participate in the care of this patient. Please call with questions.   Cheral Marker. Ola Spurr, MD

## 2016-12-29 NOTE — NC FL2 (Signed)
[place] LEVEL OF CARE SCREENING TOOL     IDENTIFICATION  Patient Name: Frank Cowan Birthdate: Dec 18, 1940 Sex: male Admission Date (Current Location): 12/28/2016  [place] and [place] Number:  Engineering geologist and Address:  [facility], [address], [place], [place] [address]      Provider Number: [id number]  Attending Physician Name and Address:  Hillary Bow, MD  Relative Name and Phone Number:       Current Level of Care: Hospital Recommended Level of Care: [place] Prior Approval Number:    Date Approved/Denied:   PASRR Number: [id number]  Discharge Plan: SNF    Current Diagnoses: Patient Active Problem List   Diagnosis Date Noted  . Bacteremia 12/28/2016  . Pressure injury of skin 11/17/2016  . Bilateral pulmonary embolism ([place]) 11/16/2016  . Hypotension 11/16/2016  . Anemia 11/16/2016  . Atrial fibrillation with RVR ([place]) 11/16/2016  . Fever   . Squamous cell lung cancer, left ([place])   . Palliative care by specialist   . Failure to thrive in adult 09/10/2016  . Goals of care, counseling/discussion 06/22/2016  . Squamous cell lung cancer, right ([place]) 05/14/2016    Orientation RESPIRATION BLADDER Height & Weight     Self  O2 (2L) Indwelling catheter (chronic foley) Weight: 171 lb 3.2 oz (77.7 kg) Height:  6' (182.9 cm)  BEHAVIORAL SYMPTOMS/MOOD NEUROLOGICAL BOWEL NUTRITION STATUS      Continent Diet (See DC summary)  AMBULATORY STATUS COMMUNICATION OF NEEDS Skin   Extensive Assist Verbally PU Stage and Appropriate Care, Other (Comment)       Reason for Consult:Stage 3 pressure injury to coccyx.  Began during EMS transport to SNF per patient and this area remains moist, slowing healing.  Present on admission.  Wound type:stage 3 pressure injury Pressure Injury POA: Yes Measurement: 3 cm x 0.3 cm x 0.2 cm  Wound XTG:GYIR pink and nongranulating.  Drainage (amount, consistency, odor)  minimal serosanguinous  No odor Periwound:intact Dressing procedure/placement/frequency:Cleanse sacral wound with NS.  Apply calcium alginate to wound bed for absoprtion.  Cover with silicone border foam dressing.  Change daily.  Turn and reposition every two hours.                    Personal Care Assistance Level of Assistance  Bathing, Feeding, Dressing Bathing Assistance: Maximum assistance Feeding assistance: Limited assistance Dressing Assistance: Limited assistance     Functional Limitations Info  Sight, Hearing, Speech Sight Info: Adequate Hearing Info: Adequate Speech Info: Adequate    SPECIAL CARE FACTORS FREQUENCY  PT (By licensed PT), OT (By licensed OT)     PT Frequency: 5x OT Frequency: 5x            Contractures Contractures Info: Not present    Additional Factors Info  Code Status, Allergies, Isolation Precautions Code Status Info: Full Code Allergies Info: NKA     Isolation Precautions Info: On contact MRSA      Current Medications (12/29/2016):  This is the current hospital active medication list Current Facility-Administered Medications  Medication Dose Route Frequency Provider Last Rate Last Dose  . 0.9 %  sodium chloride infusion   Intravenous Continuous Teodoro Spray, MD      . apixaban Arne [place]) tablet 5 mg  5 mg Oral BID Vaughan Basta, MD   5 mg at 12/28/16 2118  . bisacodyl (DULCOLAX) EC tablet 5 mg  5 mg Oral Daily PRN Vaughan Basta, MD      .  cefdinir (OMNICEF) 125 MG/5ML suspension 300 mg  300 mg Oral BID Hugelmeyer, Alexis, DO   300 mg at 12/28/16 2330  . clonazePAM (KLONOPIN) tablet 0.5 mg  0.5 mg Oral BID Vaughan Basta, MD   0.5 mg at 12/28/16 2119  . diltiazem (CARDIZEM CD) 24 hr capsule 120 mg  120 mg Oral Daily Vaughan Basta, MD      . docusate sodium (COLACE) capsule 100 mg  100 mg Oral BID PRN Vaughan Basta, MD      . FLUoxetine (PROZAC) capsule 40 mg  40 mg Oral Daily Vaughan Basta, MD      . gabapentin (NEURONTIN) capsule 100 mg  100 mg Oral TID Vaughan Basta, MD   100 mg at 12/28/16 2119  . hydroxychloroquine (PLAQUENIL) tablet 200 mg  200 mg Oral Daily Vaughan Basta, MD      . naproxen (NAPROSYN) tablet 250 mg  250 mg Oral BID PRN Vaughan Basta, MD      . oxyCODONE-acetaminophen (PERCOCET/ROXICET) 5-325 MG per tablet 1-2 tablet  1-2 tablet Oral Q6H PRN Vaughan Basta, MD      . tamsulosin (FLOMAX) capsule 0.4 mg  0.4 mg Oral QHS Vaughan Basta, MD   0.4 mg at 12/28/16 2120  . vancomycin (VANCOCIN) 1,500 mg in sodium chloride 0.9 % 500 mL IVPB  1,500 mg Intravenous Q18H Pernell Dupre, RPH   Stopped at 12/29/16 4163     Discharge Medications: Please see discharge summary for a list of discharge medications.  Relevant Imaging Results:  Relevant Lab Results:   Additional Information SSN:  [id number]  [place], [place]

## 2016-12-29 NOTE — Consult Note (Signed)
Subjective: CC: Urinary retention.  Hx:  [name] is a 76 yo WM who was admitted for an infected PICC line.   He had been in rehab following an aspiration pneumonia.   He has a history of urinary retention that has been managed with a foley for the past month and I was asked to see him in consultation by Dr. Anselm Jungling.   The patient is on tamsulosin but it is not clear whether that has just been since the urinary retention or prior.  He had some voiding complaints prior to developing retention.   He is tolerating the foley well.  His GU history is significant for a right PCNL about 2 years ago in [place] and he has passed other stones.  He has had no prostate procedures.    ROS:  Review of Systems  Constitutional: Negative for chills and fever.  Respiratory: Negative for shortness of breath.   Cardiovascular: Negative for chest pain.  All other systems reviewed and are negative.   No Known Allergies  Past Medical History:  Diagnosis Date  . Atrial fibrillation ([place])   . Bacteremia   . Cancer ([place])    lung  . Cataract   . Collagen vascular disease ([place])   . History of kidney stones   . Kidney stones 2014  . Pulmonary emboli ([place])   . 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 CYSTOSCOPY W/ URETEROSCOPY W/ LITHOTRIPSY    . ENDOBRONCHIAL ULTRASOUND N/A 06/12/2016   Procedure: ENDOBRONCHIAL ULTRASOUND;  Surgeon: Laverle Hobby, MD;  Location: [facility] ORS;  Service: Pulmonary;  Laterality: N/A;  . PORTA CATH INSERTION N/A 06/26/2016   Procedure: Glori Luis Cath Insertion;  Surgeon: Algernon Huxley, MD;  Location: [facility];  Service: Cardiovascular;  Laterality: N/A;  . TEE WITHOUT CARDIOVERSION N/A 12/29/2016   Procedure: TRANSESOPHAGEAL ECHOCARDIOGRAM (TEE);  Surgeon: Teodoro Spray, MD;  Location: [facility] ORS;  Service: Cardiovascular;  Laterality: N/A;  . TONSILLECTOMY      Social History   Social History  .  Marital status: Married    Spouse name: N/A  . Number of children: N/A  . Years of education: N/A   Occupational History  . Not on file.   Social History Main Topics  . Smoking status: Former Smoker    Packs/day: 0.50    Years: 55.00    Quit date: 03/28/2016  . Smokeless tobacco: Former Systems developer  . Alcohol use No     Comment: Not lately  . Drug use: No  . Sexual activity: Not on file   Other Topics Concern  . Not on file   Social History Narrative  . No narrative on file    Family History  Problem Relation Age of Onset  . Heart disease Mother   . Heart disease Father   . Cancer Sister 6       luekemia    Anti-infectives: Anti-infectives    Start     Dose/Rate Route Frequency Ordered Stop   12/29/16 2200  vancomycin (VANCOCIN) 1,500 mg in sodium chloride 0.9 % 500 mL IVPB     1,500 mg 250 mL/hr over 120 Minutes Intravenous Every 24 hours 12/29/16 1338     12/29/16 1000  hydroxychloroquine (PLAQUENIL) tablet 200 mg     200 mg Oral Daily 12/28/16 1635     12/29/16 0200  vancomycin (VANCOCIN) 1,500 mg in sodium chloride 0.9 % 500 mL IVPB  Status:  Discontinued     1,500 mg 250 mL/hr over 120 Minutes Intravenous Every 18 hours 12/28/16 2337 12/29/16 1338   12/28/16 2245  cefdinir (OMNICEF) 125 MG/5ML suspension 300 mg     300 mg Oral 2 times daily 12/28/16 2230 01/05/17 2159      Current Facility-Administered Medications  Medication Dose Route Frequency Provider Last Rate Last Dose  . apixaban (ELIQUIS) tablet 5 mg  5 mg Oral BID Vaughan Basta, MD   5 mg at 12/29/16 1452  . bisacodyl (DULCOLAX) EC tablet 5 mg  5 mg Oral Daily PRN Vaughan Basta, MD      . butamben-tetracaine-benzocaine (CETACAINE) 05-16-12 % spray           . cefdinir (OMNICEF) 125 MG/5ML suspension 300 mg  300 mg Oral BID Hugelmeyer, Alexis, DO   300 mg at 12/28/16 2330  . clonazePAM (KLONOPIN) tablet 0.5 mg  0.5 mg Oral BID Vaughan Basta, MD   0.5 mg at 12/29/16 1452  .  diltiazem (CARDIZEM CD) 24 hr capsule 120 mg  120 mg Oral Daily Vaughan Basta, MD   120 mg at 12/29/16 1451  . docusate sodium (COLACE) capsule 100 mg  100 mg Oral BID PRN Vaughan Basta, MD      . fentaNYL (SUBLIMAZE) 100 MCG/2ML injection           . FLUoxetine (PROZAC) capsule 40 mg  40 mg Oral Daily Vaughan Basta, MD   40 mg at 12/29/16 1452  . gabapentin (NEURONTIN) capsule 100 mg  100 mg Oral TID Vaughan Basta, MD   100 mg at 12/29/16 1452  . hydroxychloroquine (PLAQUENIL) tablet 200 mg  200 mg Oral Daily Vaughan Basta, MD   200 mg at 12/29/16 1451  . lidocaine (XYLOCAINE) 2 % viscous mouth solution           . midazolam (VERSED) 2 MG/2ML injection           . naproxen (NAPROSYN) tablet 250 mg  250 mg Oral BID PRN Vaughan Basta, MD      . oxyCODONE-acetaminophen (PERCOCET/ROXICET) 5-325 MG per tablet 1-2 tablet  1-2 tablet Oral Q6H PRN Vaughan Basta, MD      . tamsulosin (FLOMAX) capsule 0.4 mg  0.4 mg Oral QHS Vaughan Basta, MD   0.4 mg at 12/28/16 2120  . vancomycin (VANCOCIN) 1,500 mg in sodium chloride 0.9 % 500 mL IVPB  1,500 mg Intravenous Q24H Ramond Dial, RPH       Past medical, surgical, social and family history reviewed.   Objective: Vital signs in last 24 hours: Temp:  [97.6 F (36.4 C)-98 F (36.7 C)] 97.6 F (36.4 C) (09/17 1610) Pulse Rate:  [84-141] 100 (09/17 1610) Resp:  [14-24] 20 (09/17 1610) BP: (96-118)/(61-74) 110/73 (09/17 1610) SpO2:  [92 %-98 %] 94 % (09/17 1610)  Intake/Output from previous day: 09/16 0701 - 09/17 0700 In: 500 [IV Piggyback:500] Out: 2350 [Urine:2350] Intake/Output this shift: No intake/output data recorded.   Physical Exam  Constitutional: He is oriented to person, place, and time.  Elderly, ill appearing WM in NAD  HENT:  Head: Normocephalic and atraumatic.  Cardiovascular: Normal rate.   Pulmonary/Chest: Effort normal. No respiratory distress.   Abdominal: Soft. He exhibits no mass. There is no tenderness.  Genitourinary:  Genitourinary Comments: He has a normal uncircumcised phallus with a foley at the meatus.   Scrotum, testes and epididymis are normal.   Rectal exam demonstrated normal sphincter tone.  No masses are  noted.   Prostate is 2+ without nodules or induration.  SV's not palpable.     Musculoskeletal: Normal range of motion. He exhibits edema. He exhibits no tenderness.  Neurological: He is alert and oriented to person, place, and time.  Skin: Skin is warm and dry.  Psychiatric: Mood and affect normal.  Vitals reviewed.   Lab Results:   Recent Labs  12/28/16 1626 12/29/16 0556  WBC 6.1 5.4  HGB 9.5* 9.9*  HCT 28.5* 28.8*  PLT 161 146*   BMET  Recent Labs  12/28/16 1626 12/29/16 0556  NA 137 136  K 3.9 3.7  CL 105 104  CO2 27 27  GLUCOSE 97 99  BUN 9 8  CREATININE 0.46* 0.48*  CALCIUM 8.2* 8.4*   PT/INR No results for input(s): LABPROT, INR in the last 72 hours. ABG No results for input(s): PHART, HCO3 in the last 72 hours.  Invalid input(s): PCO2, PO2  Studies/Results: No results found.    I have reviewed his hospital notes and labs.  Care Everywhere notes reviewed.    Assessment: BPH with retention:   He is currently on tamsulosin.   Depending on planned discharge, he could have a voiding trial in the morning or f/u with his regular urologist in [place] as an outpatient when he has completed rehab.   He should have the foley changed if he is going to go home with it.      CC: Dr. Heath Gold.     Carlos Heber J 12/29/2016 [phone]

## 2016-12-29 NOTE — Procedures (Signed)
    TRANSESOPHAGEAL ECHOCARDIOGRAM   NAME:  Frank Cowan   MRN: [id number] DOB:  1940/07/30   ADMIT DATE: 12/28/2016  INDICATIONS:   PROCEDURE:   Informed consent was obtained prior to the procedure. The risks, benefits and alternatives for the procedure were discussed and the patient comprehended these risks.  Risks include, but are not limited to, cough, sore throat, vomiting, nausea, somnolence, esophageal and stomach trauma or perforation, bleeding, low blood pressure, aspiration, pneumonia, infection, trauma to the teeth and death.    After a procedural time-out, the patient was given 2 mg versed and 50 mcg fentanyl for moderate sedation.  The oropharynx was anesthetized 1 cc of topical 1% viscous lidocaine.  The transesophageal probe was inserted in the esophagus and stomach without difficulty and multiple views were obtained.    COMPLICATIONS:    There were no immediate complications.  FINDINGS:  LEFT VENTRICLE: EF = 55%. No regional wall motion abnormalities.  RIGHT VENTRICLE: Normal size and function.   LEFT ATRIUM: normal size. No smoke or thrombi.   LEFT ATRIAL APPENDAGE: No thrombus.   RIGHT ATRIUM: normal size. No thrombus  AORTIC VALVE:  Trileaflet. No vegetations seen. Trivial ai  MITRAL VALVE:    Normal.  No vegetations. Mild to mod mr  TRICUSPID VALVE: Normal. Mild tr. No vegetations  PULMONIC VALVE: Grossly normal.  INTERATRIAL SEPTUM: No PFO or ASD.  PERICARDIUM: No effusion  DESCENDING AORTA: Normal     CONCLUSION:  No vegetations. No pfo

## 2016-12-29 NOTE — Progress Notes (Addendum)
Pharmacy Antibiotic Note  Frank Cowan is a 76 y.o. male admitted on 12/28/2016 with MRSA bacteremia and PICC line infection.  Pharmacy has been consulted for Vancomycin dosing.  ** Patient was a direct admission from [facility]. According to discharge documents on Care Everywhere- patient was receiving Vancomycin 1500mg  IV every 12 hours. Based on notes, it appears pt had received 3 doses of this dose before transfer. Patient was initially started on 1g q 12 hours. Trough drawn before the 3rd total dose (load of 1750mg ) was 10. Dose was then increased to 1500mg  q 12 hr.  However this was not really steady state.   Plan: Based on the  2 vancomycin  random levels draw today, will adjust patients vancomycin regimen.   Estimated Kinetics:  Ke: 0.073   T1/2: 9.5   Vd:55    Calculated Kinetics Based on Random levels: Ke: 0.043   T1/2: 16hr  Due to the fact that patient will most likely need extended therapy, I will change interval from 18 to 24 hours for ease of dosing.  Will draw a level on 9/20 at 2130 Based on new ke predicted trough is 15.8  Height: 6' (182.9 cm) Weight: 171 lb 3.2 oz (77.7 kg) IBW/kg (Calculated) : 77.6  Temp (24hrs), Avg:98 F (36.7 C), Min:97.7 F (36.5 C), Max:98.1 F (36.7 C)   Recent Labs Lab 12/28/16 1626 12/28/16 2129 12/29/16 0556  WBC 6.1  --  5.4  CREATININE 0.46*  --  0.48*  VANCORANDOM 26 21  --     Estimated Creatinine Clearance: 83.5 mL/min (A) (by C-G formula based on SCr of 0.48 mg/dL (L)).    No Known Allergies  Antimicrobials this admission: 9/13 vancomycin  >>  Dose adjustments this admission:   Microbiology results: 9/13 BCx: MRSA  9/13 UCx: Acinetobacter   Thank you for allowing pharmacy to be a part of this patient's care.  Ramond Dial, PharmD, BCPS Clinical Pharmacist 12/29/2016 1:39 PM

## 2016-12-29 NOTE — Progress Notes (Signed)
*  PRELIMINARY RESULTS* Echocardiogram Echocardiogram Transesophageal has been performed.  Sherrie Sport 12/29/2016, 1:03 PM

## 2016-12-29 NOTE — Progress Notes (Signed)
[facility] NAME: Demetrick Eichenberger    MR#:  [id number]  DATE OF BIRTH:  08/25/1940  SUBJECTIVE:  CHIEF COMPLAINT:  No chief complaint on file.  No pain or fever. NPO for TEE  REVIEW OF SYSTEMS:    Review of Systems  Constitutional: Positive for malaise/fatigue. Negative for chills and fever.  HENT: Negative for sore throat.   Eyes: Negative for blurred vision, double vision and pain.  Respiratory: Negative for cough, hemoptysis, shortness of breath and wheezing.   Cardiovascular: Negative for chest pain, palpitations, orthopnea and leg swelling.  Gastrointestinal: Negative for abdominal pain, constipation, diarrhea, heartburn, nausea and vomiting.  Genitourinary: Negative for dysuria and hematuria.  Musculoskeletal: Negative for back pain and joint pain.  Skin: Negative for rash.  Neurological: Positive for weakness. Negative for sensory change, speech change, focal weakness and headaches.  Endo/Heme/Allergies: Does not bruise/bleed easily.  Psychiatric/Behavioral: Negative for depression. The patient is not nervous/anxious.     DRUG ALLERGIES:  No Known Allergies  VITALS:  Blood pressure 103/69, pulse (!) 103, temperature 97.7 F (36.5 C), temperature source Oral, resp. rate (!) 23, height 6' (1.829 m), weight 77.7 kg (171 lb 3.2 oz), SpO2 94 %.  PHYSICAL EXAMINATION:   Physical Exam  GENERAL:  76 y.o.-year-old patient lying in the bed with no acute distress.  EYES: Pupils equal, round, reactive to light and accommodation. No scleral icterus. Extraocular muscles intact.  HEENT: Head atraumatic, normocephalic. Oropharynx and nasopharynx clear.  NECK:  Supple, no jugular venous distention. No thyroid enlargement, no tenderness.  LUNGS: Normal breath sounds bilaterally, no wheezing, rales, rhonchi. No use of accessory muscles of respiration.  CARDIOVASCULAR: S1, S2 normal. No murmurs, rubs, or gallops.  ABDOMEN: Soft,  nontender, nondistended. Bowel sounds present. No organomegaly or mass.  EXTREMITIES: No cyanosis, clubbing or edema b/l.    NEUROLOGIC: Cranial nerves II through XII are intact. No focal Motor or sensory deficits b/l.   PSYCHIATRIC: The patient is alert and oriented x 3.  SKIN: No obvious rash, lesion, or ulcer.   LABORATORY PANEL:   CBC  Recent Labs Lab 12/29/16 0556  WBC 5.4  HGB 9.9*  HCT 28.8*  PLT 146*   ------------------------------------------------------------------------------------------------------------------ Chemistries   Recent Labs Lab 12/29/16 0556  NA 136  K 3.7  CL 104  CO2 27  GLUCOSE 99  BUN 8  CREATININE 0.48*  CALCIUM 8.4*   ------------------------------------------------------------------------------------------------------------------  Cardiac Enzymes No results for input(s): TROPONINI in the last 168 hours. ------------------------------------------------------------------------------------------------------------------  RADIOLOGY:  No results found.   ASSESSMENT AND PLAN:   * MRSA bacteremia Vancomycin Blood cx repeated on 12/29/2016   PICC removed in outside hospital  discussed with Dr. Ubaldo Glassing. TEE today.   ID consult  * A fib    Cardizem, on eliquis.  * Lung cancer   Cont with Oncology after Bacteremia resolved.  * Urinary retention and catheter   Urology follow-up as outpatient.  All the records are reviewed and case discussed with Care Management/Social Worker. Management plans discussed with the patient, family and they are in agreement.  CODE STATUS: FULL CODE  DVT Prophylaxis: SCDs  TOTAL TIME TAKING CARE OF THIS PATIENT: 35 minutes.   POSSIBLE D/C IN 2-3 DAYS, DEPENDING ON CLINICAL CONDITION.  Hillary Bow R M.D on 12/29/2016 at 1:00 PM  Between 7am to 6pm - Pager - [phone]  After 6pm go to [email or web address] - password [email or web address] [facility]  Office  [phone]  CC: Primary care  physician; Claiborne Billings, MD  Note: This dictation was prepared with Dragon dictation along with smaller phrase technology. Any transcriptional errors that result from this process are unintentional.

## 2016-12-29 NOTE — Progress Notes (Signed)
Pt back from TEE. VSS. Alert and Oriented.   Patient and Daughter at bedside updated on current plan of care: Vascular surgery consult to remove port. Infectious Disease recommendation- continue current antibiotic therapy. Awaiting urology consult to address foley catheter. Will continue to closely monitor.

## 2016-12-29 NOTE — Consult Note (Signed)
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  Began during EMS transport to SNF per patient and this area remains moist, slowing healing.  Present on admission.  Wound type:stage 3 pressure injury Pressure Injury POA: Yes Measurement: 3 cm x 0.3 cm x 0.2 cm  Wound CCE:QFDV pink and nongranulating.  Drainage (amount, consistency, odor) minimal serosanguinous  No odor Periwound:intact Dressing procedure/placement/frequency:Cleanse sacral wound with NS.  Apply calcium alginate to wound bed for absoprtion.  Cover with silicone border foam dressing.  Change daily.  Turn and reposition every two hours.  Will not follow at this time.  Please re-consult if needed.  Domenic Moras RN BSN [place] Pager [phone]
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
## 2016-12-30 ENCOUNTER — Encounter
Admission: AD | Disposition: A | Discharge status: Skilled Nursing Facility | Payer: Self-pay | Source: Other Acute Inpatient Hospital | Attending: Internal Medicine

## 2016-12-30 ENCOUNTER — Inpatient Hospital Stay: Payer: Medicare Other | Admitting: Oncology

## 2016-12-30 ENCOUNTER — Inpatient Hospital Stay: Payer: Medicare Other

## 2016-12-30 DIAGNOSIS — I4891 Unspecified atrial fibrillation: Secondary | ICD-10-CM

## 2016-12-30 DIAGNOSIS — C3412 Malignant neoplasm of upper lobe, left bronchus or lung: Secondary | ICD-10-CM

## 2016-12-30 DIAGNOSIS — C3492 Malignant neoplasm of unspecified part of left bronchus or lung: Secondary | ICD-10-CM

## 2016-12-30 DIAGNOSIS — Z7189 Other specified counseling: Secondary | ICD-10-CM

## 2016-12-30 DIAGNOSIS — Z515 Encounter for palliative care: Secondary | ICD-10-CM

## 2016-12-30 DIAGNOSIS — B9562 Methicillin resistant Staphylococcus aureus infection as the cause of diseases classified elsewhere: Secondary | ICD-10-CM

## 2016-12-30 HISTORY — PX: PORTA CATH REMOVAL: CATH118286

## 2016-12-30 SURGERY — PORTA CATH REMOVAL
Anesthesia: Moderate Sedation | Laterality: Right

## 2016-12-30 MED ORDER — MIDAZOLAM HCL 2 MG/2ML IJ SOLN
INTRAMUSCULAR | Status: DC | PRN
  Administered 2016-12-30: 1 mg via INTRAVENOUS

## 2016-12-30 MED ORDER — MIDAZOLAM HCL 2 MG/2ML IJ SOLN
INTRAMUSCULAR | Status: AC
  Filled 2016-12-30: qty 2

## 2016-12-30 MED ORDER — LIDOCAINE-EPINEPHRINE (PF) 2 %-1:200000 IJ SOLN
INTRAMUSCULAR | Status: AC
  Filled 2016-12-30: qty 20

## 2016-12-30 MED ORDER — NAPROXEN 250 MG PO TABS
250.0000 mg | ORAL_TABLET | Freq: Two times a day (BID) | ORAL | Status: DC | PRN
  Filled 2016-12-30: qty 1

## 2016-12-30 MED ORDER — APIXABAN 5 MG PO TABS
5.0000 mg | ORAL_TABLET | Freq: Two times a day (BID) | ORAL | Status: DC
  Administered 2016-12-30 – 2017-01-01 (×4): 5 mg via ORAL
  Filled 2016-12-30 (×4): qty 1

## 2016-12-30 MED ORDER — FENTANYL CITRATE (PF) 100 MCG/2ML IJ SOLN
INTRAMUSCULAR | Status: DC | PRN
Start: 2016-12-30 — End: 2016-12-30
  Administered 2016-12-30: 25 ug via INTRAVENOUS

## 2016-12-30 MED ORDER — FENTANYL CITRATE (PF) 100 MCG/2ML IJ SOLN
INTRAMUSCULAR | Status: AC
  Filled 2016-12-30: qty 2

## 2016-12-30 MED ORDER — GUAIFENESIN-DM 100-10 MG/5ML PO SYRP
5.0000 mL | ORAL_SOLUTION | ORAL | Status: DC | PRN
  Administered 2016-12-31: 5 mL via ORAL
  Filled 2016-12-30 (×2): qty 5

## 2016-12-30 MED ORDER — SODIUM CHLORIDE 0.9 % IV SOLN
INTRAVENOUS | Status: DC
  Administered 2016-12-30: 15:00:00 via INTRAVENOUS

## 2016-12-30 SURGICAL SUPPLY — 5 items
DERMABOND ADVANCED (GAUZE/BANDAGES/DRESSINGS) ×2
DERMABOND ADVANCED .7 DNX12 (GAUZE/BANDAGES/DRESSINGS) ×1 IMPLANT
PACK ANGIOGRAPHY (CUSTOM PROCEDURE TRAY) ×3 IMPLANT
SUT MNCRL AB 4-0 PS2 18 (SUTURE) ×3 IMPLANT
SUT VICRYL+ 3-0 36IN CT-1 (SUTURE) ×3 IMPLANT

## 2016-12-30 NOTE — Progress Notes (Signed)
Infectious Disease Long Term IV Antibiotic Orders  Diagnosis: Portacath MRSA infeciton   Culture results Critical results called to Tatiana Cantonon09/13/18 at 9:13 PM. Results read back and acknowledged. Critical result called to [facility] [place]/ms. 9/15/20181:52 PM Results were read back and verified.   Organism Antibiotic Method Susceptibility  Methicillin resistant Staphylococcus aureus Ciprofloxacin MIC SUSCEPTIBILITY RESULT Resistant   Clindamycin MIC SUSCEPTIBILITY RESULT Resistant   Erythromycin MIC SUSCEPTIBILITY RESULT Resistant   Gentamicin MIC SUSCEPTIBILITY RESULT Susceptible   Levofloxacin MIC SUSCEPTIBILITY RESULT Resistant   Linezolid MIC SUSCEPTIBILITY RESULT Susceptible   Minocycline MIC SUSCEPTIBILITY RESULT    Moxifloxacin MIC SUSCEPTIBILITY RESULT Resistant   Nitrofurantoin MIC SUSCEPTIBILITY RESULT Susceptible   Oxacillin MIC SUSCEPTIBILITY RESULT Resistant   Comment: Oxacillin=R, update isolate name to MRSA    Quinupristin + Dalfopristin MIC SUSCEPTIBILITY RESULT Susceptible   Rifampin MIC SUSCEPTIBILITY RESULT Susceptible   Tigecycline MIC SUSCEPTIBILITY RESULT Susceptible   Trimethoprim + Sulfamethoxazole MIC SUSCEPTIBILITY RESULT Susceptible   Tetracycline MIC SUSCEPTIBILITY RESULT Susceptible   Vancomycin MIC SUSCEPTIBILITY RESULT Susceptible   9/13/ UCX        Urine Culture, Comprehensive >100,000 CFU/mL Acinetobacter baumannii complex (A)   Specimen      Urine      Result Narrative      Specimen Source: Urine      Organism Antibiotic Method Susceptibility  Acinetobacter baumannii complex Ampicillin MIC SUSCEPTIBILITY RESULT Resistant   Ampicillin + Sulbactam MIC SUSCEPTIBILITY RESULT Susceptible   Aztreonam MIC SUSCEPTIBILITY RESULT Intermediate   Cefazolin MIC SUSCEPTIBILITY RESULT Resistant   Cefepime MIC SUSCEPTIBILITY RESULT Susceptible   Ceftriaxone MIC SUSCEPTIBILITY RESULT Susceptible   Ciprofloxacin MIC SUSCEPTIBILITY RESULT  Susceptible   Gentamicin MIC SUSCEPTIBILITY RESULT Susceptible   Imipenem MIC SUSCEPTIBILITY RESULT Susceptible   Meropenem MIC SUSCEPTIBILITY RESULT Susceptible   Moxifloxacin MIC SUSCEPTIBILITY RESULT Susceptible   Nitrofurantoin MIC SUSCEPTIBILITY RESULT Resistant   Tigecycline MIC SUSCEPTIBILITY RESULT Susceptible   Tobramycin MIC SUSCEPTIBILITY RESULT Susceptible   Trimethoprim + Sulfamethoxazole MIC SUSCEPTIBILITY RESULT Susceptible     Allergies: No Known Allergies  Discharge antibiotics Vancomycin           1500       mg  every    24           hours .     Goal vancomycin trough 15-20.    Pharmacy to adjust dosing based on levels  PICC Care per protocol Labs weekly while on IV antibiotics      CBC w diff   Comprehensive met panel Vancomycin Trough    Planned duration of antibiotics 2 weeks from removal of portacath  Stop date Oct 3  Follow up clinic date 2 weeks  FAX weekly labs to [phone]  [name], MD

## 2016-12-30 NOTE — Progress Notes (Signed)
[facility] NAME: Frank Cowan    MR#:  [id number]  DATE OF BIRTH:  [birth year]  SUBJECTIVE:  CHIEF COMPLAINT:  No chief complaint on file.  No pain or fever. Waiting for port removal today  REVIEW OF SYSTEMS:    Review of Systems  Constitutional: Positive for malaise/fatigue. Negative for chills and fever.  HENT: Negative for sore throat.   Eyes: Negative for blurred vision, double vision and pain.  Respiratory: Negative for cough, hemoptysis, shortness of breath and wheezing.   Cardiovascular: Negative for chest pain, palpitations, orthopnea and leg swelling.  Gastrointestinal: Negative for abdominal pain, constipation, diarrhea, heartburn, nausea and vomiting.  Genitourinary: Negative for dysuria and hematuria.  Musculoskeletal: Negative for back pain and joint pain.  Skin: Negative for rash.  Neurological: Positive for weakness. Negative for sensory change, speech change, focal weakness and headaches.  Endo/Heme/Allergies: Does not bruise/bleed easily.  Psychiatric/Behavioral: Negative for depression. The patient is not nervous/anxious.     DRUG ALLERGIES:  No Known Allergies  VITALS:  Blood pressure 105/60, pulse 80, temperature 97.8 F (36.6 C), temperature source Oral, resp. rate 20, height 6' (1.829 m), weight 77.7 kg (171 lb 3.2 oz), SpO2 99 %.  PHYSICAL EXAMINATION:   Physical Exam  GENERAL:  76 y.o.-year-old patient lying in the bed with no acute distress.  EYES: Pupils equal, round, reactive to light and accommodation. No scleral icterus. Extraocular muscles intact.  HEENT: Head atraumatic, normocephalic. Oropharynx and nasopharynx clear.  NECK:  Supple, no jugular venous distention. No thyroid enlargement, no tenderness.  LUNGS: Normal breath sounds bilaterally, no wheezing, rales, rhonchi. No use of accessory muscles of respiration.  Port-a-cath site on chest wall looks normal CARDIOVASCULAR: S1, S2 normal.  No murmurs, rubs, or gallops.  ABDOMEN: Soft, nontender, nondistended. Bowel sounds present. No organomegaly or mass.  EXTREMITIES: No cyanosis, clubbing or edema b/l.    NEUROLOGIC: Cranial nerves II through XII are intact. No focal Motor or sensory deficits b/l.   PSYCHIATRIC: The patient is alert and oriented x 3.  SKIN: No obvious rash, lesion, or ulcer.   LABORATORY PANEL:   CBC  Recent Labs Lab 12/29/16 0556  WBC 5.4  HGB 9.9*  HCT 28.8*  PLT 146*   ------------------------------------------------------------------------------------------------------------------ Chemistries   Recent Labs Lab 12/29/16 0556  NA 136  K 3.7  CL 104  CO2 27  GLUCOSE 99  BUN 8  CREATININE 0.48*  CALCIUM 8.4*   ------------------------------------------------------------------------------------------------------------------  Cardiac Enzymes No results for input(s): TROPONINI in the last 168 hours. ------------------------------------------------------------------------------------------------------------------  RADIOLOGY:  No results found.   ASSESSMENT AND PLAN:   * MRSA bacteremia On IV Vancomycin Blood cx repeated on 12/29/2016   PICC removed in outside hospital TEE showed no endocarditis  Discussed with Dr. Ola Spurr. Will get Port removed today. Discussed with vascular surgery. PICC tomorrow after 24 hrs of port removal Can be discharged back to SNF tomorrow with PICC and IV vancomycin for 2 weeks from port removal date.  * A fib    Cardizem, on eliquis.  * Lung cancer   Cont with Oncology after Bacteremia resolved.  * Urinary retention and catheter   Urology has seen patient.  Will remove foley for voiding trial today. On flomax. Reinsert foley if bladder scan >500   All the records are reviewed and case discussed with Care Management/Social Worker. Management plans discussed with the patient, family and they are in agreement.  CODE STATUS: FULL  CODE  DVT Prophylaxis: SCDs  TOTAL TIME TAKING CARE OF THIS PATIENT: 35 minutes.   POSSIBLE D/C TOMORROW, DEPENDING ON CLINICAL CONDITION.  Hillary Bow R M.D on 12/30/2016 at 12:13 PM  Between 7am to 6pm - Pager - [phone]  After 6pm go to [email or web address] - password [email or web address] [facility]  Office  [phone]  CC: Primary care physician; Claiborne Billings, MD  Note: This dictation was prepared with Dragon dictation along with smaller phrase technology. Any transcriptional errors that result from this process are unintentional.

## 2016-12-30 NOTE — Consult Note (Signed)
Consultation Note Date: 12/30/2016   Patient Name: Frank Cowan  DOB: 09/01/1940  MRN: [id number]  Age / Sex: 76 y.o., male  PCP: Frank Billings, MD Referring Physician: Hillary Bow, MD  Reason for Consultation: Establishing goals of care  HPI/Patient Profile: 76 y.o. 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 Grayland Cowan and Dr. Baruch Gouty) s/p chemotherapy and radiation admitted on 12/28/2016 with infect d/t MRSA bacteremia thought to be s/t PICC line. PAC to be removed today d/t bacteremia.   Clinical Assessment and Goals of Care: I met today at Frank [facility]an bedside (he slept most of the time) and with his daughter-in-law Frank Cowan. Frank Cowan gives much insight into [facility] QOL and family's thoughts on his cancer and prognosis. Frank Cowan has good understanding of his medical issues. She says that this past PET scan was a big step for decision making as family had discussed a plan A and a plan B depending on if the cancer was maintained or worse. They are relieved that it appears to be maintained but understand that the next step to pursuing immunotherapy with Durvalumab must wait until this bacteremia is treated.   Frank Cowan says that they perceive his QOL as decent right now. He seems happy at [facility] and has a couple nurses he has really bonded with and they look out for him and spend time with him. They are happy with his care there. Frank Cowan says she is aware of his complications and barriers to improvement. She also shares that daughter, Frank Cowan, is very close with patient and she is having a harder time and will not give up until they have tried everything. Frank Cowan shares that Frank Cowan became very frustrated Sat evening and began to say that he was tired and done and that Frank Cowan was very upset. This was an  isolated incident and has not since mentioned anything of this nature.   Discussed recommendation for DNR status and gave Hard Choices booklet. Discussed for them to further discuss with Dr. Grayland Cowan with further decline to offer guidance and support especially for Frank [facility]an regarding palliative follow up. Unfortunately I do not believe that outpatient palliative services are available at [facility]. Frank Cowan was appreciative of information and will pass resources and information along to her husband and family.   Primary Decision Maker NEXT OF KIN wife refers to their children Frank Cowan and Frank Cowan   - Full coded for now - Family hopeful for return to [facility] - Plan to pursue immunotherapy once bacteremia treated  Code Status/Advance Care Planning:  Full code - recommended consideration of DNR   Symptom Management:   No current symptoms  Urology rec d/c foley and voiding trial  Palliative Prophylaxis:   Aspiration, Delirium Protocol, Frequent Pain Assessment and Turn Reposition  Additional Recommendations (Limitations, Scope, Preferences):  Full Scope Treatment  Psycho-social/Spiritual:   Desire for further Chaplaincy support:no  Prognosis:  Unable to determine - poor with lung cancer and declining functional status with multiple complications with infections  Discharge Planning: Return to [facility]      Primary Diagnoses: Present on Admission: **None**   I have reviewed the medical record, interviewed the patient and family, and examined the patient. The following aspects are pertinent.  Past Medical History:  Diagnosis Date  . Atrial fibrillation ([place])   . Bacteremia   . Cancer ([place])    lung  . Cataract   . Collagen vascular disease ([place])   . History of kidney stones   . Kidney stones 2014  . Pulmonary emboli ([place])   . Squamous cell lung cancer, right ([place]) 05/14/2016   Social History   Social History  . Marital status:  Married    Spouse name: N/A  . Number of children: N/A  . Years of education: N/A   Social History Main Topics  . Smoking status: Former Smoker    Packs/day: 0.50    Years: 55.00    Quit date: 03/28/2016  . Smokeless tobacco: Former Systems developer  . Alcohol use No     Comment: Not lately  . Drug use: No  . Sexual activity: Not Asked   Other Topics Concern  . None   Social History Narrative  . None   Family History  Problem Relation Age of Onset  . Heart disease Mother   . Heart disease Father   . Cancer Sister 6       luekemia   Scheduled Meds: . apixaban  5 mg Oral BID  . cefdinir  300 mg Oral BID  . clonazePAM  0.5 mg Oral BID  . diltiazem  120 mg Oral Daily  . FLUoxetine  40 mg Oral Daily  . gabapentin  100 mg Oral TID  . hydroxychloroquine  200 mg Oral Daily  . tamsulosin  0.4 mg Oral QHS   Continuous Infusions: . vancomycin Stopped (12/30/16 0003)   PRN Meds:.bisacodyl, docusate sodium, naproxen, oxyCODONE-acetaminophen No Known Allergies Review of Systems  Unable to perform ROS: Acuity of condition    Physical Exam  Constitutional: He appears well-developed. He appears lethargic.  HENT:  Head: Normocephalic and atraumatic.  Cardiovascular: Normal rate.  An irregularly irregular rhythm present.  Pulmonary/Chest: Effort normal. No accessory muscle usage. No tachypnea. No respiratory distress.  Abdominal: Soft. Normal appearance.  Neurological: He appears lethargic. He is disoriented.  Nursing note and vitals reviewed.   Vital Signs: BP 105/60 (BP Location: Right Arm)   Pulse 80   Temp 97.8 F (36.6 C) (Oral)   Resp 20   Ht 6' (1.829 m)   Wt 77.7 kg (171 lb 3.2 oz)   SpO2 99%   BMI 23.22 kg/m  Pain Assessment: No/denies pain       SpO2: SpO2: 99 % O2 Device:SpO2: 99 % O2 Flow Rate: .O2 Flow Rate (L/min): 3 L/min  IO: Intake/output summary:  Intake/Output Summary (Last 24 hours) at 12/30/16 1409 Last data filed at 12/30/16 0639  Gross per 24  hour  Intake                0 ml  Output             1700 ml  Net            -1700 ml    LBM: Last BM Date: 12/29/16 Baseline Weight: Weight: 77.7 kg (171 lb 3.2 oz) Most recent weight: Weight: 77.7 kg (171 lb 3.2 oz)  Palliative Assessment/Data: 30%   Flowsheet Rows     Most Recent Value  Intake Tab  Referral Department  Hospitalist  Unit at Time of Referral  Oncology Unit  Palliative Care Primary Diagnosis  Cancer  Date Notified  12/29/16  Palliative Care Type  Return patient Palliative Care  Reason for referral  Clarify Goals of Care  Date of Admission  12/28/16  # of days IP prior to Palliative referral  1  Clinical Assessment  Psychosocial & Spiritual Assessment  Palliative Care Outcomes       Time Total: 45mn  Greater than 50%  of this time was spent counseling and coordinating care related to the above assessment and plan.  Signed by: AVinie Sill NP Palliative Medicine Team Pager # [phone](M-F 8a-5p) Team Phone # [phone](Nights/Weekends)

## 2016-12-30 NOTE — Progress Notes (Signed)
Dr. Delana Meyer at bedside to speak with pt. And daughter. Tip of catheter sent to lab for culture.

## 2016-12-30 NOTE — Consult Note (Signed)
[facility] Consult Note  MRN : [id number]  Frank Cowan is a 76 y.o. (1940/10/01) male who presents with chief complaint of fever associated with an infected PICC line .  History of Present Illness: I have asked to evaluate the patient by Dr. Darvin Neighbours for removal of his Infuse-a-Port.  the patient is a 76 year old gentleman who was transferred in from the chest from the hospital after he was found to have an infected PICC line. Blood cultures grew MRSA. He also has an Infuse-a-Port.  Infuse-a-Port has been placed for treatment of his lung cancer. While here he was seen by infectious disease and removal of the Infuse-a-Port has been recommended. His TEE was done yesterday and this was negative.    Current Facility-Administered Medications  Medication Dose Route Frequency Provider Last Rate Last Dose  . bisacodyl (DULCOLAX) EC tablet 5 mg  5 mg Oral Daily PRN Vaughan Basta, MD      . cefdinir (OMNICEF) 125 MG/5ML suspension 300 mg  300 mg Oral BID Hugelmeyer, Alexis, DO   300 mg at 12/29/16 2203  . clonazePAM (KLONOPIN) tablet 0.5 mg  0.5 mg Oral BID Vaughan Basta, MD   0.5 mg at 12/29/16 2203  . diltiazem (CARDIZEM CD) 24 hr capsule 120 mg  120 mg Oral Daily Vaughan Basta, MD   120 mg at 12/29/16 1451  . docusate sodium (COLACE) capsule 100 mg  100 mg Oral BID PRN Vaughan Basta, MD      . FLUoxetine (PROZAC) capsule 40 mg  40 mg Oral Daily Vaughan Basta, MD   40 mg at 12/29/16 1452  . gabapentin (NEURONTIN) capsule 100 mg  100 mg Oral TID Vaughan Basta, MD   100 mg at 12/29/16 2203  . hydroxychloroquine (PLAQUENIL) tablet 200 mg  200 mg Oral Daily Vaughan Basta, MD   200 mg at 12/29/16 1451  . naproxen (NAPROSYN) tablet 250 mg  250 mg Oral BID PRN Vaughan Basta, MD      . oxyCODONE-acetaminophen (PERCOCET/ROXICET) 5-325 MG per tablet 1-2 tablet  1-2 tablet Oral Q6H PRN Vaughan Basta, MD      . tamsulosin (FLOMAX) capsule 0.4 mg  0.4 mg Oral QHS Vaughan Basta, MD   0.4 mg at 12/29/16 2203  . vancomycin (VANCOCIN) 1,500 mg in sodium chloride 0.9 % 500 mL IVPB  1,500 mg Intravenous Q24H Ramond Dial, RPH   Stopped at 12/30/16 0003    Past Medical History:  Diagnosis Date  . Atrial fibrillation ([place])   . Bacteremia   . Cancer ([place])    lung  . Cataract   . Collagen vascular disease ([place])   . History of kidney stones   . Kidney stones 2014  . Pulmonary emboli ([place])   . Squamous cell lung cancer, right ([place]) 05/14/2016    Past Surgical History:  Procedure Laterality Date  . CYSTOSCOPY W/ URETEROSCOPY W/ LITHOTRIPSY    . ENDOBRONCHIAL ULTRASOUND N/A 06/12/2016   Procedure: ENDOBRONCHIAL ULTRASOUND;  Surgeon: Laverle Hobby, MD;  Location: [facility] ORS;  Service: Pulmonary;  Laterality: N/A;  . PORTA CATH INSERTION N/A 06/26/2016   Procedure: Glori Luis Cath Insertion;  Surgeon: Algernon Huxley, MD;  Location: [facility];  Service: Cardiovascular;  Laterality: N/A;  . TEE WITHOUT CARDIOVERSION N/A 12/29/2016   Procedure: TRANSESOPHAGEAL ECHOCARDIOGRAM (TEE);  Surgeon: Teodoro Spray, MD;  Location: [facility] ORS;  Service: Cardiovascular;  Laterality: N/A;  . TONSILLECTOMY      Social History Social History  Substance Use Topics  . Smoking status: Former Smoker    Packs/day: 0.50    Years: 55.00    Quit date: 03/28/2016  . Smokeless tobacco: Former Systems developer  . Alcohol use No     Comment: Not lately    Family History Family History  Problem Relation Age of Onset  . Heart disease Mother   . Heart disease Father   . Cancer Sister 6       luekemia  No family history of bleeding/clotting disorders, porphyria or autoimmune disease   No Known Allergies   REVIEW OF SYSTEMS (Negative unless checked)  Constitutional: [x] Weight loss  [x] Fever  [x] Chills Cardiac: [] Chest pain   [] Chest pressure   [] Palpitations   [] Shortness of breath when  laying flat   [] Shortness of breath at rest   [x] Shortness of breath with exertion. Vascular:  [] Pain in legs with walking   [] Pain in legs at rest   [] Pain in legs when laying flat   [] Claudication   [] Pain in feet when walking  [] Pain in feet at rest  [] Pain in feet when laying flat   [] History of DVT   [] Phlebitis   [] Swelling in legs   [] Varicose veins   [] Non-healing ulcers Pulmonary:   [] Uses home oxygen   [] Productive cough   [] Hemoptysis   [] Wheeze  [] COPD   [] Asthma Neurologic:  [] Dizziness  [] Blackouts   [] Seizures   [] History of stroke   [] History of TIA  [] Aphasia   [] Temporary blindness   [] Dysphagia   [] Weakness or numbness in arms   [] Weakness or numbness in legs Musculoskeletal:  [] Arthritis   [] Joint swelling   [] Joint pain   [] Low back pain Hematologic:  [] Easy bruising  [] Easy bleeding   [] Hypercoagulable state   [] Anemic  [] Hepatitis Gastrointestinal:  [] Blood in stool   [] Vomiting blood  [] Gastroesophageal reflux/heartburn   [] Difficulty swallowing. Genitourinary:  [] Chronic kidney disease   [x] Difficult urination  [] Frequent urination  [] Burning with urination   [] Blood in urine Skin:  [] Rashes   [] Ulcers   [] Wounds Psychological:  [] History of anxiety   []  History of major depression.  Physical Examination  Vitals:   12/29/16 1329 12/29/16 1610 12/29/16 2047 12/30/16 0537  BP: 115/74 110/73 (!) 103/58 (!) 111/58  Pulse: (!) 108 100 94 87  Resp: (!) 23 20 20  (!) 24  Temp:  97.6 F (36.4 C) 97.8 F (36.6 C) 97.9 F (36.6 C)  TempSrc:  Oral Oral Oral  SpO2: 94% 94% 97% 95%  Weight:      Height:       Body mass index is 23.22 kg/m. Gen:  WD/WN, NAD Head: Conception/AT, No temporalis wasting. Prominent temp pulse not noted. Ear/Nose/Throat: Hearing grossly intact, nares w/o erythema or drainage, oropharynx w/o Erythema/Exudate Eyes: Sclera non-icteric, conjunctiva clear Neck: Trachea midline.  No JVD.  Pulmonary:  Poor air movement, respirations not labored, equal  bilaterally. Coarse rhonchi bilaterally Cardiac: irregularly irregular, normal S1, S2. Vascular:  Vessel Right Left  Radial Palpable Palpable  Ulnar Palpable Palpable  Brachial Palpable Palpable  Gastrointestinal: soft, non-tender/non-distended. No guarding/reflex.  Musculoskeletal: M/S 4/5 throughout.  Extremities without ischemic changes.  No deformity but moderate atrophy. No edema. Neurologic: Sensation grossly intact in extremities.  Symmetrical.  Speech is fluent. Motor exam as listed above. Psychiatric: Judgment intact, Mood & affect appropriate for pt's clinical situation. Dermatologic: No rashes or ulcers noted.  No cellulitis or open wounds. Lymph : No Cervical, Axillary, or Inguinal lymphadenopathy.  CBC Lab Results  Component Value Date   WBC 5.4 12/29/2016   HGB 9.9 (L) 12/29/2016   HCT 28.8 (L) 12/29/2016   MCV 92.9 12/29/2016   PLT 146 (L) 12/29/2016    BMET    Component Value Date/Time   NA 136 12/29/2016 0556   K 3.7 12/29/2016 0556   CL 104 12/29/2016 0556   CO2 27 12/29/2016 0556   GLUCOSE 99 12/29/2016 0556   BUN 8 12/29/2016 0556   CREATININE 0.48 (L) 12/29/2016 0556   CALCIUM 8.4 (L) 12/29/2016 0556   GFRNONAA >60 12/29/2016 0556   GFRAA >60 12/29/2016 0556   Estimated Creatinine Clearance: 83.5 mL/min (A) (by C-G formula based on SCr of 0.48 mg/dL (L)).  COAG Lab Results  Component Value Date   INR 1.33 11/16/2016    Radiology Nm Pet Image Restag (ps) Skull Base To Thigh  Result Date: 12/18/2016 CLINICAL DATA:  Subsequent treatment strategy for left squamous cell lung cancer. Prior chemo radiation December 2017. EXAM: NUCLEAR MEDICINE PET SKULL BASE TO THIGH TECHNIQUE: 12.6 mCi F-18 FDG was injected intravenously. Full-ring PET imaging was performed from the skull base to thigh after the radiotracer. CT data was obtained and used for attenuation correction and anatomic localization. FASTING BLOOD GLUCOSE:  Value: 88 mg/dl COMPARISON:   Multiple exams, including CT chest from 12/03/2016 and PET-CT from 05/20/2016 FINDINGS: NECK No hypermetabolic lymph nodes in the neck. Chronic ischemic microvascular white matter disease intracranially. CHEST Indistinct left upper lobe airspace opacity partially corresponding to the site of the mass shown on prior PET-CT, upper portion measuring 7.0 by 5.7 cm and lower portion with slightly different morphology measuring 7.0 by 5.1 cm. Maximum SUV 4.5, formerly 21.3. The overall size of the lesion is larger than before but much of this is probably due to radiation pneumonitis surrounding the original lesion. The prior paratracheal and AP window adenopathy as well as the right hilar adenopathy have been effectively treated. For example the previously 1.4 cm right paratracheal lymph node currently measures about 0.6 cm, with current SUV 2.5 similar to background mediastinal activity, and prior SUV 14.9. Similarly the prior right hilar pathologic and hypermetabolic lymph node a similar to background mediastinal activity, although there is right perihilar and paramediastinal airspace opacity likely from the radiation pneumonitis. The bilateral moderate pleural effusions with passive atelectasis. Mild cardiomegaly. Coronary, aortic arch, and branch vessel atherosclerotic vascular disease. Emphysema. There is patchy ground-glass opacity in the right middle lobe and right lower lobe. ABDOMEN/PELVIS No abnormal hypermetabolic activity within the liver, pancreas, adrenal glands, or spleen. No hypermetabolic lymph nodes in the abdomen or pelvis. Aortoiliac atherosclerotic vascular disease. Left renal new hypodense lesions appear photopenic and most compatible with cysts. Some of these lesions in the left kidney lower pole have some complex elements. Sigmoid colon diverticulosis. Foley catheter in the urinary bladder. SKELETON No focal hypermetabolic activity to suggest skeletal metastasis. Vague activity along the right  posterolateral and left apical chest wall is likely muscular or physiologic. IMPRESSION: 1. Compared to the prior PET-CT, there has been interval effective therapy of the left upper lobe mass and mediastinal and right hilar adenopathy. 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 Activity in the original primary mass is reduced to maximum SUV 4.5 (formerly 21.3) and for example in the right paratracheal region SUV has decreased from prior 14.9 to current 2.5. 2. No new metastatic lesions. 3. Moderate bilateral pleural  effusions. Ground-glass opacities in the right lung could be from alveolitis, asymmetric edema, or a response to prior radiation therapy. 4. 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 Aortic Atherosclerosis (ICD10-I70.0). Stable left renal cysts, at least one of which is complex along the left kidney lower pole, but with associated photopenia. Sigmoid colon diverticulosis. Electronically Signed   By: Van Clines M.D.   On: 12/18/2016 14:25      Assessment/Plan 1. MRSA bacteremia:  I concur with Dr. Grayland Ormond in the circumstances the port should be removed and the tip will be sent for culture. PICC line can be placed after 24-48 hours and Infuse-a-Port Be replaced in several weeks of the can continue his immunotherapy for his lung cancer.   A total of 60 minutes was spent with this patient and greater than 50% was spent in counseling and coordination of care with the patient.  Both the patient was given full discussion and the plan as well as his daughter [name] who I contacted by phone.  Discussion included the treatment options for vascular disease including indications for surgery and intervention.  Also discussed is the appropriate timing of treatment.  In addition medical therapy was discussed.    2. Lung cancer: Patient will  continue immunotherapy plan per Dr. Grayland Ormond and Dr. Ola Spurr. I will plan to replace the Infuse-a-Port left IJ approach in several weeks. 3. Atrial fibrillation:Continue antiarrhythmia medications as already ordered, these medications have been reviewed and there are no changes at this time.  Continue anticoagulation as ordered by Cardiology Service 4.  Urinary retention: Urology following patient currently has a Foley catheter   Hortencia Pilar, MD  12/30/2016 9:52 AM    This note was created with Dragon medical transcription system.  Any error is purely unintentional

## 2016-12-30 NOTE — Progress Notes (Signed)
[name] INFECTIOUS DISEASE PROGRESS NOTE Date of Admission:  12/28/2016     ID: Frank Cowan is a 76 y.o. male with Principal Problem:   Bacteremia   Subjective: No fevers, has been seen by vascular. No new complaints  ROS  Eleven systems are reviewed and negative except per hpi  Medications:  Antibiotics Given (last 72 hours)    Date/Time Action Medication Dose Rate   12/28/16 2330 Given   cefdinir (OMNICEF) 125 MG/5ML suspension 300 mg 300 mg    12/29/16 0218 New Bag/Given   vancomycin (VANCOCIN) 1,500 mg in sodium chloride 0.9 % 500 mL IVPB 1,500 mg 250 mL/hr   12/29/16 1451 Given   hydroxychloroquine (PLAQUENIL) tablet 200 mg 200 mg    12/29/16 2203 Given   cefdinir (OMNICEF) 125 MG/5ML suspension 300 mg 300 mg    12/29/16 2203 New Bag/Given   vancomycin (VANCOCIN) 1,500 mg in sodium chloride 0.9 % 500 mL IVPB 1,500 mg 250 mL/hr     . apixaban  5 mg Oral BID  . cefdinir  300 mg Oral BID  . clonazePAM  0.5 mg Oral BID  . diltiazem  120 mg Oral Daily  . FLUoxetine  40 mg Oral Daily  . gabapentin  100 mg Oral TID  . hydroxychloroquine  200 mg Oral Daily  . tamsulosin  0.4 mg Oral QHS    Objective: Vital signs in last 24 hours: Temp:  [97.6 F (36.4 C)-97.9 F (36.6 C)] 97.8 F (36.6 C) (09/18 1016) Pulse Rate:  [80-100] 80 (09/18 1016) Resp:  [20-24] 20 (09/18 1016) BP: (103-111)/(58-73) 105/60 (09/18 1016) SpO2:  [94 %-99 %] 99 % (09/18 1016) Constitutional: He is oriented to person, place, and time. Thin frail but sitting up in bed. HENT: anicteric  Mouth/Throat: Oropharynx is clear and moist. No oropharyngeal exudate.  Cardiovascular: Normal rate, regular rhythm and normal heart sounds.  Portath R chest wall wnl  Pulmonary/Chest: Effort normal and breath sounds normal. No respiratory distress. He has no wheezes.  Abdominal: Soft. Bowel sounds are normal. He exhibits no distension. There is no tenderness.  Lymphadenopathy:  He has no cervical  adenopathy.  Neurological: He is alert and oriented to person, place, and time.  Skin: Skin is warm and dry. No rash noted. No erythema.  Psychiatric: He has a normal mood and affect. His behavior is normal.   Lab Results  Recent Labs  12/28/16 1626 12/29/16 0556  WBC 6.1 5.4  HGB 9.5* 9.9*  HCT 28.5* 28.8*  NA 137 136  K 3.9 3.7  CL 105 104  CO2 27 27  BUN 9 8  CREATININE 0.46* 0.48*    Microbiology: Critical results called to [facility]/18 at 9:13 PM. Results read back and acknowledged. Critical result called to [facility] [place]/ms. 9/15/20181:52 PM Results were read back and verified.   Organism Antibiotic Method Susceptibility  Methicillin resistant Staphylococcus aureus Ciprofloxacin MIC SUSCEPTIBILITY RESULT Resistant   Clindamycin MIC SUSCEPTIBILITY RESULT Resistant   Erythromycin MIC SUSCEPTIBILITY RESULT Resistant   Gentamicin MIC SUSCEPTIBILITY RESULT Susceptible   Levofloxacin MIC SUSCEPTIBILITY RESULT Resistant   Linezolid MIC SUSCEPTIBILITY RESULT Susceptible   Minocycline MIC SUSCEPTIBILITY RESULT    Moxifloxacin MIC SUSCEPTIBILITY RESULT Resistant   Nitrofurantoin MIC SUSCEPTIBILITY RESULT Susceptible   Oxacillin MIC SUSCEPTIBILITY RESULT Resistant   Comment: Oxacillin=R, update isolate name to MRSA    Quinupristin + Dalfopristin MIC SUSCEPTIBILITY RESULT Susceptible   Rifampin MIC SUSCEPTIBILITY RESULT Susceptible   Tigecycline MIC SUSCEPTIBILITY RESULT Susceptible  Trimethoprim + Sulfamethoxazole MIC SUSCEPTIBILITY RESULT Susceptible   Tetracycline MIC SUSCEPTIBILITY RESULT Susceptible   Vancomycin MIC SUSCEPTIBILITY RESULT Susceptible   9/13/ UCX        Urine Culture, Comprehensive >100,000 CFU/mL Acinetobacter baumannii complex (A)   Specimen      Urine      Result Narrative      Specimen Source: Urine      Organism Antibiotic Method Susceptibility  Acinetobacter baumannii complex Ampicillin MIC SUSCEPTIBILITY RESULT Resistant    Ampicillin + Sulbactam MIC SUSCEPTIBILITY RESULT Susceptible   Aztreonam MIC SUSCEPTIBILITY RESULT Intermediate   Cefazolin MIC SUSCEPTIBILITY RESULT Resistant   Cefepime MIC SUSCEPTIBILITY RESULT Susceptible   Ceftriaxone MIC SUSCEPTIBILITY RESULT Susceptible   Ciprofloxacin MIC SUSCEPTIBILITY RESULT Susceptible   Gentamicin MIC SUSCEPTIBILITY RESULT Susceptible   Imipenem MIC SUSCEPTIBILITY RESULT Susceptible   Meropenem MIC SUSCEPTIBILITY RESULT Susceptible   Moxifloxacin MIC SUSCEPTIBILITY RESULT Susceptible   Nitrofurantoin MIC SUSCEPTIBILITY RESULT Resistant   Tigecycline MIC SUSCEPTIBILITY RESULT Susceptible   Tobramycin MIC SUSCEPTIBILITY RESULT Susceptible   Trimethoprim + Sulfamethoxazole MIC SUSCEPTIBILITY RESULT Susceptible    Studies/Results: No results found.  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  Apparently picc had been placed 8/27  for treatment of aspiration PNA. Also has UTI with acinetobacter, has recently placed indwelling foley for BPH.  CLinically improved, TEE neg, BCX fu 9/16 ngtd  Will need removal of Portacath as recommended by IDSA recommendations followed by 14 days of IV Vanco.   Recommendations Removal of Portacath is planned Following removal would place Picc the next day and can dc on 2 weeks IV vanco from date cath removed - see abx order sheet Has seen urology who rec possible voiding trial which I think would be great if we can get the foley out.    If cannot be removed will need it changed. Otherwise continue cefdinir for a total of 10 day for the acinetobacter in [place].   Thank you very much for the consult. Will follow with you.  [name]   12/30/2016, 2:44 PM

## 2016-12-30 NOTE — Op Note (Signed)
  OPERATIVE NOTE   PROCEDURE: Removal of Infuse-a-Port   PRE-OPERATIVE DIAGNOSIS: MRSA bacteremia  POST-OPERATIVE DIAGNOSIS: Same  SURGEON: Hortencia Pilar, M.D.  ANESTHESIA: Conscious sedation was administered under my direct supervision by the interventional radiology RN. IV Versed plus fentanyl were utilized. Continuous ECG, pulse oximetry and blood pressure was monitored throughout the entire procedure.  Conscious sedation was for a total of 17 minutes.   ESTIMATED BLOOD LOSS: Minimal   SPECIMEN(S):  Infuse-a-port intact  INDICATIONS:   Frank Cowan is a 76 y.o. y.o. male who presents with MRSA bacteremia.  Patient is therefore undergoing removal of the port. The risks and benefits of been reviewed all questions answered patient agrees to proceed with port removal   DESCRIPTION: After obtaining full informed written consent, the patient is brought to special procedures and positioned supine.  The patient is on IV antibiotics for his sepsis.  The patient was prepped and draped in the standard fashion appropriate time out is called.    After infiltrating 1% lidocaine with epinephrine into the soft tissues and skin surrounding the port the previous incisional scar is reopened with an 11 blade scalpel.  The port is slipped from the pocket and subsequently removed without difficulty otherwise intact.  Pressure is held at the base of the neck for 5 minutes, a pocket is irrigated. Subtotally the wound is packed with saline moistened gauze and sterile dressings applied.  The patient tolerated the procedure without changes  COMPLICATIONS: None  CONDITION: Good  Hortencia Pilar, M.D. [facility] Office: [phone]   12/30/2016, 4:38 PM

## 2016-12-31 ENCOUNTER — Encounter: Payer: Self-pay | Admitting: Vascular Surgery

## 2016-12-31 ENCOUNTER — Inpatient Hospital Stay: Payer: Medicare Other

## 2016-12-31 DIAGNOSIS — Z515 Encounter for palliative care: Secondary | ICD-10-CM

## 2016-12-31 DIAGNOSIS — R7881 Bacteremia: Secondary | ICD-10-CM

## 2016-12-31 DIAGNOSIS — C3490 Malignant neoplasm of unspecified part of unspecified bronchus or lung: Secondary | ICD-10-CM

## 2016-12-31 MED ORDER — SODIUM CHLORIDE 0.9% FLUSH: 10.0000 mL | INTRAVENOUS | Status: DC | PRN

## 2016-12-31 MED ORDER — SODIUM CHLORIDE 0.9% FLUSH
10.0000 mL | Freq: Two times a day (BID) | INTRAVENOUS | Status: DC
  Administered 2016-12-31 – 2017-01-01 (×2): 10 mL

## 2016-12-31 NOTE — Progress Notes (Signed)
Foley catheter inserted without problem, witnessed by Andre Lefort RN. Urine clear yellow. Waiting for PICC line to be place by IV team- verified with IV team RN will be placed today.

## 2016-12-31 NOTE — Plan of Care (Signed)
Problem: Education: Goal: Knowledge of [facility] Education information/materials will improve Outcome: Progressing VSS, free of falls during shift.  Denies pain.  No needs overnight.  Foley not d/c'ed d/t concern if needing to reinsert.  Day shift RN reported urology placed current foley, AC, charge RN, this RN agreed best to wait until urology on site.  No nursing policy to reference re: bladder training when removing foley.  Bed in low position, call bell within reach.  Q2h repositioning for skin integrity, healing.  WCTM.

## 2016-12-31 NOTE — Progress Notes (Addendum)
[place] [facility] [place] NAME: Frank Cowan    MR#:  [id number]  DATE OF BIRTH:  1940-08-21  SUBJECTIVE:  CHIEF COMPLAINT:  No chief complaint on file. No new issues, waiting for PICC line placement and Foley removal  REVIEW OF SYSTEMS:    Review of Systems  Constitutional: Positive for malaise/fatigue. Negative for chills and fever.  HENT: Negative for sore throat.   Eyes: Negative for blurred vision, double vision and pain.  Respiratory: Negative for cough, hemoptysis, shortness of breath and wheezing.   Cardiovascular: Negative for chest pain, palpitations, orthopnea and leg swelling.  Gastrointestinal: Negative for abdominal pain, constipation, diarrhea, heartburn, nausea and vomiting.  Genitourinary: Negative for dysuria and hematuria.  Musculoskeletal: Negative for back pain and joint pain.  Skin: Negative for rash.  Neurological: Positive for weakness. Negative for sensory change, speech change, focal weakness and headaches.  Endo/Heme/Allergies: Does not bruise/bleed easily.  Psychiatric/Behavioral: Negative for depression. The patient is not nervous/anxious.     DRUG ALLERGIES:  No Known Allergies  VITALS:  Blood pressure 102/62, pulse 92, temperature 98.1 F (36.7 C), resp. rate 17, height 6' (1.829 m), weight 77.7 kg (171 lb 4.8 oz), SpO2 95 %.  PHYSICAL EXAMINATION:   Physical Exam  GENERAL:  76 y.o.-year-old patient lying in the bed with no acute distress.  EYES: Pupils equal, round, reactive to light and accommodation. No scleral icterus. Extraocular muscles intact.  HEENT: Head atraumatic, normocephalic. Oropharynx and nasopharynx clear.  NECK:  Supple, no jugular venous distention. No thyroid enlargement, no tenderness.  LUNGS: Normal breath sounds bilaterally, no wheezing, rales, rhonchi. No use of accessory muscles of respiration.  Port-a-cath site on chest wall looks normal CARDIOVASCULAR: S1, S2 normal. No murmurs,  rubs, or gallops.  ABDOMEN: Soft, nontender, nondistended. Bowel sounds present. No organomegaly or mass.  EXTREMITIES: No cyanosis, clubbing or edema b/l.    NEUROLOGIC: Cranial nerves II through XII are intact. No focal Motor or sensory deficits b/l.   PSYCHIATRIC: The patient is alert and oriented x 3.  SKIN: No obvious rash, lesion, or ulcer.   LABORATORY PANEL:   CBC  Recent Labs Lab 12/29/16 0556  WBC 5.4  HGB 9.9*  HCT 28.8*  PLT 146*   ------------------------------------------------------------------------------------------------------------------ Chemistries   Recent Labs Lab 12/29/16 0556  NA 136  K 3.7  CL 104  CO2 27  GLUCOSE 99  BUN 8  CREATININE 0.48*  CALCIUM 8.4*   ------------------------------------------------------------------------------------------------------------------  Cardiac Enzymes No results for input(s): TROPONINI in the last 168 hours. ------------------------------------------------------------------------------------------------------------------  RADIOLOGY:  No results found.   ASSESSMENT AND PLAN:   * MRSA bacteremia On IV Vancomycin Blood cx repeated on 12/29/2016 negative thus far - clinically improved - s/p removal of Portacath as recommended by ID - TEE showed no endocarditis - PICC to be placed today after 24 hrs of port removal Can be discharged back to SNF tomorrow with PICC and IV vancomycin for 2 weeks from port removal date.  * A fib    Cardizem, on eliquis.  * Lung cancer   Cont with Oncology after Bacteremia resolved.  * Urinary retention and catheter   Urology has seen patient.  Will remove foley for voiding trial today. On flomax. Reinsert foley if bladder scan >500 -He will likely need to go back with new Foley with outpatient follow-up with urology on Monday.   All the records are reviewed and case discussed with Care Management/Social Worker. Management plans discussed with  the patient, family  (d/w [name] over phone) and they are in agreement.  CODE STATUS: FULL CODE  DVT Prophylaxis: SCDs  TOTAL TIME TAKING CARE OF THIS PATIENT: 35 minutes.   POSSIBLE D/C TOMORROW, DEPENDING ON CLINICAL CONDITION.  Max Sane M.D on 12/31/2016 at 5:02 PM  Between 7am to 6pm - Pager - [phone]  After 6pm go to [email or web address] - password [email or web address] [facility]  Office  [phone]  CC: Primary care physician; Claiborne Billings, MD  Note: This dictation was prepared with Dragon dictation along with smaller phrase technology. Any transcriptional errors that result from this process are unintentional.

## 2016-12-31 NOTE — Progress Notes (Signed)
Palliative:  Frank Cowan is sleeping but easily arouses. No family at bedside. He tells me that he is waiting for his PICC line. He has no complaints. Ate ~ 50% of his lunch but is sleeping most of the day. Goal is to have PICC placed, likely needs foley reinserted, and transition back to [facility] with IV antibiotics for MRSA bacteremia. Planning on moving forward with immunotherapy with Dr. Grayland Ormond if his health allows. Support provided.   15 min  Vinie Sill, NP Palliative Medicine Team Pager # [phone] (M-F 8a-5p) Team Phone # [phone] (Nights/Weekends)

## 2016-12-31 NOTE — Progress Notes (Signed)
PHARMACY CONSULT NOTE FOR:  OUTPATIENT  PARENTERAL ANTIBIOTIC THERAPY (OPAT)  Indication: bacteremia Regimen: vancomycin 1500mg  q 24hr End date: 01/14/17  IV antibiotic discharge orders are pended. To discharging provider:  please sign these orders via discharge navigator,  Select New Orders & click on the button choice - Manage This Unsigned Work.     Thank you for allowing pharmacy to be a part of this patient's care.  Ramond Dial, Pharm.D, BCPS Clinical Pharmacist  12/31/2016, 7:34 AM

## 2016-12-31 NOTE — Progress Notes (Signed)
Spoke with Dr. Erlene Quan regarding foley catheter. Foley was not removed yesterday 9/18 or overnight due to RN confusion. Dr. Erlene Quan stated to remove foley for voiding trial and replace if pt unable to void. Current foley was placed by a urologist but Dr. Erlene Quan stated if foley needs to be replaced to try insertion, if unsuccessful call her.  Will update patient.

## 2016-12-31 NOTE — Progress Notes (Signed)
Peripherally Inserted Central Catheter/Midline Placement  The IV Nurse has discussed with the patient and/or persons authorized to consent for the patient, the purpose of this procedure and the potential benefits and risks involved with this procedure.  The benefits include less needle sticks, lab draws from the catheter, and the patient may be discharged home with the catheter. Risks include, but not limited to, infection, bleeding, blood clot (thrombus formation), and puncture of an artery; nerve damage and irregular heartbeat and possibility to perform a PICC exchange if needed/ordered by physician.  Alternatives to this procedure were also discussed.  B[employer] Power PICC patient education guide, fact sheet on infection prevention and patient information card has been provided to patient /or left at bedside.    PICC/Midline Placement Documentation  PICC Single Lumen 75/91/63 PICC Right Basilic 39 cm 0 cm (Active)  Indication for Insertion or Continuance of Line Home intravenous therapies (PICC only) 12/31/2016  8:37 PM  Exposed Catheter (cm) 0 cm 12/31/2016  8:37 PM  Site Assessment Clean;Dry;Intact 12/31/2016  8:37 PM  Line Status Flushed;Saline locked;Blood return noted 12/31/2016  8:37 PM  Dressing Type Transparent;Securing device 12/31/2016  8:37 PM  Dressing Status Clean;Dry;Intact;Antimicrobial disc in place 12/31/2016  8:37 PM  Dressing Change Due 01/07/17 12/31/2016  8:37 PM       Frances Maywood 12/31/2016, 8:40 PM

## 2016-12-31 NOTE — Progress Notes (Signed)
6 hours post foley catheter removal- pt has no urge to void. Bladder scan 300 ml. Dr. Manuella Ghazi notified wait another 2 hours, if pt still have not voided, insert foley catheter.

## 2016-12-31 NOTE — Progress Notes (Signed)
[place] INFECTIOUS DISEASE PROGRESS NOTE Date of Admission:  12/28/2016     ID: Frank Cowan is a 76 y.o. male with Principal Problem:   Bacteremia   Subjective: No fevers, portacath out  No new complaints  ROS  Eleven systems are reviewed and negative except per hpi  Medications:  Antibiotics Given (last 72 hours)    Date/Time Action Medication Dose Rate   12/28/16 2330 Given   cefdinir (OMNICEF) 125 MG/5ML suspension 300 mg 300 mg    12/29/16 0218 New Bag/Given   vancomycin (VANCOCIN) 1,500 mg in sodium chloride 0.9 % 500 mL IVPB 1,500 mg 250 mL/hr   12/29/16 1451 Given   hydroxychloroquine (PLAQUENIL) tablet 200 mg 200 mg    12/29/16 2203 Given   cefdinir (OMNICEF) 125 MG/5ML suspension 300 mg 300 mg    12/29/16 2203 New Bag/Given   vancomycin (VANCOCIN) 1,500 mg in sodium chloride 0.9 % 500 mL IVPB 1,500 mg 250 mL/hr   12/30/16 2252 Given   cefdinir (OMNICEF) 125 MG/5ML suspension 300 mg 300 mg    12/30/16 2252 New Bag/Given   vancomycin (VANCOCIN) 1,500 mg in sodium chloride 0.9 % 500 mL IVPB 1,500 mg 250 mL/hr   12/31/16 1008 Given   hydroxychloroquine (PLAQUENIL) tablet 200 mg 200 mg    12/31/16 1008 Given   cefdinir (OMNICEF) 125 MG/5ML suspension 300 mg 300 mg      . apixaban  5 mg Oral BID  . cefdinir  300 mg Oral BID  . clonazePAM  0.5 mg Oral BID  . diltiazem  120 mg Oral Daily  . FLUoxetine  40 mg Oral Daily  . gabapentin  100 mg Oral TID  . hydroxychloroquine  200 mg Oral Daily  . tamsulosin  0.4 mg Oral QHS    Objective: Vital signs in last 24 hours: Temp:  [97.6 F (36.4 C)-98.2 F (36.8 C)] 98.1 F (36.7 C) (09/19 0807) Pulse Rate:  [56-135] 135 (09/19 1002) Resp:  [12-26] 18 (09/19 0807) BP: (96-119)/(58-75) 107/67 (09/19 0807) SpO2:  [87 %-99 %] 91 % (09/19 0807) Weight:  [77.7 kg (171 lb 4.8 oz)] 77.7 kg (171 lb 4.8 oz) (09/18 1451) Constitutional: He is oriented to person, place, and time. Thin frail but sitting up in bed. HENT:  anicteric  Mouth/Throat: Oropharynx is clear and moist. No oropharyngeal exudate.  Cardiovascular: Normal rate, regular rhythm and normal heart sounds.  Portath R chest explant site wnl Pulmonary/Chest: Effort normal and breath sounds normal. No respiratory distress. He has no wheezes.  Abdominal: Soft. Bowel sounds are normal. He exhibits no distension. There is no tenderness.  Lymphadenopathy:  He has no cervical adenopathy.  Neurological: He is alert and oriented to person, place, and time.  Skin: Skin is warm and dry. No rash noted. No erythema.  Psychiatric: He has a normal mood and affect. His behavior is normal.   Lab Results  Recent Labs  12/28/16 1626 12/29/16 0556  WBC 6.1 5.4  HGB 9.5* 9.9*  HCT 28.5* 28.8*  NA 137 136  K 3.9 3.7  CL 105 104  CO2 27 27  BUN 9 8  CREATININE 0.46* 0.48*    Microbiology: Critical results called to [facility]/13/18 at 9:13 PM. Results read back and acknowledged. Critical result called to [facility] [place]/ms. 9/15/20181:52 PM Results were read back and verified.   Organism Antibiotic Method Susceptibility  Methicillin resistant Staphylococcus aureus Ciprofloxacin MIC SUSCEPTIBILITY RESULT Resistant   Clindamycin MIC SUSCEPTIBILITY RESULT Resistant   Erythromycin  MIC SUSCEPTIBILITY RESULT Resistant   Gentamicin MIC SUSCEPTIBILITY RESULT Susceptible   Levofloxacin MIC SUSCEPTIBILITY RESULT Resistant   Linezolid MIC SUSCEPTIBILITY RESULT Susceptible   Minocycline MIC SUSCEPTIBILITY RESULT    Moxifloxacin MIC SUSCEPTIBILITY RESULT Resistant   Nitrofurantoin MIC SUSCEPTIBILITY RESULT Susceptible   Oxacillin MIC SUSCEPTIBILITY RESULT Resistant   Comment: Oxacillin=R, update isolate name to MRSA    Quinupristin + Dalfopristin MIC SUSCEPTIBILITY RESULT Susceptible   Rifampin MIC SUSCEPTIBILITY RESULT Susceptible   Tigecycline MIC SUSCEPTIBILITY RESULT Susceptible   Trimethoprim + Sulfamethoxazole MIC SUSCEPTIBILITY RESULT  Susceptible   Tetracycline MIC SUSCEPTIBILITY RESULT Susceptible   Vancomycin MIC SUSCEPTIBILITY RESULT Susceptible   9/13/ UCX        Urine Culture, Comprehensive >100,000 CFU/mL Acinetobacter baumannii complex (A)   Specimen      Urine      Result Narrative      Specimen Source: Urine      Organism Antibiotic Method Susceptibility  Acinetobacter baumannii complex Ampicillin MIC SUSCEPTIBILITY RESULT Resistant   Ampicillin + Sulbactam MIC SUSCEPTIBILITY RESULT Susceptible   Aztreonam MIC SUSCEPTIBILITY RESULT Intermediate   Cefazolin MIC SUSCEPTIBILITY RESULT Resistant   Cefepime MIC SUSCEPTIBILITY RESULT Susceptible   Ceftriaxone MIC SUSCEPTIBILITY RESULT Susceptible   Ciprofloxacin MIC SUSCEPTIBILITY RESULT Susceptible   Gentamicin MIC SUSCEPTIBILITY RESULT Susceptible   Imipenem MIC SUSCEPTIBILITY RESULT Susceptible   Meropenem MIC SUSCEPTIBILITY RESULT Susceptible   Moxifloxacin MIC SUSCEPTIBILITY RESULT Susceptible   Nitrofurantoin MIC SUSCEPTIBILITY RESULT Resistant   Tigecycline MIC SUSCEPTIBILITY RESULT Susceptible   Tobramycin MIC SUSCEPTIBILITY RESULT Susceptible   Trimethoprim + Sulfamethoxazole MIC SUSCEPTIBILITY RESULT Susceptible    Studies/Results: No results found.  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  Apparently picc had been placed 8/27  for treatment of aspiration PNA. Also has UTI with acinetobacter, has recently placed indwelling foley for BPH.  Clinically improved, TEE neg, BCX fu 9/16 ngtd  S/p removal of Portacath  Will rec 14 days of IV Vanco.   Recommendations Removal of Portacath done 9/18 Once stable can dc on 2 weeks IV vanco from date cath removed - see abx order sheet Has seen urology who rec possible voiding trial which I think would be great if we can get the foley  out.    If cannot be removed will need it changed. Otherwise continue cefdinir for a total of 10 day for the acinetobacter in [place].   Thank you very much for the consult. Will follow with you.  [name]   12/31/2016, 10:21 AM

## 2017-01-01 DIAGNOSIS — Z515 Encounter for palliative care: Secondary | ICD-10-CM

## 2017-01-01 LAB — BASIC METABOLIC PANEL
Anion gap: 4 — ABNORMAL LOW (ref 5–15)
BUN: 11 mg/dL (ref 6–20)
CHLORIDE: 104 mmol/L (ref 101–111)
CO2: 30 mmol/L (ref 22–32)
CREATININE: 0.48 mg/dL — AB (ref 0.61–1.24)
Calcium: 8.2 mg/dL — ABNORMAL LOW (ref 8.9–10.3)
GFR calc Af Amer: >60 mL/min (ref >60)
GFR calc non Af Amer: >60 mL/min (ref >60)
Glucose, Bld: 101 mg/dL — ABNORMAL HIGH (ref 65–99)
POTASSIUM: 3.6 mmol/L (ref 3.5–5.1)
SODIUM: 138 mmol/L (ref 135–145)

## 2017-01-01 LAB — CBC
HEMATOCRIT: 27.8 % — AB (ref 40.0–52.0)
HEMOGLOBIN: 9.6 g/dL — AB (ref 13.0–18.0)
MCH: 32.1 pg (ref 26.0–34.0)
MCHC: 34.7 g/dL (ref 32.0–36.0)
MCV: 92.6 fL (ref 80.0–100.0)
Platelets: 156 10*3/uL (ref 150–440)
RBC: 3 MIL/uL — AB (ref 4.40–5.90)
RDW: 14.6 % — ABNORMAL HIGH (ref 11.5–14.5)
WBC: 5 10*3/uL (ref 3.8–10.6)

## 2017-01-01 MED ORDER — OXYCODONE-ACETAMINOPHEN 5-325 MG PO TABS: 1.0000 | ORAL_TABLET | Freq: Four times a day (QID) | ORAL | 0 refills | Status: DC | PRN

## 2017-01-01 MED ORDER — DILTIAZEM HCL ER COATED BEADS 120 MG PO CP24: 120.0000 mg | ORAL_CAPSULE | Freq: Every day | ORAL | Status: AC

## 2017-01-01 MED ORDER — CEFDINIR 125 MG/5ML PO SUSR: 300.0000 mg | Freq: Two times a day (BID) | ORAL | 0 refills | Status: AC

## 2017-01-01 MED ORDER — VANCOMYCIN HCL 10 G IV SOLR: 1500.0000 mg | INTRAVENOUS | Status: AC

## 2017-01-01 MED ORDER — NAPROXEN 250 MG PO TABS: 250.0000 mg | ORAL_TABLET | Freq: Two times a day (BID) | ORAL | 0 refills | Status: DC | PRN

## 2017-01-01 MED ORDER — CLONAZEPAM 0.5 MG PO TABS: 0.5000 mg | ORAL_TABLET | Freq: Two times a day (BID) | ORAL | 0 refills | Status: AC

## 2017-01-01 MED ORDER — VANCOMYCIN IV (FOR PTA / DISCHARGE USE ONLY): 1500.0000 mg | INTRAVENOUS | 0 refills | Status: AC

## 2017-01-01 NOTE — Care Management Important Message (Signed)
Important Message  Patient Details  Name: Frank Cowan MRN: [id number] Date of Birth: 04-14-1941   Medicare Important Message Given:  Yes    Beverly Sessions, RN 01/01/2017, 11:21 AM

## 2017-01-01 NOTE — Progress Notes (Signed)
Patient discharged per MD order. PICC in place and flushed. Foley in place and draining. Report given to [facility] RN at facility. Prescriptions given to daughter who will transport patient to facility.

## 2017-01-01 NOTE — Progress Notes (Deleted)
01/05/2017 9:53 AM   Frank Cowan 1941/01/24 [id number]  Referring provider: Claiborne Billings, MD [facility] [place], [place] [address]  No chief complaint on file.   HPI: Patient is a 76 year old Caucasian male with urinary retention who presents today to discuss Foley catheter management.    Background history Hx:  Frank Cowan is a 76 yo WM who was admitted for an infected PICC line.   He had been in rehab following an aspiration pneumonia.   He has a history of urinary retention that has been managed with a foley for the past month and I was asked to see him in consultation by Dr. Anselm Cowan.   The patient is on tamsulosin but it is not clear whether that has just been since the urinary retention or prior.  He had some voiding complaints prior to developing retention.   He is tolerating the foley well.  His GU history is significant for a right PCNL about 2 years ago in [place] and he has passed other stones.  He has had no prostate procedures.     Reviewed referral notes.    Today, ***.     PMH: Past Medical History:  Diagnosis Date  . Atrial fibrillation ([place])   . Bacteremia   . Cancer ([place])    lung  . Cataract   . Collagen vascular disease ([place])   . History of kidney stones   . Kidney stones 2014  . Pulmonary emboli ([place])   . 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 CYSTOSCOPY W/ URETEROSCOPY W/ LITHOTRIPSY    . ENDOBRONCHIAL ULTRASOUND N/A 06/12/2016   Procedure: ENDOBRONCHIAL ULTRASOUND;  Surgeon: Frank Hobby, MD;  Location: [facility] ORS;  Service: Pulmonary;  Laterality: N/A;  . PORTA Cowan INSERTION N/A 06/26/2016   Procedure: Frank Cowan Insertion;  Surgeon: Frank Huxley, MD;  Location: [facility];  Service: Cardiovascular;  Laterality: N/A;  . PORTA Cowan REMOVAL Right 12/30/2016   Procedure: Frank Cowan Removal;  Surgeon: Frank Cabal, MD;  Location: [facility];  Service: Cardiovascular;  Laterality: Right;  . TEE WITHOUT CARDIOVERSION N/A 12/29/2016   Procedure: TRANSESOPHAGEAL ECHOCARDIOGRAM (TEE);  Surgeon: Frank Spray, MD;  Location: [facility];  Service: Cardiovascular;  Laterality: N/A;  . TONSILLECTOMY      Home Medications:  Allergies as of 01/05/2017   No Known Allergies     Medication List    Notice   This visit is during an admission. Changes to the med list made in this visit will be reflected in the After Visit Summary of the admission.     Allergies: No Known Allergies  Family History: Family History  Problem Relation Age of Onset  . Heart disease Mother   . Heart disease Father   . Cancer Frank Cowan 4       luekemia    Social History:  reports that he quit smoking about 9 months ago. He has a 27.50 pack-year smoking history. He has quit using smokeless tobacco. He reports that he does not drink alcohol or use drugs.  ROS:                                        Physical Exam: There were no vitals taken for this visit.  Constitutional:  Well nourished. Alert and oriented, No acute distress. HEENT: [place] AT, moist mucus membranes. Trachea midline, no masses. Cardiovascular: No clubbing, cyanosis, or edema. Respiratory: Normal respiratory effort, no increased work of breathing. GI: Abdomen is soft, non tender, non distended, no abdominal masses. Liver and spleen not palpable.  No hernias appreciated.  Stool sample for occult testing is not indicated.   GU: No CVA tenderness.  No bladder fullness or masses.  Patient with circumcised/uncircumcised phallus. ***Foreskin easily retracted***  Urethral meatus is patent.  No penile discharge. No penile lesions or rashes. Scrotum without lesions, cysts, rashes and/or edema.  Testicles are located scrotally bilaterally. No masses are appreciated in the testicles. Left and right epididymis are normal. Rectal: Patient with  normal sphincter tone.  Anus and perineum without scarring or rashes. No rectal masses are appreciated. Prostate is approximately *** grams, *** nodules are appreciated. Seminal vesicles are normal. Skin: No rashes, bruises or suspicious lesions. Lymph: No cervical or inguinal adenopathy. Neurologic: Grossly intact, no focal deficits, moving all 4 extremities. Psychiatric: Normal mood and affect.  Laboratory Data: Lab Results  Component Value Date   WBC 5.0 01/01/2017   HGB 9.6 (L) 01/01/2017   HCT 27.8 (L) 01/01/2017   MCV 92.6 01/01/2017   PLT 156 01/01/2017    Lab Results  Component Value Date   CREATININE 0.48 (L) 01/01/2017    Lab Results  Component Value Date   TSH 0.153 (L) 11/16/2016    Lab Results  Component Value Date   AST 59 (H) 09/25/2016   Lab Results  Component Value Date   ALT 67 (H) 09/25/2016    I have reviewed the labs.    Assessment & Plan:    1. Urinary retention  - foley catheter removed  -voiding trial today    -return if unable to urinate or experiencing suprapubic discomfort  -follow-up in one month for I PSS score, PVR and exam.     No Follow-up on file.  These notes generated with voice recognition software. I apologize for typographical errors.  Frank Cowan, [facility] [address] [place], [place] [address] [phone]

## 2017-01-01 NOTE — Discharge Summary (Signed)
[facility] NAME: Frank Cowan    MR#:  [id number]  DATE OF BIRTH:  1940-08-31  DATE OF ADMISSION:  12/28/2016   ADMITTING PHYSICIAN: Bettey Costa, MD  DATE OF DISCHARGE: 01/01/2017  PRIMARY CARE PHYSICIAN: Claiborne Billings, MD   ADMISSION DIAGNOSIS:  RMSA bacteremia bactaremia MRSA bacteremia DISCHARGE DIAGNOSIS:  Principal Problem:   Bacteremia  SECONDARY DIAGNOSIS:   Past Medical History:  Diagnosis Date  . Atrial fibrillation ([place])   . Bacteremia   . Cancer ([place])    lung  . Cataract   . Collagen vascular disease ([place])   . History of kidney stones   . Kidney stones 2014  . Pulmonary emboli ([place])   . 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  * Lungcancer Cont with Oncology after Bacteremia resolved.  * Urinary retention and catheter - changed on 12/31/2016 -He will go as an outpatient to follow-up with urology on Monday for voiding trial.  DISCHARGE CONDITIONS:  stable CONSULTS OBTAINED:  Treatment Team:  Corey Skains, MD Irine Seal, MD [name], MD Schnier, Dolores Lory, MD DRUG ALLERGIES:  No Known Allergies DISCHARGE MEDICATIONS:   Allergies as of 01/01/2017   No Known Allergies     Medication List    STOP taking these medications   cefdinir 300 MG capsule Commonly known as:  OMNICEF Replaced by:  cefdinir 125 MG/5ML suspension   ciprofloxacin 750 MG tablet Commonly known as:  CIPRO   diltiazem 30 MG tablet Commonly known as:  CARDIZEM   naproxen sodium 220 MG tablet Commonly known as:  ANAPROX Replaced by:  naproxen 250 MG tablet     TAKE these medications   acetaminophen 500 MG  tablet Commonly known as:  TYLENOL Take 500 mg by mouth every 8 (eight) hours as needed for mild pain or headache.   apixaban 5 MG Tabs tablet Commonly known as:  ELIQUIS Take 2 tabs twice a day till 11/23/16 and then take 1 tab (5 mg ) twice a day   bisacodyl 5 MG EC tablet Commonly known as:  DULCOLAX Take 5 mg by mouth daily as needed for moderate constipation.   cefdinir 125 MG/5ML suspension Commonly known as:  OMNICEF Take 12 mLs (300 mg total) by mouth 2 (two) times daily. Replaces:  cefdinir 300 MG capsule   clonazePAM 0.5 MG tablet Commonly known as:  KLONOPIN Take 1 tablet (0.5 mg total) by mouth 2 (two) times daily.   DECUBI-VITE Caps Take 1 tablet by mouth daily.   dexamethasone 4 MG tablet Commonly known as:  DECADRON Take 1 tablet by mouth daily.   diclofenac sodium 1 % Gel Commonly known as:  VOLTAREN Apply topically 4 (four) times daily.   diltiazem 120 MG 24 hr capsule Commonly known as:  CARDIZEM CD Take 1 capsule (120 mg total) by mouth daily.   esomeprazole 40 MG capsule Commonly known as:  NEXIUM Take 1 capsule (40 mg total) by mouth daily.   FLUoxetine 40 MG capsule Commonly known as:  PROZAC Take 40 mg by mouth daily. What changed:  Another medication with the same name was removed. Continue taking this medication, and follow the directions  you see here.   folic acid 1 MG tablet Commonly known as:  FOLVITE Take 1 mg by mouth daily.   gabapentin 100 MG capsule Commonly known as:  NEURONTIN Take 100 mg by mouth 3 (three) times daily.   hydroxychloroquine 200 MG tablet Commonly known as:  PLAQUENIL Take 200 mg by mouth daily.   lidocaine 2 % solution Commonly known as:  XYLOCAINE Use as directed 20 mLs in the mouth or throat as needed for mouth pain.   methotrexate 2.5 MG tablet Take 20 mg by mouth once a week.   naproxen 250 MG tablet Commonly known as:  NAPROSYN Take 1 tablet (250 mg total) by mouth 2 (two) times daily as needed  for mild pain or moderate pain. Replaces:  naproxen sodium 220 MG tablet   oxyCODONE-acetaminophen 5-325 MG tablet Commonly known as:  PERCOCET/ROXICET Take 1 tablet by mouth every 6 (six) hours as needed for severe pain. What changed:  how much to take   potassium chloride SA 20 MEQ tablet Commonly known as:  K-DUR,KLOR-CON Take 1 tablet by mouth 2 (two) times daily.   tamsulosin 0.4 MG Caps capsule Commonly known as:  FLOMAX Take 0.4 mg by mouth at bedtime.   vancomycin 1,500 mg in sodium chloride 0.9 % 500 mL Inject 1,500 mg into the vein daily.   vancomycin IVPB Inject 1,500 mg into the vein daily. Indication:  Bacteremia Last Day of Therapy:  01/14/17 Labs - Sunday/Monday:  CBC/D, BMP, and vancomycin trough. Labs - Thursday:  BMP and vancomycin trough Labs - Every other week:  ESR and CRP            Home Infusion Instuctions        Start     Ordered   01/01/17 0000  Home infusion instructions [facility] May follow [facility] Dosing Protocol; May administer Cathflo as needed to maintain patency of vascular access device.; Flushing of vascular access device: per [facility] Protocol: 0.9% NaCl pre/post medica...    Question Answer Comment  Instructions May follow [facility] Dosing Protocol   Instructions May administer Cathflo as needed to maintain patency of vascular access device.   Instructions Flushing of vascular access device: per [facility] Protocol: 0.9% NaCl pre/post medication administration and prn patency; Heparin 100 u/ml, 5ml for implanted ports and Heparin 10u/ml, 5ml for all other central venous catheters.   Instructions May follow [facility] Anaphylaxis Protocol for First Dose Administration in the home: 0.9% NaCl at 25-50 ml/hr to maintain IV access for protocol meds. Epinephrine 0.3 ml IV/IM PRN and Benadryl 25-50 IV/IM PRN s/s of anaphylaxis.   Instructions [facility] Infusion Coordinator (RN) to assist per patient IV care needs in the home PRN.       09 /20/18 0726       Discharge Care Instructions        Start     Ordered   01/01/17 0000  Home infusion instructions [facility] May follow [facility] Dosing Protocol; May administer Cathflo as needed to maintain patency of vascular access device.; Flushing of vascular access device: per [facility] Protocol: 0.9% NaCl pre/post medica...    Question Answer Comment  Instructions May follow [facility] Dosing Protocol   Instructions May administer Cathflo as needed to maintain patency of vascular access device.   Instructions Flushing of vascular access device: per [facility] Protocol: 0.9% NaCl pre/post medication administration and prn patency; Heparin 100 u/ml, 19m for implanted ports and Heparin 10u/ml, 551mfor all  other central venous catheters.   Instructions May follow [facility] Anaphylaxis Protocol for First Dose Administration in the home: 0.9% NaCl at 25-50 ml/hr to maintain IV access for protocol meds. Epinephrine 0.3 ml IV/IM PRN and Benadryl 25-50 IV/IM PRN s/s of anaphylaxis.   Instructions [facility] Infusion Coordinator (RN) to assist per patient IV care needs in the home PRN.      01/01/17 0726   01/01/17 0000  vancomycin IVPB  Every 24 hours     01/01/17 0726   01/01/17 0000  cefdinir (OMNICEF) 125 MG/5ML suspension  2 times daily     01/01/17 0726   01/01/17 0000  clonazePAM (KLONOPIN) 0.5 MG tablet  2 times daily     01/01/17 0726   01/01/17 0000  naproxen (NAPROSYN) 250 MG tablet  2 times daily PRN     01/01/17 0726   01/01/17 0000  oxyCODONE-acetaminophen (PERCOCET/ROXICET) 5-325 MG tablet  Every 6 hours PRN     01/01/17 0726   01/01/17 0000  diltiazem (CARDIZEM CD) 120 MG 24 hr capsule  Daily     01/01/17 0726   01/01/17 0000  vancomycin 1,500 mg in sodium chloride 0.9 % 500 mL  Every 24 hours     01/01/17 0726   01/01/17 0000  Increase activity slowly     01/01/17 0726   01/01/17 0000  Diet - low sodium heart healthy     01/01/17 0726   01/01/17 0000  Discharge  instructions    Comments:  Vancomycin           1500         mg  every    24           hours .     Goal vancomycin trough 15-20.    Pharmacy to adjust dosing based on levels  PICC Care per protocol Labs weekly while on IV antibiotics      CBC w diff         Comprehensive met panel Vancomycin Trough      Planned duration of antibiotics 2 weeks from removal of portacath  Stop date        Oct 3    Follow up clinic date            2 weeks            FAX weekly labs to [phone]   01/01/17 0726     DISCHARGE INSTRUCTIONS:  Vancomycin           1500         mg  every    24           hours .     Goal vancomycin trough 15-20.    Pharmacy to adjust dosing based on levels  PICC Care per protocol Labs weekly while on IV antibiotics      CBC w diff         Comprehensive met panel Vancomycin Trough      Planned duration of antibiotics 2 weeks from removal of portacath  Stop date        Oct 3    Follow up clinic date            2 weeks            FAX weekly labs to [phone] DIET:  Regular diet DISCHARGE CONDITION:  Good ACTIVITY:  Activity as tolerated OXYGEN:  Home Oxygen: No.  Oxygen Delivery: room air DISCHARGE LOCATION:  nursing home   If you experience worsening of your admission symptoms, develop shortness of breath, life threatening emergency, suicidal or homicidal thoughts you must seek medical attention immediately by calling 911 or calling your MD immediately  if symptoms less severe.  You Must read complete instructions/literature along with all the possible adverse reactions/side effects for all the Medicines you take and that have been prescribed to you. Take any new Medicines after you have completely understood and accpet all the possible adverse reactions/side effects.   Please note  You were cared for by a hospitalist during your hospital stay. If you have any questions about your discharge medications or the care you received while you were in the  hospital after you are discharged, you can call the unit and asked to speak with the hospitalist on call if the hospitalist that took care of you is not available. Once you are discharged, your primary care physician will handle any further medical issues. Please note that NO REFILLS for any discharge medications will be authorized once you are discharged, as it is imperative that you return to your primary care physician (or establish a relationship with a primary care physician if you do not have one) for your aftercare needs so that they can reassess your need for medications and monitor your lab values.    On the day of Discharge:  VITAL SIGNS:  Blood pressure 104/63, pulse 90, temperature 98.1 F (36.7 C), temperature source Oral, resp. rate 20, height 6' (1.829 m), weight 77.7 kg (171 lb 4.8 oz), SpO2 95 %. PHYSICAL EXAMINATION:  GENERAL:  76 y.o.-year-old patient lying in the bed with no acute distress.  EYES: Pupils equal, round, reactive to light and accommodation. No scleral icterus. Extraocular muscles intact.  HEENT: Head atraumatic, normocephalic. Oropharynx and nasopharynx clear.  NECK:  Supple, no jugular venous distention. No thyroid enlargement, no tenderness.  LUNGS: Normal breath sounds bilaterally, no wheezing, rales,rhonchi or crepitation. No use of accessory muscles of respiration.  CARDIOVASCULAR: S1, S2 normal. No murmurs, rubs, or gallops.  ABDOMEN: Soft, non-tender, non-distended. Bowel sounds present. No organomegaly or mass.  EXTREMITIES: No pedal edema, cyanosis, or clubbing.  NEUROLOGIC: Cranial nerves II through XII are intact. Muscle strength 5/5 in all extremities. Sensation intact. Gait not checked.  PSYCHIATRIC: The patient is alert and oriented x 3.  SKIN: No obvious rash, lesion, or ulcer.  DATA REVIEW:   CBC  Recent Labs Lab 01/01/17 0505  WBC 5.0  HGB 9.6*  HCT 27.8*  PLT 156    Chemistries   Recent Labs Lab 01/01/17 0505  NA 138  K 3.6   CL 104  CO2 30  GLUCOSE 101*  BUN 11  CREATININE 0.48*  CALCIUM 8.2*      Contact information for follow-up providers    Claiborne Billings, MD. Schedule an appointment as soon as possible for a visit in 1 week(s).   Specialty:  Family Medicine Contact information: [address]  [address] [phone]            Contact information for after-discharge care    Destination    [facility] Follow up.   Specialty:  [place] Contact information: [address] [place] [address] [phone]                   RADIOLOGY:  Dg Chest Port 1 View  Result Date: 12/31/2016 CLINICAL DATA:  Central catheter placement there history of squamous  cell lung carcinoma EXAM: PORTABLE CHEST 1 VIEW COMPARISON:  December 28, 2016 chest radiograph and chest CT January 11, 2017. FINDINGS: Port-A-Cath has been removed. Central catheter tip is in the superior vena cava. No pneumothorax. 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 There is persistent airspace consolidation throughout the left perihilar and inferior left upper lobe regions, likely due to a combination of scarring and airspace consolidations/pneumonia. A degree of underlying mass in the left upper lobe cannot be excluded radiographically. There is a small left pleural effusion with atelectatic change in the left base. There has been interval resolution of right pleural effusion. No new opacity evident. Heart is upper normal in size with pulmonary vascularity within normal limits. There is aortic atherosclerosis. Bones are osteoporotic. Adenopathy cannot be excluded in the left hilar region by radiography. IMPRESSION: 1.  Central catheter tip in superior vena cava.  No pneumothorax. 2.  Status post radiation fibrosis right perihilar region. 3. Combination of scarring and airspace consolidation left upper lobe/perihilar regions. A degree of  underlying neoplasm in this area cannot be excluded. 4. Small left pleural effusion with left base atelectatic change. Interval clearing right pleural effusion. 5.  Aortic atherosclerosis. 6.  Bones osteoporotic. Aortic Atherosclerosis (ICD10-I70.0). Electronically Signed   By: Lowella Grip III M.D.   On: 12/31/2016 21:24     Management plans discussed with the patient, family and they are in agreement.  CODE STATUS: Full Code   TOTAL TIME TAKING CARE OF THIS PATIENT: 45 minutes.    Max Sane M.D on 01/01/2017 at 7:28 AM  Between 7am to 6pm - Pager - [phone]  After 6pm go to [email or web address] - Proofreader  [facility]  Office  [phone]  CC: Primary care physician; Claiborne Billings, MD   Note: This dictation was prepared with Dragon dictation along with smaller phrase technology. Any transcriptional errors that result from this process are unintentional.

## 2017-01-01 NOTE — Clinical Social Work Note (Addendum)
Patient is to discharge today to return to [facility]. Patient's daughter is here and wishes to transport. She does not have a portable oxygen tank and states that she transported patient last week several times and did not use oxygen. She states that in order to get a portable tank, she would have to go to the facility which is 45 minutes away and come back to get her father, then take him back to facility. Patient's daughter asked if she could transport patient without the oxygen. CSW advised that it would be her decision but that at the hospital patient has been on continuous 3 liters. Patient's daughter has decided to transport patient via car without the oxygen. CSW contacted admissions at [facility] and informed them that discharge information had been sent and that patient will be on IV ABX with a PICC line.  Shela Leff MSW,LCSW [phone]

## 2017-01-02 LAB — CULTURE, BLOOD (ROUTINE X 2)
Culture: NO GROWTH
Culture: NO GROWTH
SPECIAL REQUESTS: ADEQUATE
Special Requests: ADEQUATE

## 2017-01-03 LAB — CATH TIP CULTURE: CULTURE: NO GROWTH

## 2017-01-05 ENCOUNTER — Ambulatory Visit: Payer: Self-pay | Admitting: Urology

## 2017-01-12 NOTE — Progress Notes (Signed)
01/13/2017 10:12 AM   Frank Cowan 1940-10-10 [id number]  Referring provider: Claiborne Billings, MD No address on file  Chief Complaint  Patient presents with  . New Patient (Initial Visit)    Retention has foley family wants to discuss removing referred by Dr. Grayland Ormond    HPI: Patient is a 76 year old Caucasian male with urinary retention who presents today to discuss Foley catheter management.    Background history Hx:  [name] is a 76 yo WM who was admitted for an infected PICC line.   He had been in rehab following an aspiration pneumonia.   He has a history of urinary retention that has been managed with a foley for the past month and I was asked to see him in consultation by Dr. Anselm Jungling.   The patient is on tamsulosin but it is not clear whether that has just been since the urinary retention or prior.  He had some voiding complaints prior to developing retention.   He is tolerating the foley well.  His GU history is significant for a right PCNL about 2 years ago in [place] and he has passed other stones.  He has had no prostate procedures.     Today, he is complaining of frequency, urgency, nocturia and straining to urinate. These are prior to having the Foley catheter placed. He did fail a voiding trial during his hospital stay.   He has been on the tamsulosin for 2 years.  He has not had any recent fevers, chills, nausea or vomiting.   He has not had recent hematuria.     His daughter is wondering if there is anything else we can try an effort to help her father void.  PMH: Past Medical History:  Diagnosis Date  . Atrial fibrillation ([place])   . Bacteremia   . Cancer ([place])    lung  . Cataract   . Collagen vascular disease ([place])   . History of kidney stones   . Kidney stones 2014  . Pulmonary emboli ([place])   . 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 CYSTOSCOPY W/ URETEROSCOPY W/  LITHOTRIPSY    . ENDOBRONCHIAL ULTRASOUND N/A 06/12/2016   Procedure: ENDOBRONCHIAL ULTRASOUND;  Surgeon: Laverle Hobby, MD;  Location: [facility] ORS;  Service: Pulmonary;  Laterality: N/A;  . PORTA CATH INSERTION N/A 06/26/2016   Procedure: Glori Luis Cath Insertion;  Surgeon: Algernon Huxley, MD;  Location: [place] [facility];  Service: Cardiovascular;  Laterality: N/A;  . PORTA CATH REMOVAL Right 12/30/2016   Procedure: Glori Luis Cath Removal;  Surgeon: Katha Cabal, MD;  Location: [place] [facility];  Service: Cardiovascular;  Laterality: Right;  . TEE WITHOUT CARDIOVERSION N/A 12/29/2016   Procedure: TRANSESOPHAGEAL ECHOCARDIOGRAM (TEE);  Surgeon: Teodoro Spray, MD;  Location: [facility] ORS;  Service: Cardiovascular;  Laterality: N/A;  . TONSILLECTOMY      Home Medications:  Allergies as of 01/13/2017   No Known Allergies     Medication List       Accurate as of 01/13/17 10:12 AM. Always use your most recent med list.          acetaminophen 500 MG tablet Commonly known as:  TYLENOL Take 500 mg by mouth every 8 (eight) hours as needed for mild pain or headache.   apixaban 5 MG Tabs tablet Commonly known as:  ELIQUIS Take 2 tabs twice a day till 11/23/16 and  then take 1 tab (5 mg ) twice a day   bisacodyl 5 MG EC tablet Commonly known as:  DULCOLAX Take 5 mg by mouth daily as needed for moderate constipation.   clonazePAM 0.5 MG tablet Commonly known as:  KLONOPIN Take 1 tablet (0.5 mg total) by mouth 2 (two) times daily.   DECUBI-VITE Caps Take 1 tablet by mouth daily.   dexamethasone 4 MG tablet Commonly known as:  DECADRON Take 1 tablet by mouth daily.   diclofenac sodium 1 % Gel Commonly known as:  VOLTAREN Apply topically 4 (four) times daily.   diltiazem 120 MG 24 hr capsule Commonly known as:  CARDIZEM CD Take 1 capsule (120 mg total) by mouth daily.   esomeprazole 40 MG capsule Commonly known as:  NEXIUM Take 1 capsule (40 mg total) by mouth daily.     finasteride 5 MG tablet Commonly known as:  PROSCAR Take 1 tablet (5 mg total) by mouth daily.   FLUoxetine 40 MG capsule Commonly known as:  PROZAC Take 40 mg by mouth daily.   folic acid 1 MG tablet Commonly known as:  FOLVITE Take 1 mg by mouth daily.   gabapentin 100 MG capsule Commonly known as:  NEURONTIN Take 100 mg by mouth 3 (three) times daily.   hydroxychloroquine 200 MG tablet Commonly known as:  PLAQUENIL Take 200 mg by mouth daily.   lidocaine 2 % solution Commonly known as:  XYLOCAINE Use as directed 20 mLs in the mouth or throat as needed for mouth pain.   methotrexate 2.5 MG tablet Take 20 mg by mouth once a week.   naproxen 250 MG tablet Commonly known as:  NAPROSYN Take 1 tablet (250 mg total) by mouth 2 (two) times daily as needed for mild pain or moderate pain.   ondansetron 4 MG/2ML Soln injection Commonly known as:  ZOFRAN Inject 4 mg into the vein.   oxyCODONE-acetaminophen 5-325 MG tablet Commonly known as:  PERCOCET/ROXICET Take 1 tablet by mouth every 6 (six) hours as needed for severe pain.   OXYGEN 3 L by Does not apply route.   potassium chloride SA 20 MEQ tablet Commonly known as:  K-DUR,KLOR-CON Take 1 tablet by mouth 2 (two) times daily.   tamsulosin 0.4 MG Caps capsule Commonly known as:  FLOMAX Take 1 capsule (0.4 mg total) by mouth at bedtime.   vancomycin 1,500 mg in sodium chloride 0.9 % 500 mL Inject 1,500 mg into the vein daily.   vancomycin IVPB Inject 1,500 mg into the vein daily. Indication:  Bacteremia Last Day of Therapy:  01/14/17 Labs - Sunday/Monday:  CBC/D, BMP, and vancomycin trough. Labs - Thursday:  BMP and vancomycin trough Labs - Every other week:  ESR and CRP       Allergies: No Known Allergies  Family History: Family History  Problem Relation Age of Onset  . Heart disease Mother   . Heart disease Father   . Cancer Sister 6       luekemia  . Prostate cancer Neg Hx   . Kidney cancer Neg Hx   .  Bladder Cancer Neg Hx     Social History:  reports that he quit smoking about 9 months ago. He has a 27.50 pack-year smoking history. He has quit using smokeless tobacco. He reports that he does not drink alcohol or use drugs.  ROS: UROLOGY Frequent Urination?: Yes Hard to postpone urination?: Yes Burning/pain with urination?: No Get up at night to urinate?: Yes Leakage  of urine?: No Urine stream starts and stops?: No Trouble starting stream?: No Do you have to strain to urinate?: Yes Blood in urine?: Yes Urinary tract infection?: Yes Sexually transmitted disease?: No Injury to kidneys or bladder?: No Painful intercourse?: No Weak stream?: No Erection problems?: No Penile pain?: No  Gastrointestinal Nausea?: No Vomiting?: No Indigestion/heartburn?: No Diarrhea?: Yes Constipation?: Yes  Constitutional Fever: No Night sweats?: No Weight loss?: Yes Fatigue?: Yes  Skin Skin rash/lesions?: No Itching?: No  Eyes Blurred vision?: No Double vision?: No  Ears/Nose/Throat Sore throat?: No Sinus problems?: No  Hematologic/Lymphatic Swollen glands?: No Easy bruising?: Yes  Cardiovascular Leg swelling?: No Chest pain?: No  Respiratory Cough?: Yes Shortness of breath?: Yes  Endocrine Excessive thirst?: No  Musculoskeletal Back pain?: No Joint pain?: Yes  Neurological Headaches?: No Dizziness?: No  Psychologic Depression?: No Anxiety?: No  Physical Exam: BP 99/67   Pulse 93   Ht 6' (1.829 m)   Wt 169 lb (76.7 kg)   BMI 22.92 kg/m   Constitutional: Well nourished. Alert and oriented, No acute distress. HEENT: [place] AT, moist mucus membranes. Trachea midline, no masses. Cardiovascular: No clubbing, cyanosis, or edema. Respiratory: Normal respiratory effort, no increased work of breathing. GI: Abdomen is soft, non tender, non distended, no abdominal masses. Liver and spleen not palpable.  No hernias appreciated.  Stool sample for occult testing is  not indicated.   GU: No CVA tenderness.  No bladder fullness or masses.  Patient with uncircumcised phallus.  Foreskin easily retracted Urethral meatus is patent.  No penile discharge. No penile lesions or rashes. Scrotum without lesions, cysts, rashes and/or edema.  Testicles are located scrotally bilaterally. No masses are appreciated in the testicles. Left and right epididymis are normal. Rectal: Deferred. Skin: No rashes, bruises or suspicious lesions. Lymph: No cervical or inguinal adenopathy. Neurologic: Grossly intact, no focal deficits, moving all 4 extremities. Psychiatric: Normal mood and affect.  Laboratory Data: Lab Results  Component Value Date   WBC 5.0 01/01/2017   HGB 9.6 (L) 01/01/2017   HCT 27.8 (L) 01/01/2017   MCV 92.6 01/01/2017   PLT 156 01/01/2017    Lab Results  Component Value Date   CREATININE 0.48 (L) 01/01/2017    Lab Results  Component Value Date   TSH 0.153 (L) 11/16/2016    Lab Results  Component Value Date   AST 59 (H) 09/25/2016   Lab Results  Component Value Date   ALT 67 (H) 09/25/2016    I have reviewed the labs.    Assessment & Plan:    1. Urinary retention  - patient successfully passed a fill and pull this morning  - he is advised to RTC if he is unable to void or is experiencing suprapubic uncomfortableness  2. BPH with LUTS  - Continue conservative management, avoiding bladder irritants and timed voiding's  - Discuss with patient and daughter that his prostate was enlarged and that the addition of finasteride may provide benefit, but it will take at least 3 to 6 months to see benefit  - Initiate 5 alpha reductase inhibitor (finasteride 5 mg), discussed side effects   - Continue tamsulosin 0.4 mg daily:refills given  - RTC in one month for I PSS and PVR   Return in about 1 month (around 02/13/2017) for IPSS and PVR.  These notes generated with voice recognition software. I apologize for typographical errors.  Zara Council, PA-C  [facility] [address] [place], [place] [address] [phone]

## 2017-01-13 ENCOUNTER — Encounter: Payer: Self-pay | Admitting: Urology

## 2017-01-13 ENCOUNTER — Ambulatory Visit: Payer: Medicare Other | Admitting: Urology

## 2017-01-13 VITALS — BP 99/67 | HR 93 | Ht 72.0 in | Wt 169.0 lb

## 2017-01-13 DIAGNOSIS — R918 Other nonspecific abnormal finding of lung field: Secondary | ICD-10-CM

## 2017-01-13 DIAGNOSIS — N401 Enlarged prostate with lower urinary tract symptoms: Secondary | ICD-10-CM | POA: Diagnosis not present

## 2017-01-13 DIAGNOSIS — N138 Other obstructive and reflux uropathy: Secondary | ICD-10-CM

## 2017-01-13 DIAGNOSIS — R338 Other retention of urine: Secondary | ICD-10-CM

## 2017-01-13 MED ORDER — FINASTERIDE 5 MG PO TABS: 5.0000 mg | ORAL_TABLET | Freq: Every day | ORAL | 3 refills | Status: AC

## 2017-01-13 MED ORDER — TAMSULOSIN HCL 0.4 MG PO CAPS: 0.4000 mg | ORAL_CAPSULE | Freq: Every day | ORAL | 3 refills | Status: AC

## 2017-01-13 NOTE — Progress Notes (Signed)
Fill and Pull Catheter Removal  Patient is present today for a catheter removal.  Patient was cleaned and prepped in a sterile fashion 239ml of sterile water/ saline was instilled into the bladder when the patient felt the urge to urinate. 45ml of water was then drained from the balloon.  A 16FR foley cath was removed from the bladder no complications were noted .  Patient as then given some time to void on their own.  Patient can void  274ml on their own after some time.  Patient tolerated well.  Preformed by: Lyndee Hensen CMA

## 2017-01-25 DIAGNOSIS — C3490 Malignant neoplasm of unspecified part of unspecified bronchus or lung: Secondary | ICD-10-CM | POA: Diagnosis not present

## 2017-01-25 DIAGNOSIS — J9621 Acute and chronic respiratory failure with hypoxia: Secondary | ICD-10-CM

## 2017-01-25 DIAGNOSIS — A419 Sepsis, unspecified organism: Secondary | ICD-10-CM

## 2017-01-25 DIAGNOSIS — I959 Hypotension, unspecified: Secondary | ICD-10-CM | POA: Diagnosis not present

## 2017-01-25 DIAGNOSIS — I482 Chronic atrial fibrillation: Secondary | ICD-10-CM | POA: Diagnosis not present

## 2017-01-25 DIAGNOSIS — J189 Pneumonia, unspecified organism: Secondary | ICD-10-CM

## 2017-01-25 DIAGNOSIS — I2699 Other pulmonary embolism without acute cor pulmonale: Secondary | ICD-10-CM

## 2017-01-26 DIAGNOSIS — A419 Sepsis, unspecified organism: Secondary | ICD-10-CM | POA: Diagnosis not present

## 2017-01-26 DIAGNOSIS — J9621 Acute and chronic respiratory failure with hypoxia: Secondary | ICD-10-CM | POA: Diagnosis not present

## 2017-01-26 DIAGNOSIS — J189 Pneumonia, unspecified organism: Secondary | ICD-10-CM | POA: Diagnosis not present

## 2017-01-26 DIAGNOSIS — I959 Hypotension, unspecified: Secondary | ICD-10-CM | POA: Diagnosis not present

## 2017-01-27 DIAGNOSIS — I959 Hypotension, unspecified: Secondary | ICD-10-CM | POA: Diagnosis not present

## 2017-01-27 DIAGNOSIS — J189 Pneumonia, unspecified organism: Secondary | ICD-10-CM | POA: Diagnosis not present

## 2017-01-27 DIAGNOSIS — A419 Sepsis, unspecified organism: Secondary | ICD-10-CM | POA: Diagnosis not present

## 2017-01-27 DIAGNOSIS — J9621 Acute and chronic respiratory failure with hypoxia: Secondary | ICD-10-CM | POA: Diagnosis not present

## 2017-01-28 DIAGNOSIS — I959 Hypotension, unspecified: Secondary | ICD-10-CM | POA: Diagnosis not present

## 2017-01-28 DIAGNOSIS — J189 Pneumonia, unspecified organism: Secondary | ICD-10-CM | POA: Diagnosis not present

## 2017-01-28 DIAGNOSIS — A419 Sepsis, unspecified organism: Secondary | ICD-10-CM | POA: Diagnosis not present

## 2017-01-28 DIAGNOSIS — J9621 Acute and chronic respiratory failure with hypoxia: Secondary | ICD-10-CM | POA: Diagnosis not present

## 2017-01-29 DIAGNOSIS — J189 Pneumonia, unspecified organism: Secondary | ICD-10-CM | POA: Diagnosis not present

## 2017-01-29 DIAGNOSIS — I482 Chronic atrial fibrillation: Secondary | ICD-10-CM | POA: Diagnosis not present

## 2017-01-29 DIAGNOSIS — I959 Hypotension, unspecified: Secondary | ICD-10-CM | POA: Diagnosis not present

## 2017-01-29 DIAGNOSIS — J9621 Acute and chronic respiratory failure with hypoxia: Secondary | ICD-10-CM | POA: Diagnosis not present

## 2017-01-29 DIAGNOSIS — I2699 Other pulmonary embolism without acute cor pulmonale: Secondary | ICD-10-CM | POA: Diagnosis not present

## 2017-01-29 DIAGNOSIS — C3490 Malignant neoplasm of unspecified part of unspecified bronchus or lung: Secondary | ICD-10-CM | POA: Diagnosis not present

## 2017-01-29 DIAGNOSIS — A419 Sepsis, unspecified organism: Secondary | ICD-10-CM | POA: Diagnosis not present

## 2017-01-29 DIAGNOSIS — R0602 Shortness of breath: Secondary | ICD-10-CM | POA: Diagnosis not present

## 2017-01-30 DIAGNOSIS — A419 Sepsis, unspecified organism: Secondary | ICD-10-CM | POA: Diagnosis not present

## 2017-01-30 DIAGNOSIS — J189 Pneumonia, unspecified organism: Secondary | ICD-10-CM | POA: Diagnosis not present

## 2017-01-30 DIAGNOSIS — J9621 Acute and chronic respiratory failure with hypoxia: Secondary | ICD-10-CM | POA: Diagnosis not present

## 2017-01-30 DIAGNOSIS — I959 Hypotension, unspecified: Secondary | ICD-10-CM | POA: Diagnosis not present

## 2017-01-31 DIAGNOSIS — J189 Pneumonia, unspecified organism: Secondary | ICD-10-CM | POA: Diagnosis not present

## 2017-01-31 DIAGNOSIS — A419 Sepsis, unspecified organism: Secondary | ICD-10-CM | POA: Diagnosis not present

## 2017-01-31 DIAGNOSIS — J9621 Acute and chronic respiratory failure with hypoxia: Secondary | ICD-10-CM | POA: Diagnosis not present

## 2017-01-31 DIAGNOSIS — I959 Hypotension, unspecified: Secondary | ICD-10-CM | POA: Diagnosis not present

## 2017-02-01 ENCOUNTER — Inpatient Hospital Stay: Payer: Medicare Other

## 2017-02-01 ENCOUNTER — Inpatient Hospital Stay
Admission: AD | Admit: 2017-02-01 | Discharge: 2017-02-04 | DRG: 177 | Disposition: A | Discharge status: Skilled Nursing Facility | Payer: Medicare Other | Source: Other Acute Inpatient Hospital | Attending: Internal Medicine | Admitting: Internal Medicine

## 2017-02-01 DIAGNOSIS — Z792 Long term (current) use of antibiotics: Secondary | ICD-10-CM | POA: Diagnosis not present

## 2017-02-01 DIAGNOSIS — Z79899 Other long term (current) drug therapy: Secondary | ICD-10-CM | POA: Diagnosis not present

## 2017-02-01 DIAGNOSIS — Z7189 Other specified counseling: Secondary | ICD-10-CM | POA: Diagnosis not present

## 2017-02-01 DIAGNOSIS — R5382 Chronic fatigue, unspecified: Secondary | ICD-10-CM | POA: Diagnosis not present

## 2017-02-01 DIAGNOSIS — J151 Pneumonia due to Pseudomonas: Secondary | ICD-10-CM | POA: Diagnosis present

## 2017-02-01 DIAGNOSIS — J9621 Acute and chronic respiratory failure with hypoxia: Secondary | ICD-10-CM | POA: Diagnosis present

## 2017-02-01 DIAGNOSIS — Z9189 Other specified personal risk factors, not elsewhere classified: Secondary | ICD-10-CM | POA: Diagnosis not present

## 2017-02-01 DIAGNOSIS — Z87442 Personal history of urinary calculi: Secondary | ICD-10-CM

## 2017-02-01 DIAGNOSIS — Z86718 Personal history of other venous thrombosis and embolism: Secondary | ICD-10-CM

## 2017-02-01 DIAGNOSIS — I480 Paroxysmal atrial fibrillation: Secondary | ICD-10-CM | POA: Diagnosis present

## 2017-02-01 DIAGNOSIS — N400 Enlarged prostate without lower urinary tract symptoms: Secondary | ICD-10-CM | POA: Diagnosis present

## 2017-02-01 DIAGNOSIS — A419 Sepsis, unspecified organism: Secondary | ICD-10-CM | POA: Diagnosis not present

## 2017-02-01 DIAGNOSIS — J9620 Acute and chronic respiratory failure, unspecified whether with hypoxia or hypercapnia: Secondary | ICD-10-CM | POA: Diagnosis not present

## 2017-02-01 DIAGNOSIS — Z66 Do not resuscitate: Secondary | ICD-10-CM | POA: Diagnosis not present

## 2017-02-01 DIAGNOSIS — Z86711 Personal history of pulmonary embolism: Secondary | ICD-10-CM

## 2017-02-01 DIAGNOSIS — K219 Gastro-esophageal reflux disease without esophagitis: Secondary | ICD-10-CM | POA: Diagnosis present

## 2017-02-01 DIAGNOSIS — Z515 Encounter for palliative care: Secondary | ICD-10-CM | POA: Diagnosis not present

## 2017-02-01 DIAGNOSIS — F329 Major depressive disorder, single episode, unspecified: Secondary | ICD-10-CM | POA: Diagnosis present

## 2017-02-01 DIAGNOSIS — F418 Other specified anxiety disorders: Secondary | ICD-10-CM | POA: Diagnosis not present

## 2017-02-01 DIAGNOSIS — C3492 Malignant neoplasm of unspecified part of left bronchus or lung: Secondary | ICD-10-CM | POA: Diagnosis present

## 2017-02-01 DIAGNOSIS — Z7901 Long term (current) use of anticoagulants: Secondary | ICD-10-CM

## 2017-02-01 DIAGNOSIS — J690 Pneumonitis due to inhalation of food and vomit: Principal | ICD-10-CM | POA: Diagnosis present

## 2017-02-01 DIAGNOSIS — J440 Chronic obstructive pulmonary disease with acute lower respiratory infection: Secondary | ICD-10-CM | POA: Diagnosis present

## 2017-02-01 DIAGNOSIS — L89620 Pressure ulcer of left heel, unstageable: Secondary | ICD-10-CM | POA: Diagnosis present

## 2017-02-01 DIAGNOSIS — I5033 Acute on chronic diastolic (congestive) heart failure: Secondary | ICD-10-CM | POA: Diagnosis present

## 2017-02-01 DIAGNOSIS — R5381 Other malaise: Secondary | ICD-10-CM | POA: Diagnosis not present

## 2017-02-01 DIAGNOSIS — R0602 Shortness of breath: Secondary | ICD-10-CM

## 2017-02-01 DIAGNOSIS — Z87891 Personal history of nicotine dependence: Secondary | ICD-10-CM

## 2017-02-01 DIAGNOSIS — F419 Anxiety disorder, unspecified: Secondary | ICD-10-CM | POA: Diagnosis present

## 2017-02-01 DIAGNOSIS — R531 Weakness: Secondary | ICD-10-CM | POA: Diagnosis not present

## 2017-02-01 DIAGNOSIS — G4700 Insomnia, unspecified: Secondary | ICD-10-CM | POA: Diagnosis present

## 2017-02-01 DIAGNOSIS — Z806 Family history of leukemia: Secondary | ICD-10-CM

## 2017-02-01 DIAGNOSIS — R413 Other amnesia: Secondary | ICD-10-CM | POA: Diagnosis present

## 2017-02-01 DIAGNOSIS — J189 Pneumonia, unspecified organism: Secondary | ICD-10-CM | POA: Diagnosis present

## 2017-02-01 DIAGNOSIS — Z923 Personal history of irradiation: Secondary | ICD-10-CM

## 2017-02-01 DIAGNOSIS — Z8249 Family history of ischemic heart disease and other diseases of the circulatory system: Secondary | ICD-10-CM

## 2017-02-01 DIAGNOSIS — I4891 Unspecified atrial fibrillation: Secondary | ICD-10-CM | POA: Diagnosis not present

## 2017-02-01 DIAGNOSIS — J9601 Acute respiratory failure with hypoxia: Secondary | ICD-10-CM | POA: Diagnosis present

## 2017-02-01 DIAGNOSIS — C3412 Malignant neoplasm of upper lobe, left bronchus or lung: Secondary | ICD-10-CM | POA: Diagnosis not present

## 2017-02-01 DIAGNOSIS — M069 Rheumatoid arthritis, unspecified: Secondary | ICD-10-CM | POA: Diagnosis present

## 2017-02-01 DIAGNOSIS — Z9221 Personal history of antineoplastic chemotherapy: Secondary | ICD-10-CM | POA: Diagnosis not present

## 2017-02-01 DIAGNOSIS — L89153 Pressure ulcer of sacral region, stage 3: Secondary | ICD-10-CM | POA: Diagnosis present

## 2017-02-01 DIAGNOSIS — J449 Chronic obstructive pulmonary disease, unspecified: Secondary | ICD-10-CM | POA: Diagnosis not present

## 2017-02-01 DIAGNOSIS — I959 Hypotension, unspecified: Secondary | ICD-10-CM | POA: Diagnosis not present

## 2017-02-01 HISTORY — DX: Chronic obstructive pulmonary disease, unspecified: J44.9

## 2017-02-01 HISTORY — DX: Major depressive disorder, single episode, unspecified: F32.9

## 2017-02-01 HISTORY — DX: Depression, unspecified: F32.A

## 2017-02-01 LAB — COMPREHENSIVE METABOLIC PANEL
ALBUMIN: 2.4 g/dL — AB (ref 3.5–5.0)
ALK PHOS: 67 U/L (ref 38–126)
ALT: 55 U/L (ref 17–63)
ANION GAP: 3 — AB (ref 5–15)
AST: 38 U/L (ref 15–41)
BUN: 25 mg/dL — ABNORMAL HIGH (ref 6–20)
CALCIUM: 8.3 mg/dL — AB (ref 8.9–10.3)
CO2: 32 mmol/L (ref 22–32)
Chloride: 99 mmol/L — ABNORMAL LOW (ref 101–111)
Creatinine, Ser: 0.67 mg/dL (ref 0.61–1.24)
GFR calc non Af Amer: >60 mL/min (ref >60)
GLUCOSE: 183 mg/dL — AB (ref 65–99)
POTASSIUM: 3.3 mmol/L — AB (ref 3.5–5.1)
SODIUM: 134 mmol/L — AB (ref 135–145)
TOTAL PROTEIN: 6 g/dL — AB (ref 6.5–8.1)
Total Bilirubin: 0.9 mg/dL (ref 0.3–1.2)

## 2017-02-01 LAB — MRSA PCR SCREENING: MRSA by PCR: POSITIVE — AB

## 2017-02-01 MED ORDER — PNEUMOCOCCAL VAC POLYVALENT 25 MCG/0.5ML IJ INJ
0.5000 mL | INJECTION | INTRAMUSCULAR | Status: DC
  Filled 2017-02-01: qty 0.5

## 2017-02-01 MED ORDER — FUROSEMIDE 10 MG/ML IJ SOLN
20.0000 mg | Freq: Two times a day (BID) | INTRAMUSCULAR | Status: DC
  Administered 2017-02-01 – 2017-02-02 (×2): 20 mg via INTRAVENOUS
  Filled 2017-02-01 (×3): qty 2

## 2017-02-01 MED ORDER — PIPERACILLIN-TAZOBACTAM 3.375 G IVPB
3.3750 g | Freq: Three times a day (TID) | INTRAVENOUS | Status: DC
  Administered 2017-02-01 – 2017-02-03 (×6): 3.375 g via INTRAVENOUS
  Filled 2017-02-01 (×5): qty 50

## 2017-02-01 MED ORDER — DILTIAZEM HCL ER COATED BEADS 120 MG PO CP24
120.0000 mg | ORAL_CAPSULE | Freq: Every day | ORAL | Status: DC
  Administered 2017-02-04: 120 mg via ORAL
  Filled 2017-02-01 (×3): qty 1

## 2017-02-01 MED ORDER — DEXAMETHASONE 4 MG PO TABS
4.0000 mg | ORAL_TABLET | Freq: Every day | ORAL | Status: DC
  Administered 2017-02-02 – 2017-02-04 (×3): 4 mg via ORAL
  Filled 2017-02-01 (×4): qty 1

## 2017-02-01 MED ORDER — FLUOXETINE HCL 20 MG PO CAPS
40.0000 mg | ORAL_CAPSULE | Freq: Every day | ORAL | Status: DC
  Administered 2017-02-02 – 2017-02-04 (×3): 40 mg via ORAL
  Filled 2017-02-01 (×2): qty 2
  Filled 2017-02-01: qty 1
  Filled 2017-02-01: qty 2

## 2017-02-01 MED ORDER — ACETAMINOPHEN 650 MG RE SUPP: 650.0000 mg | Freq: Four times a day (QID) | RECTAL | Status: DC | PRN

## 2017-02-01 MED ORDER — FINASTERIDE 5 MG PO TABS
5.0000 mg | ORAL_TABLET | Freq: Every day | ORAL | Status: DC
  Administered 2017-02-02 – 2017-02-04 (×3): 5 mg via ORAL
  Filled 2017-02-01 (×3): qty 1

## 2017-02-01 MED ORDER — TAMSULOSIN HCL 0.4 MG PO CAPS
0.4000 mg | ORAL_CAPSULE | Freq: Every day | ORAL | Status: DC
  Administered 2017-02-01 – 2017-02-03 (×3): 0.4 mg via ORAL
  Filled 2017-02-01 (×3): qty 1

## 2017-02-01 MED ORDER — ORAL CARE MOUTH RINSE
15.0000 mL | Freq: Two times a day (BID) | OROMUCOSAL | Status: DC
  Administered 2017-02-03 – 2017-02-04 (×3): 15 mL via OROMUCOSAL

## 2017-02-01 MED ORDER — ONDANSETRON HCL 4 MG PO TABS: 4.0000 mg | ORAL_TABLET | Freq: Four times a day (QID) | ORAL | Status: DC | PRN

## 2017-02-01 MED ORDER — ADULT MULTIVITAMIN W/MINERALS CH
1.0000 | ORAL_TABLET | Freq: Every day | ORAL | Status: DC
  Administered 2017-02-02 – 2017-02-04 (×2): 1 via ORAL
  Filled 2017-02-01 (×3): qty 1

## 2017-02-01 MED ORDER — MUPIROCIN 2 % EX OINT
1.0000 "application " | TOPICAL_OINTMENT | Freq: Two times a day (BID) | CUTANEOUS | Status: DC
  Administered 2017-02-01 – 2017-02-04 (×5): 1 via NASAL
  Filled 2017-02-01: qty 22

## 2017-02-01 MED ORDER — APIXABAN 5 MG PO TABS
5.0000 mg | ORAL_TABLET | Freq: Two times a day (BID) | ORAL | Status: DC
  Administered 2017-02-01 – 2017-02-04 (×6): 5 mg via ORAL
  Filled 2017-02-01 (×6): qty 1

## 2017-02-01 MED ORDER — ONDANSETRON HCL 4 MG/2ML IJ SOLN: 4.0000 mg | Freq: Four times a day (QID) | INTRAMUSCULAR | Status: DC | PRN

## 2017-02-01 MED ORDER — VITAMIN C 500 MG PO TABS
500.0000 mg | ORAL_TABLET | Freq: Every day | ORAL | Status: DC
  Administered 2017-02-02 – 2017-02-04 (×2): 500 mg via ORAL
  Filled 2017-02-01 (×4): qty 1

## 2017-02-01 MED ORDER — FOLIC ACID 1 MG PO TABS
1.0000 mg | ORAL_TABLET | Freq: Every day | ORAL | Status: DC
  Administered 2017-02-02 – 2017-02-04 (×2): 1 mg via ORAL
  Filled 2017-02-01 (×3): qty 1

## 2017-02-01 MED ORDER — POTASSIUM CHLORIDE CRYS ER 20 MEQ PO TBCR
20.0000 meq | EXTENDED_RELEASE_TABLET | Freq: Two times a day (BID) | ORAL | Status: AC
  Administered 2017-02-01 – 2017-02-02 (×4): 20 meq via ORAL
  Filled 2017-02-01 (×4): qty 1

## 2017-02-01 MED ORDER — ZINC SULFATE 220 (50 ZN) MG PO CAPS
220.0000 mg | ORAL_CAPSULE | Freq: Every day | ORAL | Status: DC
  Administered 2017-02-02 – 2017-02-04 (×2): 220 mg via ORAL
  Filled 2017-02-01 (×4): qty 1

## 2017-02-01 MED ORDER — CHLORHEXIDINE GLUCONATE CLOTH 2 % EX PADS
6.0000 | MEDICATED_PAD | Freq: Every day | CUTANEOUS | Status: DC
  Administered 2017-02-02 – 2017-02-04 (×3): 6 via TOPICAL

## 2017-02-01 MED ORDER — OXYCODONE-ACETAMINOPHEN 5-325 MG PO TABS: 1.0000 | ORAL_TABLET | Freq: Four times a day (QID) | ORAL | Status: DC | PRN

## 2017-02-01 MED ORDER — ENOXAPARIN SODIUM 40 MG/0.4ML ~~LOC~~ SOLN: 40.0000 mg | SUBCUTANEOUS | Status: DC

## 2017-02-01 MED ORDER — PIPERACILLIN-TAZOBACTAM 3.375 G IVPB 30 MIN
3.3750 g | Freq: Once | INTRAVENOUS | Status: AC
  Administered 2017-02-01: 3.375 g via INTRAVENOUS
  Filled 2017-02-01 (×2): qty 50

## 2017-02-01 MED ORDER — ACETAMINOPHEN 325 MG PO TABS
650.0000 mg | ORAL_TABLET | Freq: Four times a day (QID) | ORAL | Status: DC | PRN
  Administered 2017-02-02: 650 mg via ORAL
  Filled 2017-02-01: qty 2

## 2017-02-01 MED ORDER — HYDROXYCHLOROQUINE SULFATE 200 MG PO TABS
200.0000 mg | ORAL_TABLET | Freq: Every day | ORAL | Status: DC
  Administered 2017-02-02 – 2017-02-04 (×3): 200 mg via ORAL
  Filled 2017-02-01 (×4): qty 1

## 2017-02-01 MED ORDER — PANTOPRAZOLE SODIUM 40 MG PO TBEC
40.0000 mg | DELAYED_RELEASE_TABLET | Freq: Every day | ORAL | Status: DC
  Administered 2017-02-02 – 2017-02-04 (×3): 40 mg via ORAL
  Filled 2017-02-01 (×3): qty 1

## 2017-02-01 MED ORDER — GABAPENTIN 100 MG PO CAPS
100.0000 mg | ORAL_CAPSULE | Freq: Three times a day (TID) | ORAL | Status: DC
  Administered 2017-02-01 – 2017-02-04 (×9): 100 mg via ORAL
  Filled 2017-02-01 (×9): qty 1

## 2017-02-01 MED ORDER — CLONAZEPAM 0.5 MG PO TABS
0.5000 mg | ORAL_TABLET | Freq: Two times a day (BID) | ORAL | Status: DC
  Administered 2017-02-01 – 2017-02-04 (×6): 0.5 mg via ORAL
  Filled 2017-02-01 (×6): qty 1

## 2017-02-01 MED ORDER — DECUBI-VITE PO CAPS: ORAL_CAPSULE | Freq: Every day | ORAL | Status: DC

## 2017-02-01 NOTE — Progress Notes (Signed)
Dr. Verdell Carmine on the floor to assess patient, md aware of bp 87/62 no new orders map is 28. Will continue to monitor

## 2017-02-01 NOTE — H&P (Signed)
[place] [facility] [place] NAME: Frank Cowan    MR#:  [id number]  DATE OF BIRTH:  May 25, 1940  DATE OF ADMISSION:  02/01/2017  PRIMARY CARE PHYSICIAN: Claiborne Billings, MD  CHIEF COMPLAINT:  No chief complaint on file. Transferred from [facility] as his doctors are located here.    HISTORY OF PRESENT ILLNESS:  Carliss Quast  is a 76 y.o. male with a known history of lung cancer, atrial fibrillation, rheumatoid arthritis, history of pulmonary embolism, COPD, depression, history of BPH who was transferred from [facility] as his doctors are located in this area and the family requested transfer. Patient was admitted to [facility] the hospital due to shortness of breath, weakness and noted to have aspiration pneumonia secondary to Pseudomonas. Patient was treated with broad-spectrum IV antibiotics initially with vancomycin, Zosyn and then switched over to just Zosyn. Patient also was noted to be volume overloaded and noted be in mild CHF and started on IV diuretics. Initially patient was on BiPAP and then weaned off of it and currently is on 5-6 L nasal cannula doing well and therefore transferred to [facility] for follow-up care as his doctors are located in this area. Patient presently denies any worsening shortness of breath, chest pain, nausea, vomiting, abdominal pain, fever, chills or any other associated symptoms. Patient's chest x-ray here shows mild volume overload and a persistent left upper lobe cavitary lesion consistent with his malignancy.  PAST MEDICAL HISTORY:   Past Medical History:  Diagnosis Date  . Atrial fibrillation ([place])   . Bacteremia   . Cancer ([place])    lung  . Cataract   . Collagen vascular disease ([place])   . COPD (chronic obstructive pulmonary disease) ([place])   . Depression   . History of kidney stones   . Kidney stones 2014  . Pulmonary emboli ([place])   . 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 CYSTOSCOPY W/ URETEROSCOPY W/ LITHOTRIPSY    . ENDOBRONCHIAL ULTRASOUND N/A 06/12/2016   Procedure: ENDOBRONCHIAL ULTRASOUND;  Surgeon: Laverle Hobby, MD;  Location: [facility] ORS;  Service: Pulmonary;  Laterality: N/A;  . PORTA CATH INSERTION N/A 06/26/2016   Procedure: Glori Luis Cath Insertion;  Surgeon: Algernon Huxley, MD;  Location: [place] [facility];  Service: Cardiovascular;  Laterality: N/A;  . PORTA CATH REMOVAL Right 12/30/2016   Procedure: Glori Luis Cath Removal;  Surgeon: Katha Cabal, MD;  Location: [place] [facility];  Service: Cardiovascular;  Laterality: Right;  . TEE WITHOUT CARDIOVERSION N/A 12/29/2016   Procedure: TRANSESOPHAGEAL ECHOCARDIOGRAM (TEE);  Surgeon: Teodoro Spray, MD;  Location: [facility] ORS;  Service: Cardiovascular;  Laterality: N/A;  . TONSILLECTOMY      SOCIAL HISTORY:   Social History  Substance Use Topics  . Smoking status: Former Smoker    Packs/day: 0.50    Years: 55.00    Quit date: 03/28/2016  . Smokeless tobacco: Former Systems developer  . Alcohol use No     Comment: Not lately    FAMILY HISTORY:   Family History  Problem Relation Age of Onset  . Heart disease Mother   . Heart disease Father   . Cancer Sister 6       luekemia  . Prostate cancer Neg Hx   . Kidney cancer Neg Hx   . Bladder Cancer Neg Hx     DRUG ALLERGIES:  No  Known Allergies  REVIEW OF SYSTEMS:   Review of Systems  Constitutional: Negative for fever and weight loss.  HENT: Negative for congestion, nosebleeds and tinnitus.   Eyes: Negative for blurred vision, double vision and redness.  Respiratory: Negative for cough, hemoptysis and shortness of breath.   Cardiovascular: Negative for chest pain, orthopnea, leg swelling and PND.  Gastrointestinal: Negative for abdominal pain, diarrhea, melena, nausea and vomiting.  Genitourinary: Negative for dysuria, hematuria and urgency.  Musculoskeletal:  Negative for falls and joint pain.  Neurological: Negative for dizziness, tingling, sensory change, focal weakness, seizures, weakness and headaches.  Endo/Heme/Allergies: Negative for polydipsia. Does not bruise/bleed easily.  Psychiatric/Behavioral: Negative for depression and memory loss. The patient is not nervous/anxious.     MEDICATIONS AT HOME:   Prior to Admission medications   Medication Sig Start Date End Date Taking? Authorizing Provider  apixaban (ELIQUIS) 5 MG TABS tablet Take 2 tabs twice a day till 11/23/16 and then take 1 tab (5 mg ) twice a day Patient taking differently: 5 mg 2 (two) times daily.  11/19/16  Yes Fritzi Mandes, MD  dexamethasone (DECADRON) 4 MG tablet Take 1 tablet by mouth daily. 09/30/16  Yes [provider]  diltiazem (CARDIZEM CD) 120 MG 24 hr capsule Take 1 capsule (120 mg total) by mouth daily. 01/01/17  Yes Max Sane, MD  finasteride (PROSCAR) 5 MG tablet Take 1 tablet (5 mg total) by mouth daily. 01/13/17  Yes McGowan, Larene Beach A, PA-C  FLUoxetine (PROZAC) 40 MG capsule Take 40 mg by mouth daily.    Yes [provider]  folic acid (FOLVITE) 1 MG tablet Take 1 mg by mouth daily.  09/13/13  Yes [provider]  gabapentin (NEURONTIN) 100 MG capsule Take 100 mg by mouth 3 (three) times daily.   Yes [provider]  hydroxychloroquine (PLAQUENIL) 200 MG tablet Take 200 mg by mouth daily.   Yes [provider]  Multiple Vitamins-Minerals (DECUBI-VITE) CAPS Take 1 tablet by mouth daily.   Yes [provider]  potassium chloride SA (K-DUR,KLOR-CON) 20 MEQ tablet Take 1 tablet by mouth 2 (two) times daily.   Yes [provider]  tamsulosin (FLOMAX) 0.4 MG CAPS capsule Take 1 capsule (0.4 mg total) by mouth at bedtime. 01/13/17  Yes McGowan, Larene Beach A, PA-C  acetaminophen (TYLENOL) 500 MG tablet Take 500 mg by mouth every 8 (eight) hours as needed for mild pain or headache.     [provider]   bisacodyl (DULCOLAX) 5 MG EC tablet Take 5 mg by mouth daily as needed for moderate constipation.    [provider]  clonazePAM (KLONOPIN) 0.5 MG tablet Take 1 tablet (0.5 mg total) by mouth 2 (two) times daily. 01/01/17   Max Sane, MD  esomeprazole (NEXIUM) 40 MG capsule Take 1 capsule (40 mg total) by mouth daily. Patient not taking: Reported on 02/01/2017 08/02/16 08/02/17  Earleen Newport, MD  lidocaine (XYLOCAINE) 2 % solution Use as directed 20 mLs in the mouth or throat as needed for mouth pain. Patient not taking: Reported on 02/01/2017 08/02/16   Earleen Newport, MD  naproxen (NAPROSYN) 250 MG tablet Take 1 tablet (250 mg total) by mouth 2 (two) times daily as needed for mild pain or moderate pain. Patient not taking: Reported on 01/13/2017 01/01/17   Max Sane, MD  oxyCODONE-acetaminophen (PERCOCET/ROXICET) 5-325 MG tablet Take 1 tablet by mouth every 6 (six) hours as needed for severe pain. Patient not taking: Reported on 02/01/2017  01/01/17   Max Sane, MD  OXYGEN 3 L by Does not apply route.     [provider]      VITAL SIGNS:  Blood pressure (!) 87/62, pulse 96, temperature 97.7 F (36.5 C), temperature source Oral, resp. rate (!) 24, height 6' (1.829 m), weight 73.4 kg (161 lb 12.8 oz), SpO2 93 %.  PHYSICAL EXAMINATION:  Physical Exam  GENERAL:  76 y.o.-year-old patient lying in the bed in mild Resp. Distress.   EYES: Pupils equal, round, reactive to light and accommodation. No scleral icterus. Extraocular muscles intact.  HEENT: Head atraumatic, normocephalic. Oropharynx and nasopharynx clear. No oropharyngeal erythema, moist oral mucosa  NECK:  Supple, no jugular venous distention. No thyroid enlargement, no tenderness.  LUNGS: Normal breath sounds bilaterally, no wheezing, bibasilar rales, rhonchi. No use of accessory muscles of respiration.  CARDIOVASCULAR: S1, S2 RRR. No murmurs, rubs, gallops, clicks.  ABDOMEN: Soft, nontender,  nondistended. Bowel sounds present. No organomegaly or mass.  EXTREMITIES: No pedal edema, cyanosis, or clubbing. + 2 pedal & radial pulses b/l.   NEUROLOGIC: Cranial nerves II through XII are intact. No focal Motor or sensory deficits appreciated b/l PSYCHIATRIC: The patient is alert and oriented x 3.  SKIN: No obvious rash, lesion, or ulcer.   LABORATORY PANEL:   CBC No results for input(s): WBC, HGB, HCT, PLT in the last 168 hours. ------------------------------------------------------------------------------------------------------------------  Chemistries   Recent Labs Lab 02/01/17 1236  NA 134*  K 3.3*  CL 99*  CO2 32  GLUCOSE 183*  BUN 25*  CREATININE 0.67  CALCIUM 8.3*  AST 38  ALT 55  ALKPHOS 67  BILITOT 0.9   ------------------------------------------------------------------------------------------------------------------  Cardiac Enzymes No results for input(s): TROPONINI in the last 168 hours. ------------------------------------------------------------------------------------------------------------------  RADIOLOGY:  Dg Chest Port 1 View  Result Date: 02/01/2017 CLINICAL DATA:  Shortness of breath.  History lung cancer. EXAM: PORTABLE CHEST 1 VIEW COMPARISON:  01/25/2017 FINDINGS: Bilateral lower lobe interstitial and alveolar airspace opacities. Cavitary mass in the left upper lobe measuring approximately 4 cm. Prominence of the central pulmonary vasculature. Trace bilateral pleural effusions. No pneumothorax. Stable cardiomegaly. No acute osseous abnormality. Generalized osteopenia. IMPRESSION: 1. Persistent left upper lobe cavitary mass consistent with malignancy. 2. Bilateral lower lobe interstitial and alveolar airspace opacities concerning for bilateral lower lobe . Electronically Signed   By: Kathreen Devoid   On: 02/01/2017 12:55     IMPRESSION AND PLAN:   76 year old male with past medical history of lung cancer, paroxysmal atrial fibrillation,  depression, COPD, recent admission for infected Port-A-Cath with MRSA bacteremia, who was transferred from [facility] to [facility] for continuity of care as his doctors are located here. Patient was [facility] to [facility] due to shortness of breath secondary to aspiration pneumonia/CHF.  1. Acute on chronic respiratory failure with hypoxia-secondary to aspiration pneumonia and mild CHF. Next-continue O2 supplementation. Given IV antibiotics with Zosyn for the aspiration pneumonia. Continue IV diuretics for the CHF. Follow clinically. -Wean off oxygen as tolerated.  2. Aspiration pneumonia-we'll treat the patient with IV Zosyn, follow cultures. Patient apparently was positive for Pseudomonas at [facility]. - seen by speech and started on Dysphagia 2 diet with nectar thick liquids.    3. CHF-acute on chronic diastolic dysfunction. -We'll diurese the patient with IV Lasix, follow I's and O's and daily weights. - cont. Cardizem.    4. Hx of RA - cont. Plaquenil.    5. BPH - cont. Flomax, Finasteride  6.  Hx of A. Fib - rate controlled.  - cont. Cardizem. Cont. Eliquis  7. GERD - cont. Protonix.   8. Depression - cont. Prozac.    9. Anxiety - cont. Klonopin.    10. Lung Cancer - pt. has squamous cell lung cancer. Patient is currently undergoing radiation treatment. Prognosis is poor. I will consult palliative care for the goals of care.  All the records are reviewed and case discussed with ED provider. Management plans discussed with the patient, family and they are in agreement.  CODE STATUS: Full code  TOTAL TIME TAKING CARE OF THIS PATIENT: 45 minutes.    Henreitta Leber M.D on 02/01/2017 at 2:43 PM  Between 7am to 6pm - Pager - [phone]  After 6pm go to [email or web address] - password [email or web address] [facility]  [place] [facility]  Office  [phone]  CC: Primary care physician; Claiborne Billings, MD

## 2017-02-01 NOTE — Progress Notes (Signed)
Pharmacy Antibiotic Note  Frank Cowan is a 76 y.o. male admitted on 02/01/2017 with pneumonia.  Pharmacy has been consulted for Zosyn dosing.  Plan: Zosyn 3.375g IV q8h (4 hour infusion).  Height: 6' (182.9 cm) Weight: 161 lb 12.8 oz (73.4 kg) IBW/kg (Calculated) : 77.6  Temp (24hrs), Avg:97.7 F (36.5 C), Min:97.7 F (36.5 C), Max:97.7 F (36.5 C)   Recent Labs Lab 02/01/17 1236  CREATININE 0.67    Estimated Creatinine Clearance: 79 mL/min (by C-G formula based on SCr of 0.67 mg/dL).    No Known Allergies  Antimicrobials this admission: Zosyn 10/21 >>     Microbiology results: 10/21 MRSA PCR: pending  Thank you for allowing pharmacy to be a part of this patient's care.  Prudy Feeler, RPh 02/01/2017 2:26 PM

## 2017-02-02 ENCOUNTER — Telehealth: Payer: Self-pay | Admitting: *Deleted

## 2017-02-02 ENCOUNTER — Encounter: Payer: Self-pay | Admitting: *Deleted

## 2017-02-02 DIAGNOSIS — Z79899 Other long term (current) drug therapy: Secondary | ICD-10-CM

## 2017-02-02 DIAGNOSIS — Z87442 Personal history of urinary calculi: Secondary | ICD-10-CM

## 2017-02-02 DIAGNOSIS — Z806 Family history of leukemia: Secondary | ICD-10-CM

## 2017-02-02 DIAGNOSIS — F418 Other specified anxiety disorders: Secondary | ICD-10-CM

## 2017-02-02 DIAGNOSIS — R5382 Chronic fatigue, unspecified: Secondary | ICD-10-CM

## 2017-02-02 DIAGNOSIS — I4891 Unspecified atrial fibrillation: Secondary | ICD-10-CM

## 2017-02-02 DIAGNOSIS — Z7901 Long term (current) use of anticoagulants: Secondary | ICD-10-CM

## 2017-02-02 DIAGNOSIS — R531 Weakness: Secondary | ICD-10-CM

## 2017-02-02 DIAGNOSIS — J449 Chronic obstructive pulmonary disease, unspecified: Secondary | ICD-10-CM

## 2017-02-02 DIAGNOSIS — R5381 Other malaise: Secondary | ICD-10-CM

## 2017-02-02 DIAGNOSIS — Z792 Long term (current) use of antibiotics: Secondary | ICD-10-CM

## 2017-02-02 DIAGNOSIS — J9620 Acute and chronic respiratory failure, unspecified whether with hypoxia or hypercapnia: Secondary | ICD-10-CM

## 2017-02-02 DIAGNOSIS — C3412 Malignant neoplasm of upper lobe, left bronchus or lung: Secondary | ICD-10-CM

## 2017-02-02 DIAGNOSIS — Z87891 Personal history of nicotine dependence: Secondary | ICD-10-CM

## 2017-02-02 DIAGNOSIS — Z86711 Personal history of pulmonary embolism: Secondary | ICD-10-CM

## 2017-02-02 LAB — BASIC METABOLIC PANEL
ANION GAP: 6 (ref 5–15)
BUN: 34 mg/dL — AB (ref 6–20)
CO2: 30 mmol/L (ref 22–32)
Calcium: 8.4 mg/dL — ABNORMAL LOW (ref 8.9–10.3)
Chloride: 101 mmol/L (ref 101–111)
Creatinine, Ser: 0.72 mg/dL (ref 0.61–1.24)
GFR calc Af Amer: >60 mL/min (ref >60)
Glucose, Bld: 81 mg/dL (ref 65–99)
POTASSIUM: 3.5 mmol/L (ref 3.5–5.1)
SODIUM: 137 mmol/L (ref 135–145)

## 2017-02-02 LAB — GLUCOSE, CAPILLARY
GLUCOSE-CAPILLARY: 150 mg/dL — AB (ref 65–99)
Glucose-Capillary: 79 mg/dL (ref 65–99)
Glucose-Capillary: 199 mg/dL — ABNORMAL HIGH (ref 65–99)

## 2017-02-02 LAB — CBC
HEMATOCRIT: 33.8 % — AB (ref 40.0–52.0)
Hemoglobin: 11.3 g/dL — ABNORMAL LOW (ref 13.0–18.0)
MCH: 31.3 pg (ref 26.0–34.0)
MCHC: 33.3 g/dL (ref 32.0–36.0)
MCV: 93.9 fL (ref 80.0–100.0)
Platelets: 148 10*3/uL — ABNORMAL LOW (ref 150–440)
RBC: 3.6 MIL/uL — ABNORMAL LOW (ref 4.40–5.90)
RDW: 16.3 % — ABNORMAL HIGH (ref 11.5–14.5)
WBC: 5.2 10*3/uL (ref 3.8–10.6)

## 2017-02-02 MED ORDER — DOCUSATE SODIUM 100 MG PO CAPS
100.0000 mg | ORAL_CAPSULE | Freq: Two times a day (BID) | ORAL | Status: DC
  Administered 2017-02-02 – 2017-02-04 (×4): 100 mg via ORAL
  Filled 2017-02-02 (×4): qty 1

## 2017-02-02 MED ORDER — BISACODYL 5 MG PO TBEC
5.0000 mg | DELAYED_RELEASE_TABLET | Freq: Every day | ORAL | Status: DC | PRN
  Administered 2017-02-02: 5 mg via ORAL
  Filled 2017-02-02: qty 1

## 2017-02-02 MED ORDER — BISACODYL 10 MG RE SUPP
10.0000 mg | Freq: Once | RECTAL | Status: AC
  Administered 2017-02-02: 10 mg via RECTAL
  Filled 2017-02-02: qty 1

## 2017-02-02 NOTE — Progress Notes (Addendum)
Speech Therapy Note: received order; reviewed chart notes and consulted NSG re: pt's status. Pt has a recent dx of aspiration pneumonia in addition to his baseline dx of lung cancer and multiple medical issues.  Due to his current pulmonary status, recommend f/u w/ objective swallow assessment for education and appropriateness to upgrade diet consistency. ST services will be able to schedule this tomorrow morning. Pt is currently on a recommended dysphagia diet and tolerating such per NSG report. NSG updated. ST services will f/u then.    Orinda Kenner, [place], CCC-SLP

## 2017-02-02 NOTE — Telephone Encounter (Signed)
Asking for a return call, patient in hospital and needs direction from Dr Grayland Ormond. Please return call.

## 2017-02-02 NOTE — Progress Notes (Signed)
[facility] [place] NAME: Frank Cowan    MR#:  [id number]  DATE OF BIRTH:  04-Dec-1940  SUBJECTIVE:   Patient very emotional and tearful today. Somewhat to have any conversation.  Nurse he ate good breakfast. No respiratory distress. REVIEW OF SYSTEMS:   Review of Systems  Unable to perform ROS: Medical condition   Tolerating Diet: Tolerating PT:   DRUG ALLERGIES:  No Known Allergies  VITALS:  Blood pressure (!) 95/59, pulse (!) 108, temperature 98.2 F (36.8 C), temperature source Oral, resp. rate 18, height 6' (1.829 m), weight 72.9 kg (160 lb 11.2 oz), SpO2 96 %.  PHYSICAL EXAMINATION:   Physical Exam  GENERAL:  76 y.o.-year-old patient lying in the bed with no acute distress. Chronically ill EYES: Pupils equal, round, reactive to light and accommodation. No scleral icterus. Extraocular muscles intact.  HEENT: Head atraumatic, normocephalic. Oropharynx and nasopharynx clear.  NECK:  Supple, no jugular venous distention. No thyroid enlargement, no tenderness.  LUNGS:coarse  breath sounds bilaterally, no wheezing, rales, rhonchi. No use of accessory muscles of respiration.  CARDIOVASCULAR: S1, S2 normal. No murmurs, rubs, or gallops.  ABDOMEN: Soft, nontender, nondistended. Bowel sounds present. No organomegaly or mass.  EXTREMITIES: No cyanosis, clubbing or edema b/l.    NEUROLOGIC: Cranial nerves II through XII are intact. No focal Motor or sensory deficits b/l.   PSYCHIATRIC:  patient is alert and oriented x 3.  SKIN: No obvious rash, lesion, or ulcer.   LABORATORY PANEL:  CBC  Recent Labs Lab 02/02/17 0544  WBC 5.2  HGB 11.3*  HCT 33.8*  PLT 148*    Chemistries   Recent Labs Lab 02/01/17 1236 02/02/17 0544  NA 134* 137  K 3.3* 3.5  CL 99* 101  CO2 32 30  GLUCOSE 183* 81  BUN 25* 34*  CREATININE 0.67 0.72  CALCIUM 8.3* 8.4*  AST 38  --   ALT 55  --   ALKPHOS 67  --   BILITOT 0.9  --     Cardiac Enzymes No results for input(s): TROPONINI in the last 168 hours. RADIOLOGY:  Dg Chest Port 1 View  Result Date: 02/01/2017 CLINICAL DATA:  Shortness of breath.  History lung cancer. EXAM: PORTABLE CHEST 1 VIEW COMPARISON:  01/25/2017 FINDINGS: Bilateral lower lobe interstitial and alveolar airspace opacities. Cavitary mass in the left upper lobe measuring approximately 4 cm. Prominence of the central pulmonary vasculature. Trace bilateral pleural effusions. No pneumothorax. Stable cardiomegaly. No acute osseous abnormality. Generalized osteopenia. IMPRESSION: 1. Persistent left upper lobe cavitary mass consistent with malignancy. 2. Bilateral lower lobe interstitial and alveolar airspace opacities concerning for bilateral lower lobe . Electronically Signed   By: Frank Cowan   On: 02/01/2017 12:55   ASSESSMENT AND PLAN:  76 year old male with past medical history of lung cancer, paroxysmal atrial fibrillation, depression, COPD, recent admission for infected Port-A-Cath with MRSA bacteremia, who was transferred from [facility] to [facility] for continuity of care as his doctors are located here. Patient was admitted to [facility] due to shortness of breath secondary to aspiration pneumonia/CHF.  1. Acute on chronic respiratory failure with hypoxia-secondary to aspiration pneumonia and mild CHF. Next-continue O2 supplementation.  -Given IV antibiotics with Zosyn for the aspiration pneumonia.  -Continue IV diuretics for the CHF. Follow clinically. -Wean off oxygen as tolerated.  2. Aspiration pneumonia -we'll treat the patient with IV Zosyn, follow cultures. Patient apparently was positive for Pseudomonas at  [facility]. - seen by speech and started on Dysphagia 2 diet with nectar thick liquids.    3. CHF-acute on chronic diastolic dysfunction. -We'll diurese the patient with IV Lasix, follow I's and O's and daily weights. - cont. Cardizem.     4. Hx of RA - cont. Plaquenil.    5. BPH - cont. Flomax, Finasteride  6.  Hx of A. Fib - rate controlled.  - cont. Cardizem. Cont. Eliquis  7. GERD - cont. Protonix.   8. Depression - cont. Prozac.    9. Anxiety - cont. Klonopin.    10. Lung Cancer - pt. has squamous cell lung cancer. Patient is currently undergoing radiation treatment. Prognosis is poor. I will consult palliative care for the goals of care. Oncology consultation placed. Discussed with Dr. Grayland Cowan  Patient overall has been declined for last few weeks per Dter Frank Cowan [phone]. She states her father is tired all being in the hospital. Will get palliative care involved.  Case discussed with Care Management/Social Worker. Management plans discussed with the patient, family and they are in agreement.  CODE STATUS: Full  DVT Prophylaxis: *Lovenox  TOTAL TIME TAKING CARE OF THIS PATIENT: *30** minutes.  >50% time spent on counselling and coordination of care  POSSIBLE D/C IN 2-3 DAYS, DEPENDING ON CLINICAL CONDITION.  Note: This dictation was prepared with Dragon dictation along with smaller phrase technology. Any transcriptional errors that result from this process are unintentional.  Frank Cowan M.D on 02/02/2017 at 3:05 PM  Between 7am to 6pm - Pager - [phone]  After 6pm go to [email or web address] - password [email or web address] [facility]  Office  [phone]  CC: Primary care physician; Frank Billings, MD

## 2017-02-02 NOTE — Consult Note (Signed)
[place] Nurse wound consult note Reason for Consult:Stage 3 pressure injury to sacrum and unstageable pressure injury to left medial heel. Present on admission.  Wound type:pressure and moisture (sacrum) Pressure Injury POA: Yes Measurement:sacrum:  2.5 cm x 0.4 cm x 0.3 cm  Left heel:  1 cm eschar intact Wound STM:HDQQ and moist Drainage (amount, consistency, odor) Minimal serosanguinous to sacrum Periwound:intact Dressing procedure/placement/frequency:Cleanse sacral wound with NS.  Apply calcium alginate (algisite) to wound bed, cover with silicone foam dressing. Change every other day.  Leave heel ulcer open to air and offload pressure while in bed.  Will not follow at this time.  Please re-consult if needed.  Domenic Moras RN BSN [place] Pager [phone]

## 2017-02-02 NOTE — Consult Note (Signed)
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  INTERVAL HISTORY: Patient is a 76 year old male who was last seen in clinic in June 2018. He has not received treatment since that time as well. Per his daughter, he has had 6 hospital admissions over the past 4 months most recently with increasing shortness of breath and declining performance status. Patient is tearful and highly anxious. He continues to have shortness of breath as well as persistent weakness and fatigue. He has no neurologic complaints. He denies any recent fevers. He has no chest pain, cough, or hemoptysis. He denies any nausea, vomiting, constipation, or diarrhea. He has no urinary complaints. Patient offers no further specific complaints today.  REVIEW OF SYSTEMS:   Review of Systems  Constitutional: Positive for malaise/fatigue. Negative for fever and weight loss.  Respiratory: Positive for shortness of breath. Negative for cough and hemoptysis.   Cardiovascular: Negative.  Negative for chest pain and leg swelling.  Gastrointestinal: Negative for abdominal pain and nausea.  Genitourinary: Negative.   Musculoskeletal: Negative.   Skin: Negative.  Negative for rash.  Neurological: Positive for weakness.  Psychiatric/Behavioral: Positive for depression and memory loss. The patient is nervous/anxious and has insomnia.     As per HPI. Otherwise, a complete review of systems is negative.  PAST MEDICAL HISTORY: Past Medical History:  Diagnosis Date  . Atrial fibrillation ([place])   . Bacteremia   . Cancer ([place])    lung  . Cataract   . Collagen vascular disease ([place])   . COPD (chronic obstructive pulmonary disease) ([place])   . Depression   . History of kidney stones   . Kidney  stones 2014  . Pulmonary emboli ([place])   . Squamous cell lung cancer, right ([place]) 05/14/2016    PAST SURGICAL HISTORY: Past Surgical History:  Procedure Laterality Date  . CYSTOSCOPY W/ URETEROSCOPY W/ LITHOTRIPSY    . ENDOBRONCHIAL ULTRASOUND N/A 06/12/2016   Procedure: ENDOBRONCHIAL ULTRASOUND;  Surgeon: Laverle Hobby, MD;  Location: [facility] ORS;  Service: Pulmonary;  Laterality: N/A;  . PORTA CATH INSERTION N/A 06/26/2016   Procedure: Glori Luis Cath Insertion;  Surgeon: Algernon Huxley, MD;  Location: [place] CV LAB;  Service: Cardiovascular;  Laterality: N/A;  . PORTA CATH REMOVAL Right 12/30/2016   Procedure: Glori Luis Cath Removal;  Surgeon: Katha Cabal, MD;  Location: [place] [facility];  Service: Cardiovascular;  Laterality: Right;  . TEE WITHOUT CARDIOVERSION N/A 12/29/2016   Procedure: TRANSESOPHAGEAL ECHOCARDIOGRAM (TEE);  Surgeon: Teodoro Spray, MD;  Location: [facility] ORS;  Service: Cardiovascular;  Laterality: N/A;  . TONSILLECTOMY      FAMILY HISTORY: Family History  Problem Relation Age of Onset  . Heart disease Mother   . Heart disease Father   . Cancer Sister 6       luekemia  . Prostate cancer Neg Hx   . Kidney cancer Neg Hx   . Bladder Cancer Neg Hx     ADVANCED DIRECTIVES (Y/N):  @ADVDIR @  HEALTH MAINTENANCE: Social History  Substance Use Topics  . Smoking status: Former Smoker    Packs/day: 0.50    Years: 55.00    Quit date: 03/28/2016  . Smokeless tobacco: Former Systems developer  . Alcohol use No     Comment: Not lately  Colonoscopy:  PAP:  Bone density:  Lipid panel:  No Known Allergies  Current Facility-Administered Medications  Medication Dose Route Frequency Provider Last Rate Last Dose  . acetaminophen (TYLENOL) tablet 650 mg  650 mg Oral Q6H PRN Henreitta Leber, MD   650 mg at 02/02/17 1715   Or  . acetaminophen (TYLENOL) suppository 650 mg  650 mg Rectal Q6H PRN Henreitta Leber, MD      . apixaban (ELIQUIS) tablet 5 mg  5 mg Oral BID  Henreitta Leber, MD   5 mg at 02/02/17 2224  . bisacodyl (DULCOLAX) EC tablet 5 mg  5 mg Oral Daily PRN Lance Coon, MD   5 mg at 02/02/17 2224  . Chlorhexidine Gluconate Cloth 2 % PADS 6 each  6 each Topical Q0600 Henreitta Leber, MD   6 each at 02/02/17 [id number]  . clonazePAM (KLONOPIN) tablet 0.5 mg  0.5 mg Oral BID Henreitta Leber, MD   0.5 mg at 02/02/17 2224  . dexamethasone (DECADRON) tablet 4 mg  4 mg Oral Daily Henreitta Leber, MD   4 mg at 02/02/17 1027  . diltiazem (CARDIZEM CD) 24 hr capsule 120 mg  120 mg Oral Daily Sainani, Vivek J, MD      . docusate sodium (COLACE) capsule 100 mg  100 mg Oral BID Lance Coon, MD   100 mg at 02/02/17 2224  . finasteride (PROSCAR) tablet 5 mg  5 mg Oral Daily Henreitta Leber, MD   5 mg at 02/02/17 1026  . FLUoxetine (PROZAC) capsule 40 mg  40 mg Oral Daily Henreitta Leber, MD   40 mg at 62/70/35 0093  . folic acid (FOLVITE) tablet 1 mg  1 mg Oral Daily Henreitta Leber, MD   1 mg at 02/02/17 1026  . furosemide (LASIX) injection 20 mg  20 mg Intravenous Q12H Henreitta Leber, MD   Stopped at 02/02/17 1800  . gabapentin (NEURONTIN) capsule 100 mg  100 mg Oral TID Henreitta Leber, MD   100 mg at 02/02/17 2224  . hydroxychloroquine (PLAQUENIL) tablet 200 mg  200 mg Oral Daily Henreitta Leber, MD   200 mg at 02/02/17 1026  . MEDLINE mouth rinse  15 mL Mouth Rinse BID Henreitta Leber, MD      . multivitamin with minerals tablet 1 tablet  1 tablet Oral Daily Henreitta Leber, MD   1 tablet at 02/02/17 1025   And  . vitamin C (ASCORBIC ACID) tablet 500 mg  500 mg Oral Daily Henreitta Leber, MD   500 mg at 02/02/17 1027   And  . zinc sulfate capsule 220 mg  220 mg Oral Daily Henreitta Leber, MD   220 mg at 02/02/17 1027  . mupirocin ointment (BACTROBAN) 2 % 1 application  1 application Nasal BID Henreitta Leber, MD   1 application at 81/82/99 2231  . ondansetron (ZOFRAN) tablet 4 mg  4 mg Oral Q6H PRN Henreitta Leber, MD       Or  .  ondansetron (ZOFRAN) injection 4 mg  4 mg Intravenous Q6H PRN Sainani, Belia Heman, MD      . oxyCODONE-acetaminophen (PERCOCET/ROXICET) 5-325 MG per tablet 1 tablet  1 tablet Oral Q6H PRN Henreitta Leber, MD      . pantoprazole (PROTONIX) EC tablet 40 mg  40 mg Oral Daily Henreitta Leber, MD   40 mg at 02/02/17 1026  . piperacillin-tazobactam (ZOSYN) IVPB  3.375 g  3.375 g Intravenous Q8H Henreitta Leber, MD 12.5 mL/hr at 02/02/17 2224 3.375 g at 02/02/17 2224  . pneumococcal 23 valent vaccine (PNU-IMMUNE) injection 0.5 mL  0.5 mL Intramuscular Tomorrow-1000 Sainani, Belia Heman, MD      . tamsulosin (FLOMAX) capsule 0.4 mg  0.4 mg Oral QHS Henreitta Leber, MD   0.4 mg at 02/02/17 2224    OBJECTIVE: Vitals:   02/02/17 1715 02/02/17 2018  BP: (!) 85/61 97/65  Pulse: 94 (!) 110  Resp:  17  Temp:  98.5 F (36.9 C)  SpO2: 96% 97%     Body mass index is 21.79 kg/m.    ECOG FS:3 - Symptomatic, >50% confined to bed  General: Well-developed, well-nourished, no acute distress. Eyes: Pink conjunctiva, anicteric sclera. HEENT: Normocephalic, moist mucous membranes, clear oropharnyx. Lungs: Clear to auscultation bilaterally. Heart: Regular rate and rhythm. No rubs, murmurs, or gallops. Abdomen: Soft, nontender, nondistended. No organomegaly noted, normoactive bowel sounds. Musculoskeletal: No edema, cyanosis, or clubbing. Neuro: Alert, answering all questions appropriately. Cranial nerves grossly intact. Skin: No rashes or petechiae noted. Psych: Normal affect. Lymphatics: No cervical, calvicular, axillary or inguinal LAD.   LAB RESULTS:  Lab Results  Component Value Date   NA 137 02/02/2017   K 3.5 02/02/2017   CL 101 02/02/2017   CO2 30 02/02/2017   GLUCOSE 81 02/02/2017   BUN 34 (H) 02/02/2017   CREATININE 0.72 02/02/2017   CALCIUM 8.4 (L) 02/02/2017   PROT 6.0 (L) 02/01/2017   ALBUMIN 2.4 (L) 02/01/2017   AST 38 02/01/2017   ALT 55 02/01/2017   ALKPHOS 67 02/01/2017   BILITOT  0.9 02/01/2017   GFRNONAA >60 02/02/2017   GFRAA >60 02/02/2017    Lab Results  Component Value Date   WBC 5.2 02/02/2017   NEUTROABS 6.3 09/25/2016   HGB 11.3 (L) 02/02/2017   HCT 33.8 (L) 02/02/2017   MCV 93.9 02/02/2017   PLT 148 (L) 02/02/2017     STUDIES: Dg Chest Port 1 View  Result Date: 02/01/2017 CLINICAL DATA:  Shortness of breath.  History lung cancer. EXAM: PORTABLE CHEST 1 VIEW COMPARISON:  01/25/2017 FINDINGS: Bilateral lower lobe interstitial and alveolar airspace opacities. Cavitary mass in the left upper lobe measuring approximately 4 cm. Prominence of the central pulmonary vasculature. Trace bilateral pleural effusions. No pneumothorax. Stable cardiomegaly. No acute osseous abnormality. Generalized osteopenia. IMPRESSION: 1. Persistent left upper lobe cavitary mass consistent with malignancy. 2. Bilateral lower lobe interstitial and alveolar airspace opacities concerning for bilateral lower lobe . 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  PLAN:    1. 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 Maintenance treatment was not initiated secondary to multiple hospitalizations and declining performance status. Patient had a PET scan on December 18, 2016 which revealed significant improvement of disease burden. No further treatments are planned at this time.  After lengthy discussion with the patient's daughter, he will likely will benefit from outpatient palliative care, but is not quite ready for hospice. Plan is to repeat CT scan the first week of December to assess for interval change. Appreciate palliative care input. 2. Acute on chronic respiratory failure: Continue antibiotics and oxygen supplementation as needed. Patient also receiving IV diuretics. 3. Aspiration pneumonia: Continue with  IV Zosyn. 4. History  of atrial fibrillation: Continue Cardizem and Eliquis as prescribed. 5. Disposition: Although patient has a declining performance status and a poor prognosis, he is not quite ready to enroll in hospice per his daughter. Outpatient palliative care may benefit the patient and prevent future hospital admissions. Please insure patient has follow-up in the [place] 1-2 weeks after discharge for further evaluation.  Appreciate consult, call with questions.  Lloyd Huger, MD   02/02/2017 10:39 PM

## 2017-02-02 NOTE — Telephone Encounter (Signed)
Dr Grayland Ormond called Admitting doctor and is being consulted. Message left on [facility] voice mail

## 2017-02-02 NOTE — Progress Notes (Signed)
Pt complaining of constipation, last BM 10/21 (yesterday) pt states he moves his bowels everyday and daughter says he takes a stool softener & dulcolax normally. Dulcolax suppository given this AM with no results. MD paged, Dr. Jannifer Franklin to order his home regimen.

## 2017-02-02 NOTE — Progress Notes (Signed)
Chart reviewed. Patient known to palliative medicine team. Visited with patient this morning. He was alert but tearful. States "everything is going wrong." Speaks of his daughter's husband being admitted to the hospital. Answers few questions and is difficult to have [place] conversation without family at bedside. Voicemail left for daughter, [name]. Discussed with Dr. Posey Pronto. Will f/u tomorrow when daughter is at bedside.  NO CHARGE  Ihor Dow, FNP-C Palliative Medicine Team  Phone: [phone] Fax: [phone]

## 2017-02-03 ENCOUNTER — Inpatient Hospital Stay: Payer: Medicare Other

## 2017-02-03 DIAGNOSIS — Z515 Encounter for palliative care: Secondary | ICD-10-CM

## 2017-02-03 DIAGNOSIS — J690 Pneumonitis due to inhalation of food and vomit: Principal | ICD-10-CM

## 2017-02-03 DIAGNOSIS — Z7189 Other specified counseling: Secondary | ICD-10-CM

## 2017-02-03 DIAGNOSIS — Z66 Do not resuscitate: Secondary | ICD-10-CM

## 2017-02-03 DIAGNOSIS — J9601 Acute respiratory failure with hypoxia: Secondary | ICD-10-CM

## 2017-02-03 DIAGNOSIS — Z9189 Other specified personal risk factors, not elsewhere classified: Secondary | ICD-10-CM

## 2017-02-03 LAB — PROCALCITONIN: Procalcitonin: <0.1 ng/mL

## 2017-02-03 MED ORDER — SODIUM CHLORIDE 0.9% FLUSH
3.0000 mL | Freq: Two times a day (BID) | INTRAVENOUS | Status: DC
  Administered 2017-02-03 – 2017-02-04 (×2): 3 mL via INTRAVENOUS

## 2017-02-03 NOTE — Consult Note (Signed)
Consultation Note Date: 02/03/2017   Patient Name: Frank Cowan  DOB: 11-Feb-1941  MRN: [id number]  Age / Sex: 76 y.o., male  PCP: Claiborne Billings, MD Referring Physician: Fritzi Mandes, MD  Reason for Consultation: Establishing goals of care  HPI/Patient Profile: 76 y.o. male  with past medical history of right squamous cell lung cancer, pulmonary emboli, kidney stones, depression, COPD, atrial fibrillation, and MRSA bacteremia admitted on 02/01/2017 from [facility] where he was admitted for shortness of breath secondary to aspiration pneumonia and mild CHF. Receiving IV antibiotics and lasix. Oxygen as needed. Followed by Dr. Grayland Ormond for stage IIIC squamous cell lung cancer s/p chemotherapy and radiation in June. PET scan on December 18, 2016 revealed significant improvement of disease burden. Recent hospital admission for MRSA bacteremia for which he completed course of antibiotics and port removal. Palliative medicine consultation for goals of care.      Clinical Assessment and Goals of Care: I have reviewed medical records, discussed with care team, and met with patient, wife ([name]), son Grayland Ormond), and daughter [name]) at bedside to discuss diagnosis, prognosis, GOC, EOL wishes, disposition and options. Patient has been seen by PMT during previous admissions.   Introduced Palliative Medicine as specialized medical care for people living with serious illness. It focuses on providing relief from the symptoms and stress of a serious illness. The goal is to improve quality of life for both the patient and the family.  We discussed a brief life review of the patient. Married to [employer] for 56 years. Two children and four grandchildren. Diagnosed with lung cancer in January. 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 He was driving himself to chemotherapy/radiation. [name] recalls his  recurrent hospitalizations secondary to infection. He has been at [facility] facility since August, which they speak very highly of. He has a great appetite. [name] states "I can't keep him full."   Discussed in detail hospital diagnoses, interventions, and underlying co-morbidities. Discussed recurrent risk for aspiration and the need to continue dysphagia diet with thickened liquids on discharge. Also risk for decline/infection/rehospitalization secondary to poor functional status. Family understands.   Advanced directives, concepts specific to code status, artifical feeding and hydration, and rehospitalization were considered and discussed. [name] and Grayland Ormond recently completed a living will with Mr. Consuegra. They are designated HCPOA's. Requested [name] bring a copy to the hospital to be scanned and placed in his chart. Discussed the MOST form with patient and family. Asked Mr. Arney his wishes regarding aggressive interventions if his heart were to stop. He states "let me go." Educated on the medical recommendation for DNR/DNI with age, cancer, and co-morbidities. Grayland Ormond agrees with DNR and explains that we would "crush his ribs." I did explain what a code blue/resuscitation would look like and poor outcomes of CPR.   Completed MOST form with patient and family. DNR/DNI. Limited interventions including re-hospitalization, use of antibiotics/IVF as needed, non-invasive forms of oxygenation, feeding tube for time trial.   Wife and children agree on  the fact that they do NOT want to see him suffer or be in pain. He complains of minimal pain (shoulder pain relieved by aleve).   Explained palliative versus hospice options on discharge. [name] is hopeful he will participate with physical therapy at the facility. She speaks of him being motivated to work with PT. Family agreeable with palliative services to follow at rehab on discharge understanding this can transition to hospice services by request.    Questions and concerns were addressed.  Hard Choices booklet left for review. Therapeutic listening as family shares many stories of Mr. Mielke. Emotional support provided.     SUMMARY OF RECOMMENDATIONS    MOST form completed. DNR/DNI, limited interventions, IVF/ABX as needed, feeding tube for time trial if indicated.   MOST form and Durable DNR placed in chart. Copies given to family.   Patient has a documented living will with [name] and Grayland Ormond as HCPOA. Copy requested.   Plan is back to SNF for rehab. Family agreeable with palliative services to follow on discharge understanding this can transition to hospice services at any time.   Code Status/Advance Care Planning:  DNR  Symptom Management:   Per attending  Palliative Prophylaxis:   Aspiration, Delirium Protocol, Frequent Pain Assessment, Oral Care and Turn Reposition  Additional Recommendations (Limitations, Scope, Preferences):  DNR. MOST form completed. Continue medical management.   Psycho-social/Spiritual:   Desire for further Chaplaincy support:no  Additional Recommendations: Caregiving  Support/Resources and Education on Hospice  Prognosis:   Unable to determine  Discharge Planning: [facility] for rehab with Palliative care service follow-up      Primary Diagnoses: Present on Admission: . Acute respiratory failure with hypoxia ([place])   I have reviewed the medical record, interviewed the patient and family, and examined the patient. The following aspects are pertinent.  Past Medical History:  Diagnosis Date  . Atrial fibrillation ([place])   . Bacteremia   . Cancer ([place])    lung  . Cataract   . Collagen vascular disease ([place])   . COPD (chronic obstructive pulmonary disease) ([place])   . Depression   . History of kidney stones   . Kidney stones 2014  . Pulmonary emboli ([place])   . Squamous cell lung cancer, right ([place]) 05/14/2016   Social History   Social History  . Marital status:  Married    Spouse name: N/A  . Number of children: N/A  . Years of education: N/A   Social History Main Topics  . Smoking status: Former Smoker    Packs/day: 0.50    Years: 55.00    Quit date: 03/28/2016  . Smokeless tobacco: Former Systems developer  . Alcohol use No     Comment: Not lately  . Drug use: No  . Sexual activity: Not Asked   Other Topics Concern  . None   Social History Narrative  . None   Family History  Problem Relation Age of Onset  . Heart disease Mother   . Heart disease Father   . Cancer Sister 6       luekemia  . Prostate cancer Neg Hx   . Kidney cancer Neg Hx   . Bladder Cancer Neg Hx    Scheduled Meds: . apixaban  5 mg Oral BID  . Chlorhexidine Gluconate Cloth  6 each Topical Q0600  . clonazePAM  0.5 mg Oral BID  . dexamethasone  4 mg Oral Daily  . diltiazem  120 mg Oral Daily  . docusate sodium  100 mg  Oral BID  . finasteride  5 mg Oral Daily  . FLUoxetine  40 mg Oral Daily  . folic acid  1 mg Oral Daily  . furosemide  20 mg Intravenous Q12H  . gabapentin  100 mg Oral TID  . hydroxychloroquine  200 mg Oral Daily  . mouth rinse  15 mL Mouth Rinse BID  . multivitamin with minerals  1 tablet Oral Daily   And  . vitamin C  500 mg Oral Daily   And  . zinc sulfate  220 mg Oral Daily  . mupirocin ointment  1 application Nasal BID  . pantoprazole  40 mg Oral Daily  . pneumococcal 23 valent vaccine  0.5 mL Intramuscular Tomorrow-1000  . tamsulosin  0.4 mg Oral QHS   Continuous Infusions: . piperacillin-tazobactam (ZOSYN)  IV Stopped (02/03/17 1029)   PRN Meds:.acetaminophen **OR** acetaminophen, bisacodyl, ondansetron **OR** ondansetron (ZOFRAN) IV, oxyCODONE-acetaminophen Medications Prior to Admission:  Prior to Admission medications   Medication Sig Start Date End Date Taking? Authorizing Provider  apixaban (ELIQUIS) 5 MG TABS tablet Take 2 tabs twice a day till 11/23/16 and then take 1 tab (5 mg ) twice a day Patient taking differently: 5 mg 2  (two) times daily.  11/19/16  Yes Fritzi Mandes, MD  dexamethasone (DECADRON) 4 MG tablet Take 1 tablet by mouth daily. 09/30/16  Yes [provider]  diltiazem (CARDIZEM CD) 120 MG 24 hr capsule Take 1 capsule (120 mg total) by mouth daily. 01/01/17  Yes Max Sane, MD  finasteride (PROSCAR) 5 MG tablet Take 1 tablet (5 mg total) by mouth daily. 01/13/17  Yes McGowan, Larene Beach A, PA-C  FLUoxetine (PROZAC) 40 MG capsule Take 40 mg by mouth daily.    Yes [provider]  folic acid (FOLVITE) 1 MG tablet Take 1 mg by mouth daily.  09/13/13  Yes [provider]  gabapentin (NEURONTIN) 100 MG capsule Take 100 mg by mouth 3 (three) times daily.   Yes [provider]  hydroxychloroquine (PLAQUENIL) 200 MG tablet Take 200 mg by mouth daily.   Yes [provider]  Multiple Vitamins-Minerals (DECUBI-VITE) CAPS Take 1 tablet by mouth daily.   Yes [provider]  potassium chloride SA (K-DUR,KLOR-CON) 20 MEQ tablet Take 1 tablet by mouth 2 (two) times daily.   Yes [provider]  tamsulosin (FLOMAX) 0.4 MG CAPS capsule Take 1 capsule (0.4 mg total) by mouth at bedtime. 01/13/17  Yes McGowan, Larene Beach A, PA-C  acetaminophen (TYLENOL) 500 MG tablet Take 500 mg by mouth every 8 (eight) hours as needed for mild pain or headache.     [provider]  bisacodyl (DULCOLAX) 5 MG EC tablet Take 5 mg by mouth daily as needed for moderate constipation.    [provider]  clonazePAM (KLONOPIN) 0.5 MG tablet Take 1 tablet (0.5 mg total) by mouth 2 (two) times daily. 01/01/17   Max Sane, MD  esomeprazole (NEXIUM) 40 MG capsule Take 1 capsule (40 mg total) by mouth daily. Patient not taking: Reported on 02/01/2017 08/02/16 08/02/17  Earleen Newport, MD  lidocaine (XYLOCAINE) 2 % solution Use as directed 20 mLs in the mouth or throat as needed for mouth pain. Patient not taking: Reported on 02/01/2017 08/02/16   Earleen Newport, MD  naproxen  (NAPROSYN) 250 MG tablet Take 1 tablet (250 mg total) by mouth 2 (two) times daily as needed for mild pain or moderate pain. Patient not taking: Reported on 01/13/2017 01/01/17  Max Sane, MD  oxyCODONE-acetaminophen (PERCOCET/ROXICET) 5-325 MG tablet Take 1 tablet by mouth every 6 (six) hours as needed for severe pain. Patient not taking: Reported on 02/01/2017 01/01/17   Max Sane, MD  OXYGEN 3 L by Does not apply route.     [provider]   No Known Allergies Review of Systems  Constitutional: Positive for activity change.  Respiratory: Positive for shortness of breath.   Neurological: Positive for weakness.   Physical Exam  Constitutional: He is oriented to person, place, and time. He is cooperative. He appears ill.  HENT:  Head: Normocephalic and atraumatic.  Cardiovascular: Regular rhythm.   Pulmonary/Chest: Effort normal.  Neurological: He is alert and oriented to person, place, and time.  Skin: Skin is warm and dry. There is pallor.  Psychiatric: Cognition and memory are normal. He is inattentive.  Nursing note and vitals reviewed.  Vital Signs: BP (!) 95/58 (BP Location: Left Arm)   Pulse 88   Temp 98.1 F (36.7 C) (Oral)   Resp 18   Ht 6' (1.829 m)   Wt 71.4 kg (157 lb 6.4 oz)   SpO2 94%   BMI 21.35 kg/m  Pain Assessment: No/denies pain   Pain Score: 0-No pain   SpO2: SpO2: 94 % O2 Device:SpO2: 94 % O2 Flow Rate: .O2 Flow Rate (L/min): 1 L/min  IO: Intake/output summary:   Intake/Output Summary (Last 24 hours) at 02/03/17 1320 Last data filed at 02/03/17 1610  Gross per 24 hour  Intake              622 ml  Output             1000 ml  Net             -378 ml    LBM: Last BM Date: 02/03/17 Baseline Weight: Weight: 73.4 kg (161 lb 12.8 oz) Most recent weight: Weight: 71.4 kg (157 lb 6.4 oz)     Palliative Assessment/Data: PPS 40%   Flowsheet Rows     Most Recent Value  Intake Tab  Referral Department  Hospitalist  Unit at Time of  Referral  Cardiac/Telemetry Unit  Palliative Care Primary Diagnosis  Sepsis/Infectious Disease  Palliative Care Type  Return patient Palliative Care  Reason for referral  Clarify Goals of Care  Date first seen by Palliative Care  02/03/17  Clinical Assessment  Palliative Performance Scale Score  40%  Psychosocial & Spiritual Assessment  Palliative Care Outcomes  Patient/Family meeting held?  Yes  Who was at the meeting?  patient, wife, two children   Palliative Care Outcomes  Clarified goals of care, Provided end of life care assistance, Provided psychosocial or spiritual support, ACP counseling assistance, Provided advance care planning, Counseled regarding hospice, Completed durable DNR, Linked to palliative care logitudinal support, Changed CPR status      Time In: 1130 Time Out: 1310 Time Total: 153mn Greater than 50%  of this time was spent counseling and coordinating care related to the above assessment and plan.  Signed by:  MIhor Dow FNP-C Palliative Medicine Team  Phone: [phone]: [phone]  Please contact Palliative Medicine Team phone at [phone] questions and concerns.  For individual provider: See AShea Evans

## 2017-02-03 NOTE — Progress Notes (Signed)
Pt's BP 90/55 HR 87. Pt due to get 20mg  IV lasix this morning, no edema. MD paged, Dr. Marcille Blanco gave orders to hold this dose. Will continue to monitor.

## 2017-02-03 NOTE — Plan of Care (Signed)
Problem: Education: Goal: Knowledge of [facility] Education information/materials will improve Outcome: Progressing Pt with no complaints of pain this shift, still receiving IV abx. BM this shift after stool softener & dulcolax given.

## 2017-02-03 NOTE — Clinical Social Work Note (Signed)
CSW received consult that patient is from [facility] in [place].  CSW attempted to call admissions worker at [facility] awaiting call back from them to find out if patient is long term care or short term rehab.  Jones Broom. [place], MSW, [place]  02/03/2017 4:32 PM

## 2017-02-03 NOTE — Progress Notes (Signed)
[facility] at [place] NAME: Frank Cowan    MR#:  [id number]  DATE OF BIRTH:  12/01/1940  SUBJECTIVE:   Patient more conversive today.  Denies any respiratory distress Some cough  Nurse he ate good breakfast. No respiratory distress. REVIEW OF SYSTEMS:   Review of Systems  Constitutional: Positive for malaise/fatigue. Negative for chills, fever and weight loss.  HENT: Negative for ear discharge, ear pain and nosebleeds.   Eyes: Negative for blurred vision, pain and discharge.  Respiratory: Positive for shortness of breath. Negative for sputum production, wheezing and stridor.   Cardiovascular: Negative for chest pain, palpitations, orthopnea and PND.  Gastrointestinal: Negative for abdominal pain, diarrhea, nausea and vomiting.  Genitourinary: Negative for frequency and urgency.  Musculoskeletal: Negative for back pain and joint pain.  Neurological: Positive for weakness. Negative for sensory change, speech change and focal weakness.  Psychiatric/Behavioral: Negative for depression and hallucinations. The patient is not nervous/anxious.    Tolerating Diet:yes Tolerating PT:   DRUG ALLERGIES:  No Known Allergies  VITALS:  Blood pressure 98/61, pulse 89, temperature 97.9 F (36.6 C), temperature source Oral, resp. rate 17, height 6' (1.829 m), weight 71.4 kg (157 lb 6.4 oz), SpO2 95 %.  PHYSICAL EXAMINATION:   Physical Exam  GENERAL:  76 y.o.-year-old patient lying in the bed with no acute distress. Chronically ill EYES: Pupils equal, round, reactive to light and accommodation. No scleral icterus. Extraocular muscles intact.  HEENT: Head atraumatic, normocephalic. Oropharynx and nasopharynx clear.  NECK:  Supple, no jugular venous distention. No thyroid enlargement, no tenderness.  LUNGS:coarse  breath sounds bilaterally, no wheezing, rales, rhonchi. No use of accessory muscles of respiration.  CARDIOVASCULAR: S1, S2 normal.  No murmurs, rubs, or gallops.  ABDOMEN: Soft, nontender, nondistended. Bowel sounds present. No organomegaly or mass.  EXTREMITIES: No cyanosis, clubbing or edema b/l.    NEUROLOGIC: Cranial nerves II through XII are intact. No focal Motor or sensory deficits b/l.   PSYCHIATRIC:  patient is alert and oriented x 3.  SKIN: No obvious rash, lesion, or ulcer.   LABORATORY PANEL:  CBC  Recent Labs Lab 02/02/17 0544  WBC 5.2  HGB 11.3*  HCT 33.8*  PLT 148*    Chemistries   Recent Labs Lab 02/01/17 1236 02/02/17 0544  NA 134* 137  K 3.3* 3.5  CL 99* 101  CO2 32 30  GLUCOSE 183* 81  BUN 25* 34*  CREATININE 0.67 0.72  CALCIUM 8.3* 8.4*  AST 38  --   ALT 55  --   ALKPHOS 67  --   BILITOT 0.9  --    Cardiac Enzymes No results for input(s): TROPONINI in the last 168 hours. RADIOLOGY:  No results found. ASSESSMENT AND PLAN:  76 year old male with past medical history of lung cancer, paroxysmal atrial fibrillation, depression, COPD, recent admission for infected Port-A-Cath with MRSA bacteremia, who was transferred from [facility] to [facility] for continuity of care as his doctors are located here. Patient was admitted to [facility] due to shortness of breath secondary to aspiration pneumonia/CHF.  1. Acute on chronic respiratory failure with hypoxia-secondary to aspiration pneumonia and mild CHF. Next-continue O2 supplementation.  -on Zosyn for the aspiration pneumonia.--check procalcitonin if negative then d/c iv abxs. Pt has been on it >7 days  -received IV diuretics for the CHF--now d/ced -Wean off oxygen as tolerated.  2. Aspiration pneumonia -we'll treat the patient with IV Zosyn, follow cultures. Patient  apparently was positive for Pseudomonas at [facility]. - seen by speech and started on Dysphagia 2 diet with nectar thick liquids.    3. CHF-acute on chronic diastolic dysfunction. -We'll diurese the patient with IV Lasix,  follow I's and O's and daily weights. - cont. Cardizem.    4. Hx of RA - cont. Plaquenil.    5. BPH - cont. Flomax, Finasteride  6.  Hx of A. Fib - rate controlled.  - cont. Cardizem. Cont. Eliquis  7. GERD - cont. Protonix.   8. Depression - cont. Prozac.    9. Anxiety - cont. Klonopin.    10. Lung Cancer - pt. has squamous cell lung cancer. Patient is currently undergoing radiation treatment. Prognosis is poor. I will consult palliative care for the goals of care. Oncology consultation placed. Discussed with Dr. Grayland Ormond  Patient overall has been declined for last few weeks per Dter [name] [phone].  Had a long conversation with patient and patient's daughter.  Patient is a DNR now.  Patient and family met with palliative care and are agreeable to outpatient palliative care which will be arranged.  If patient continues to improve will discharge back to rehab in [place] cognitive tomorrow. Case discussed with Care Management/Social Worker. Management plans discussed with the patient, family and they are in agreement.  CODE STATUS: DNR  DVT Prophylaxis: *Lovenox  TOTAL TIME TAKING CARE OF THIS PATIENT: *30** minutes.  >50% time spent on counselling and coordination of care  POSSIBLE D/C IN 2-3 DAYS, DEPENDING ON CLINICAL CONDITION.  Note: This dictation was prepared with Dragon dictation along with smaller phrase technology. Any transcriptional errors that result from this process are unintentional.  Dejanay Wamboldt M.D on 02/03/2017 at 2:36 PM  Between 7am to 6pm - Pager - [phone]  After 6pm go to [email or web address] - password [email or web address] [facility]  Office  [phone]  CC: Primary care physician; Claiborne Billings, MD

## 2017-02-03 NOTE — Evaluation (Signed)
Objective Swallowing Evaluation: MODIFIED BARIUM SWALLOW STUDY(MBSS) Patient Details  Name: Frank Cowan MRN: [id number] Date of Birth: Feb 11, 1941  Today's Date: 02/03/2017 Time: SLP Start Time (ACUTE ONLY): 0920-SLP Stop Time (ACUTE ONLY): 1020 SLP Time Calculation (min) (ACUTE ONLY): 60 min  Past Medical History:  Past Medical History:  Diagnosis Date  . Atrial fibrillation ([place])   . Bacteremia   . Cancer ([place])    lung  . Cataract   . Collagen vascular disease ([place])   . COPD (chronic obstructive pulmonary disease) ([place])   . Depression   . History of kidney stones   . Kidney stones 2014  . Pulmonary emboli ([place])   . 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 CYSTOSCOPY W/ URETEROSCOPY W/ LITHOTRIPSY    . ENDOBRONCHIAL ULTRASOUND N/A 06/12/2016   Procedure: ENDOBRONCHIAL ULTRASOUND;  Surgeon: Laverle Hobby, MD;  Location: [facility];  Service: Pulmonary;  Laterality: N/A;  . PORTA CATH INSERTION N/A 06/26/2016   Procedure: Glori Luis Cath Insertion;  Surgeon: Algernon Huxley, MD;  Location: [place] [facility];  Service: Cardiovascular;  Laterality: N/A;  . PORTA CATH REMOVAL Right 12/30/2016   Procedure: Glori Luis Cath Removal;  Surgeon: Katha Cabal, MD;  Location: [place] [facility];  Service: Cardiovascular;  Laterality: Right;  . TEE WITHOUT CARDIOVERSION N/A 12/29/2016   Procedure: TRANSESOPHAGEAL ECHOCARDIOGRAM (TEE);  Surgeon: Teodoro Spray, MD;  Location: [facility] OR[facility];  Service: Cardiovascular;  Laterality: N/A;  . TONSILLECTOMY     HPI: Pt is a 76 y.o. male with a known history of lung cancer, atrial fibrillation, rheumatoid arthritis, history of pulmonary embolism, COPD, depression, history of BPH who was transferred from [facility] as his doctors are located in this area and the family requested transfer. Patient was admitted to [facility] the hospital due to shortness of  breath, weakness and noted to have aspiration pneumonia secondary to Pseudomonas. Patient was treated with broad-spectrum IV antibiotics initially with vancomycin, Zosyn and then switched over to just Zosyn. Patient also was noted to be volume overloaded and noted be in mild CHF and started on IV diuretics. Initially patient was on BiPAP and then weaned off of it and currently is on 5-6 L nasal cannula doing well and therefore transferred to [facility] for follow-up care as his doctors are located in this area. Patient presently denies any worsening shortness of breath, chest pain, nausea, vomiting, abdominal pain, fever, chills or any other associated symptoms. Patient's chest x-ray here shows mild volume overload and a persistent left upper lobe cavitary lesion consistent with his malignancy. Pt denied any swallowing deficits prior to becoming sick.  Subjective: pt awake, sitting in MBSS chair. Verbally responsive; some confusion noted requiring verbal cues for follow through   Assessment / Plan / Recommendation  CHL IP CLINICAL IMPRESSIONS 02/03/2017  Clinical Impression no overt dysmotility was noted in the cervical esophagus though view was obscured d/t shoulders  SLP Visit Diagnosis Dysphagia, oropharyngeal phase (R13.12)  Attention and concentration deficit following --  Frontal lobe and executive function deficit following --  Impact on safety and function Mild aspiration risk;Moderate aspiration risk      CHL IP TREATMENT RECOMMENDATION 02/03/2017  Treatment Recommendations Therapy as outlined in treatment plan below     Prognosis 02/03/2017  Prognosis for Safe Diet Advancement Fair  Barriers to Reach Goals Time post onset  Barriers/Prognosis Comment --    CHL IP  DIET RECOMMENDATION 02/03/2017  SLP Diet Recommendations Dysphagia 2 (Fine chop) solids;Nectar thick liquid  Liquid Administration via Cup;Straw  Medication Administration Whole meds with puree  Compensations  Minimize environmental distractions;Slow rate;Small sips/bites;Lingual sweep for clearance of pocketing;Multiple dry swallows after each bite/sip;Follow solids with liquid  Postural Changes Remain semi-upright after after feeds/meals (Comment);Seated upright at 90 degrees      CHL IP OTHER RECOMMENDATIONS 02/03/2017  Recommended Consults (No Data)  Oral Care Recommendations Oral care BID;Staff/trained caregiver to provide oral care  Other Recommendations Order thickener from pharmacy;Prohibited food (jello, ice cream, thin soups);Remove water pitcher;Have oral suction available      CHL IP FOLLOW UP RECOMMENDATIONS 02/03/2017  Follow up Recommendations Skilled Nursing facility      [facility] IP FREQUENCY AND DURATION 02/03/2017  Speech Therapy Frequency (ACUTE ONLY) min 3x week  Treatment Duration 2 weeks           CHL IP ORAL PHASE 02/03/2017  Oral Phase Impaired  Oral - Pudding Teaspoon --  Oral - Pudding Cup --  Oral - Honey Teaspoon --  Oral - Honey Cup --  Oral - Nectar Teaspoon --  Oral - Nectar Cup --  Oral - Nectar Straw --  Oral - Thin Teaspoon --  Oral - Thin Cup --  Oral - Thin Straw --  Oral - Puree --  Oral - Mech Soft --  Oral - Regular --  Oral - Multi-Consistency --  Oral - Pill --  Oral Phase - Comment appeared to present w/ increased lingual tremors during bolus management and A-P transfer; min decreased bolus cohesion and premature spillage into the pharynx especially w/ thin liquids. Min oral and BOT residue noted which pt was able to reduce significantly w/ a f/u, dry swallow    CHL IP PHARYNGEAL PHASE 02/03/2017  Pharyngeal Phase Impaired  Pharyngeal- Pudding Teaspoon --  Pharyngeal --  Pharyngeal- Pudding Cup --  Pharyngeal --  Pharyngeal- Honey Teaspoon --  Pharyngeal --  Pharyngeal- Honey Cup --  Pharyngeal --  Pharyngeal- Nectar Teaspoon --  Pharyngeal --  Pharyngeal- Nectar Cup --  Pharyngeal --  Pharyngeal- Nectar Straw --  Pharyngeal --   Pharyngeal- Thin Teaspoon --  Pharyngeal --  Pharyngeal- Thin Cup --  Pharyngeal --  Pharyngeal- Thin Straw --  Pharyngeal --  Pharyngeal- Puree --  Pharyngeal --  Pharyngeal- Mechanical Soft --  Pharyngeal --  Pharyngeal- Regular --  Pharyngeal --  Pharyngeal- Multi-consistency --  Pharyngeal --  Pharyngeal- Pill --  Pharyngeal --  Pharyngeal Comment appeared to exhibit a delayed pharyngeal swallow initiation w/ liquid consistencies resulting in laryngeal penetration more consistently w/ thin liquids, x1 w/ Nectar liquids. No aspiration was noted during this study. Pt's decreased airway closure during the swallow appeared related to both effort and timing/coordination. Pt also exhibited moderate valleculae and BOT w/ posterior pharyngeal wall residue and min pyriform sinus residue resulting from decreased laryngeal excursion and pharyngeal pressure during the swallow. Pt was able to reduce this residue w/ f/u, dry swallow. However, any pharyngeal residue remaining increases risk for laryngeal penetration/aspiration to occur b/t swallows.       CHL IP CERVICAL ESOPHAGEAL PHASE 02/03/2017  Cervical Esophageal Phase WFL  Pudding Teaspoon --  Pudding Cup --  Honey Teaspoon --  Honey Cup --  Nectar Teaspoon --  Nectar Cup --  Nectar Straw --  Thin Teaspoon --  Thin Cup --  Thin Straw --  Puree --  Mechanical Soft --  Regular --  Multi-consistency --  Pill --  Cervical Esophageal Comment --    CHL IP GO 02/03/2017  Functional Assessment Tool Used MBSS  Functional Limitations Swallowing  Swallow Current Status (J7366) CJ  Swallow Goal Status (K1594) [place] Discharge Status (L0761) CI  Motor Speech Current Status (H1834) (None)  Motor Speech Goal Status (P7357) (None)  Motor Speech Goal Status (I9784) (None)  Spoken Language Comprehension Current Status (R8412) (None)  Spoken Language Comprehension Goal Status (K2081) (None)  Spoken Language Comprehension Discharge  Status [phone]) (None)  Spoken Language Expression Current Status [phone]) (None)  Spoken Language Expression Goal Status (X1855) (None)  Spoken Language Expression Discharge Status [phone]) (None)  Attention Current Status (W2574) (None)  Attention Goal Status (V3552) (None)  Attention Discharge Status (Z7471) (None)  Memory Current Status (T9539) (None)  Memory Goal Status (Y7289) (None)  Memory Discharge Status (T9150) (None)  Voice Current Status (C1364) (None)  Voice Goal Status (B8377) (None)  Voice Discharge Status (P3968) (None)  Other Speech-Language Pathology Functional Limitation Current Status (G6484) (None)  Other Speech-Language Pathology Functional Limitation Goal Status (F2072) (None)  Other Speech-Language Pathology Functional Limitation Discharge Status [phone]) (None)         Orinda Kenner, MS, CCC-SLP Watson,Katherine 02/03/2017, 12:41 PM

## 2017-02-04 NOTE — Clinical Social Work Note (Addendum)
Patient to be d/c'ed today to [facility].  Patient and family agreeable to plans will transport via ems RN to call report to 100 hall nurse room 108, [phone].  CSW updated patient's daughter [name], [phone].   Evette Cristal, MSW, [place]

## 2017-02-04 NOTE — Evaluation (Signed)
Physical Therapy Evaluation Patient Details Name: Frank Cowan MRN: [id number] DOB: 31-Dec-1940 Today's Date: 02/04/2017   History of Present Illness  76 y.o. male transferred here from [place] to be near family was admitted for asp PNA and SOB, weakness.  Has been on recent chemo and radiation for lung CA with note that CA had decreased with tx.  Imaging report of LUL tumor and BLL possible lesions.  PMHx:  squamous cell lung CA, FTT, PNA, a-fib, bacteremia, PE, COPD, RA, DVT, pleuritic chest pain, recent rehab admission.  Clinical Impression  Pt is up to side of bed, but is not energetic enough to attempt standing.  He had baseline O2 sat at 97% and after sitting 94-95% and tolerating poorly due to his fatigue and pain from his R hip.  Pt reports he is getting moved around aggressively at times and used bed pad to protect his joints.  Will recommend following him and did talk with him about the reason to do PT in rehab setting.  Follow acutely for strength and initiating more mobility.    Follow Up Recommendations SNF    Equipment Recommendations  None recommended by PT    Recommendations for Other Services       Precautions / Restrictions Precautions Precautions: Fall (telemetry) Precaution Comments: foley Restrictions Weight Bearing Restrictions: No      Mobility  Bed Mobility Overal bed mobility: Needs Assistance Bed Mobility: Supine to Sit;Sit to Supine     Supine to sit: Mod assist Sit to supine: Min assist;Mod assist   General bed mobility comments: needed lift to trunk to sit up and lift to legs back to bed  Transfers                 General transfer comment: pt was too weak  Ambulation/Gait                Stairs            Wheelchair Mobility    Modified Rankin (Stroke Patients Only)       Balance Overall balance assessment: Needs assistance Sitting-balance support: Feet supported;Bilateral upper extremity supported Sitting  balance-Leahy Scale: Poor                                       Pertinent Vitals/Pain Pain Assessment: Faces Faces Pain Scale: Hurts even more Pain Location: chest and R hip Pain Descriptors / Indicators: Sore;Spasm (deep hip pain) Pain Intervention(s): Limited activity within patient's tolerance;Monitored during session;Premedicated before session;Repositioned    Home Living Family/patient expects to be discharged to:: Skilled nursing facility Living Arrangements: Spouse/significant other Available Help at Discharge: Family Type of Home: House Home Access: Ramped entrance     Home Layout: One level Home Equipment: Environmental consultant - 2 wheels;Wheelchair - manual;Bedside commode      Prior Function Level of Independence: Independent with assistive device(s)               Hand Dominance   Dominant Hand: Right    Extremity/Trunk Assessment   Upper Extremity Assessment Upper Extremity Assessment: Generalized weakness    Lower Extremity Assessment Lower Extremity Assessment: Generalized weakness    Cervical / Trunk Assessment Cervical / Trunk Assessment: Kyphotic  Communication   Communication: No difficulties  Cognition Arousal/Alertness: Lethargic Behavior During Therapy: Flat affect;Anxious Overall Cognitive Status: No family/caregiver present to determine baseline cognitive functioning  General Comments: pt may be distracted with his impending plan to return to rehab      General Comments      Exercises     Assessment/Plan    PT Assessment Patient needs continued PT services  PT Problem List Decreased range of motion;Decreased strength;Decreased activity tolerance;Decreased balance;Decreased mobility;Decreased coordination;Decreased cognition;Decreased knowledge of use of DME;Decreased safety awareness;Decreased knowledge of precautions;Cardiopulmonary status limiting activity;Decreased skin  integrity;Pain       PT Treatment Interventions DME instruction;Gait training;Functional mobility training;Therapeutic activities;Therapeutic exercise;Balance training;Neuromuscular re-education;Patient/family education    PT Goals (Current goals can be found in the Care Plan section)  Acute Rehab PT Goals Patient Stated Goal: to get pain managed and stronger PT Goal Formulation: With patient Time For Goal Achievement: 02/18/17 Potential to Achieve Goals: Fair    Frequency Min 2X/week   Barriers to discharge Inaccessible home environment;Decreased caregiver support (has unlevel home entrance and wife is not able to lift him)      Co-evaluation               AM-PAC PT "6 Clicks" Daily Activity  Outcome Measure Difficulty turning over in bed (including adjusting bedclothes, sheets and blankets)?: Unable Difficulty moving from lying on back to sitting on the side of the bed? : Unable Difficulty sitting down on and standing up from a chair with arms (e.g., wheelchair, bedside commode, etc,.)?: Unable Help needed moving to and from a bed to chair (including a wheelchair)?: Total Help needed walking in hospital room?: Total Help needed climbing 3-5 steps with a railing? : Total 6 Click Score: 6    End of Session Equipment Utilized During Treatment: Oxygen Activity Tolerance: Patient limited by fatigue;Patient limited by lethargy Patient left: in bed;with call bell/phone within reach;with bed alarm set Nurse Communication: Mobility status PT Visit Diagnosis: Muscle weakness (generalized) (M62.81);Other abnormalities of gait and mobility (R26.89);Adult, failure to thrive (R62.7);Pain Pain - Right/Left: Right Pain - part of body: Hip    Time: 7078-6754 PT Time Calculation (min) (ACUTE ONLY): 25 min   Charges:   PT Evaluation $PT Eval Moderate Complexity: 1 Mod PT Treatments $Therapeutic Activity: 8-22 mins   PT G Codes:   PT G-Codes **NOT FOR INPATIENT  CLASS** Functional Assessment Tool Used: AM-PAC 6 Clicks Basic Mobility    Ramond Dial 02/04/2017, 10:46 AM   Mee Hives, PT MS Acute Rehab Dept. Number: [place] and [place]

## 2017-02-04 NOTE — Progress Notes (Signed)
Report called to Lovena Le at [facility] at [facility] [employer]. [facility] called for transport. Discharged via EMS and stretcher on 2 LNC. Foley catheter in place no signs of distress noted at time of discharge. Tele box off and returned.

## 2017-02-04 NOTE — NC FL2 (Signed)
[place] LEVEL OF CARE SCREENING TOOL     IDENTIFICATION  Patient Name: Frank Cowan Birthdate: [birth year] Sex: male Admission Date (Current Location): 02/01/2017  [place] and [place] Number:  Engineering geologist and Address:  [facility], [address], [place], [place] [address]      Provider Number: [id number]  Attending Physician Name and Address:  Fritzi Mandes, MD  Relative Name and Phone Number:  [facility] Spouse [phone] or [facility] [place] [facility] Daughter [phone] or Antonis, Lor  [phone]     Current Level of Care: Hospital Recommended Level of Care: [place] Prior Approval Number:    Date Approved/Denied:   PASRR Number: [id number]  Discharge Plan: SNF    Current Diagnoses: Patient Active Problem List   Diagnosis Date Noted  . At risk for aspiration   . Aspiration pneumonia ([place])   . DNR (do not resuscitate)   . Acute respiratory failure with hypoxia ([place]) 02/01/2017  . Palliative care encounter   . Bacteremia 12/28/2016  . Pressure injury of skin 11/17/2016  . Bilateral pulmonary embolism ([place]) 11/16/2016  . Hypotension 11/16/2016  . Anemia 11/16/2016  . Atrial fibrillation with RVR ([place]) 11/16/2016  . Fever   . Squamous cell carcinoma of lung ([place])   . Palliative care by specialist   . Failure to thrive in adult 09/10/2016  . Goals of care, counseling/discussion 06/22/2016  . 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    Orientation RESPIRATION BLADDER Height & Weight     Self, Time, Situation, Place  Normal Continent Weight: 158 lb 1.6 oz (71.7 kg) Height:  6' (182.9 cm)  BEHAVIORAL SYMPTOMS/MOOD NEUROLOGICAL BOWEL NUTRITION STATUS      Incontinent Diet (Dysphagia 2 diet)  AMBULATORY STATUS COMMUNICATION OF NEEDS Skin   Limited Assist Verbally PU Stage and Appropriate Care PU Stage 1 Dressing:  (PRN)   PU Stage 3 Dressing:  (PRN)                 Personal  Care Assistance Level of Assistance  Bathing, Feeding, Dressing Bathing Assistance: Limited assistance Feeding assistance: Independent Dressing Assistance: Limited assistance     Functional Limitations Info  Sight, Hearing, Speech Sight Info: Adequate Hearing Info: Adequate Speech Info: Adequate    SPECIAL CARE FACTORS FREQUENCY  PT (By licensed PT), Speech therapy     PT Frequency: 5x a week       Speech Therapy Frequency: Minimum 2x a week      Contractures Contractures Info: Not present    Additional Factors Info  Code Status, Allergies, Psychotropic Code Status Info: DNR Allergies Info: No Known Allergies Psychotropic Info: FLUoxetine (PROZAC) capsule 40 mg         Current Medications (02/04/2017):  This is the current hospital active medication list Current Facility-Administered Medications  Medication Dose Route Frequency Provider Last Rate Last Dose  . acetaminophen (TYLENOL) tablet 650 mg  650 mg Oral Q6H PRN Henreitta Leber, MD   650 mg at 02/02/17 1715   Or  . acetaminophen (TYLENOL) suppository 650 mg  650 mg Rectal Q6H PRN Henreitta Leber, MD      . apixaban (ELIQUIS) tablet 5 mg  5 mg Oral BID Henreitta Leber, MD   5 mg at 02/03/17 2116  . bisacodyl (DULCOLAX) EC tablet 5 mg  5 mg Oral Daily PRN Lance Coon, MD   5 mg at 02/02/17 2224  . Chlorhexidine Gluconate Cloth 2 % PADS 6 each  6 each Topical Q0600 Henreitta Leber, MD   6 each at 02/04/17 0600  . clonazePAM (KLONOPIN) tablet 0.5 mg  0.5 mg Oral BID Henreitta Leber, MD   0.5 mg at 02/03/17 2116  . dexamethasone (DECADRON) tablet 4 mg  4 mg Oral Daily Henreitta Leber, MD   4 mg at 02/03/17 1001  . diltiazem (CARDIZEM CD) 24 hr capsule 120 mg  120 mg Oral Daily Sainani, Vivek J, MD      . docusate sodium (COLACE) capsule 100 mg  100 mg Oral BID Lance Coon, MD   100 mg at 02/03/17 2116  . finasteride (PROSCAR) tablet 5 mg  5 mg Oral Daily Henreitta Leber, MD   5 mg at 02/03/17 0958  .  FLUoxetine (PROZAC) capsule 40 mg  40 mg Oral Daily Henreitta Leber, MD   40 mg at 02/03/17 1000  . folic acid (FOLVITE) tablet 1 mg  1 mg Oral Daily Henreitta Leber, MD   1 mg at 02/02/17 1026  . gabapentin (NEURONTIN) capsule 100 mg  100 mg Oral TID Henreitta Leber, MD   100 mg at 02/03/17 2116  . hydroxychloroquine (PLAQUENIL) tablet 200 mg  200 mg Oral Daily Henreitta Leber, MD   200 mg at 02/03/17 0959  . MEDLINE mouth rinse  15 mL Mouth Rinse BID Henreitta Leber, MD   15 mL at 02/03/17 2116  . multivitamin with minerals tablet 1 tablet  1 tablet Oral Daily Henreitta Leber, MD   1 tablet at 02/02/17 1025   And  . vitamin C (ASCORBIC ACID) tablet 500 mg  500 mg Oral Daily Henreitta Leber, MD   500 mg at 02/02/17 1027   And  . zinc sulfate capsule 220 mg  220 mg Oral Daily Henreitta Leber, MD   220 mg at 02/02/17 1027  . mupirocin ointment (BACTROBAN) 2 % 1 application  1 application Nasal BID Henreitta Leber, MD   1 application at 16/10/96 2116  . ondansetron (ZOFRAN) tablet 4 mg  4 mg Oral Q6H PRN Henreitta Leber, MD       Or  . ondansetron (ZOFRAN) injection 4 mg  4 mg Intravenous Q6H PRN Sainani, Belia Heman, MD      . oxyCODONE-acetaminophen (PERCOCET/ROXICET) 5-325 MG per tablet 1 tablet  1 tablet Oral Q6H PRN Henreitta Leber, MD      . pantoprazole (PROTONIX) EC tablet 40 mg  40 mg Oral Daily Henreitta Leber, MD   40 mg at 02/03/17 0958  . pneumococcal 23 valent vaccine (PNU-IMMUNE) injection 0.5 mL  0.5 mL Intramuscular Tomorrow-1000 Sainani, Vivek J, MD      . sodium chloride flush (NS) 0.9 % injection 3 mL  3 mL Intravenous Q12H Dustin Flock, MD   3 mL at 02/03/17 2200  . tamsulosin (FLOMAX) capsule 0.4 mg  0.4 mg Oral QHS Henreitta Leber, MD   0.4 mg at 02/03/17 2116     Discharge Medications: Please see discharge summary for a list of discharge medications.  Relevant Imaging Results:  Relevant Lab Results:   Additional Information SSN:   [id number]  Anell Barr

## 2017-02-04 NOTE — Discharge Summary (Signed)
[place] [facility] [place] NAME: Frank Cowan    MR#:  [id number]  DATE OF BIRTH:  1941/02/19  DATE OF ADMISSION:  02/01/2017 ADMITTING PHYSICIAN: Dustin Flock, MD  DATE OF DISCHARGE: 02/04/17  PRIMARY CARE PHYSICIAN: Claiborne Billings, MD    ADMISSION DIAGNOSIS:  Pneumonia [J18.9] DVT (deep venous thrombosis) (HCC) [I82.409]  DISCHARGE DIAGNOSIS:  Acute hypoxic respiratory failure due to Aspiration Pneumonia --completed abx treatment H/o DVT  SECONDARY DIAGNOSIS:   Past Medical History:  Diagnosis Date  . Atrial fibrillation ([place])   . Bacteremia   . Cancer ([place])    lung  . Cataract   . Collagen vascular disease ([place])   . COPD (chronic obstructive pulmonary disease) ([place])   . Depression   . History of kidney stones   . Kidney stones 2014  . Pulmonary emboli ([place])   . 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 Patient was admitted to [facility] due to shortness of breath secondary to aspiration pneumonia/CHF.  1. Acute on chronic respiratory failure with hypoxia-secondary to aspiration pneumonia and mild CHF. Next-continue O2 supplementation.  -on Zosyn for the aspiration pneumonia.completed IV abx treatment -received IV diuretics for the CHF--now d/ced -Wean off oxygen as tolerated.  2. Aspiration pneumonia -completed rx with IV Zosyn - Patient apparently was positive for Pseudomonas at [facility]. - seen by speech and started on Dysphagia 2 diet with nectar thick liquids--cont to follow ST recommedations -Barium swallow study done  3. CHF-acute on chronic diastolic  dysfunction. -recieved IV lasix--now d/ced - cont. Cardizem.   4. Hx of RA - cont. Plaquenil.   5. BPH - cont. Flomax, Finasteride  6. Hx of A. Fib - rate controlled.  - cont. Cardizem. Cont. Eliquis  7. GERD - cont. Protonix.   8. Depression - cont. Prozac.   9. Anxiety - cont. Klonopin.   10. Lung Cancer - pt. has squamous cell lung cancer. Patient is currently undergoing radiation treatment. Prognosis is poor. I will consult palliative care for the goals of care. Oncology consultation placed. Discussed with Dr. Grayland Ormond  Patient overall has been declined for last few weeks per Dter [name] [phone].  Had a long conversation with patient and patient's daughter.  Patient is a DNR now.  Patient and family met with palliative care and are agreeable to outpatient palliative care which will be arranged.  D/c to rehab today     CHL IP DIET RECOMMENDATION 02/03/2017  SLP Diet Recommendations Dysphagia 2 (Fine chop) solids;Nectar thick liquid  Liquid Administration via Cup;Straw  Medication Administration Whole meds with puree  Compensations Minimize environmental distractions;Slow rate;Small sips/bites;Lingual sweep for clearance of pocketing;Multiple dry swallows after each bite/sip;Follow solids with liquid  Postural Changes Remain semi-upright after after feeds/meals (Comment);Seated upright at 90 degrees    CONSULTS OBTAINED:  Treatment Team:  Lloyd Huger, MD Sindy Guadeloupe, MD  DRUG ALLERGIES:  No Known Allergies  DISCHARGE MEDICATIONS:   Current Discharge Medication List    CONTINUE these medications which have NOT CHANGED   Details  apixaban (ELIQUIS) 5 MG TABS tablet Take 2 tabs twice a day till 11/23/16 and then take 1 tab (5 mg )  twice a day Qty: 60 tablet, Refills: 2    dexamethasone (DECADRON) 4 MG tablet Take 1 tablet by mouth daily. Refills: 1    diltiazem (CARDIZEM CD) 120 MG 24 hr capsule Take 1 capsule (120 mg total) by mouth  daily.    finasteride (PROSCAR) 5 MG tablet Take 1 tablet (5 mg total) by mouth daily. Qty: 90 tablet, Refills: 3    FLUoxetine (PROZAC) 40 MG capsule Take 40 mg by mouth daily.     folic acid (FOLVITE) 1 MG tablet Take 1 mg by mouth daily.    Associated Diagnoses: Mass of upper lobe of left lung    gabapentin (NEURONTIN) 100 MG capsule Take 100 mg by mouth 3 (three) times daily.    hydroxychloroquine (PLAQUENIL) 200 MG tablet Take 200 mg by mouth daily.    Multiple Vitamins-Minerals (DECUBI-VITE) CAPS Take 1 tablet by mouth daily.    potassium chloride SA (K-DUR,KLOR-CON) 20 MEQ tablet Take 1 tablet by mouth 2 (two) times daily.    tamsulosin (FLOMAX) 0.4 MG CAPS capsule Take 1 capsule (0.4 mg total) by mouth at bedtime. Qty: 90 capsule, Refills: 3   Associated Diagnoses: Mass of upper lobe of left lung    acetaminophen (TYLENOL) 500 MG tablet Take 500 mg by mouth every 8 (eight) hours as needed for mild pain or headache.     bisacodyl (DULCOLAX) 5 MG EC tablet Take 5 mg by mouth daily as needed for moderate constipation.    clonazePAM (KLONOPIN) 0.5 MG tablet Take 1 tablet (0.5 mg total) by mouth 2 (two) times daily. Qty: 5 tablet, Refills: 0    esomeprazole (NEXIUM) 40 MG capsule Take 1 capsule (40 mg total) by mouth daily. Qty: 30 capsule, Refills: 1    OXYGEN 3 L by Does not apply route.       STOP taking these medications     lidocaine (XYLOCAINE) 2 % solution      naproxen (NAPROSYN) 250 MG tablet      oxyCODONE-acetaminophen (PERCOCET/ROXICET) 5-325 MG tablet         If you experience worsening of your admission symptoms, develop shortness of breath, life threatening emergency, suicidal or homicidal thoughts you must seek medical attention immediately by calling 911 or calling your MD immediately  if symptoms less severe.  You Must read complete instructions/literature along with all the possible adverse reactions/side effects for all the Medicines you take and  that have been prescribed to you. Take any new Medicines after you have completely understood and accept all the possible adverse reactions/side effects.   Please note  You were cared for by a hospitalist during your hospital stay. If you have any questions about your discharge medications or the care you received while you were in the hospital after you are discharged, you can call the unit and asked to speak with the hospitalist on call if the hospitalist that took care of you is not available. Once you are discharged, your primary care physician will handle any further medical issues. Please note that NO REFILLS for any discharge medications will be authorized once you are discharged, as it is imperative that you return to your primary care physician (or establish a relationship with a primary care physician if you do not have one) for your aftercare needs so that they can reassess your need for medications and monitor your lab values. Today   SUBJECTIVE   No new complaints  VITAL SIGNS:  Blood pressure 110/67, pulse 86, temperature 97.8  F (36.6 C), temperature source Oral, resp. rate 17, height 6' (1.829 m), weight 71.7 kg (158 lb 1.6 oz), SpO2 93 %.  I/O:   Intake/Output Summary (Last 24 hours) at 02/04/17 0917 Last data filed at 02/03/17 2200  Gross per 24 hour  Intake                0 ml  Output             1100 ml  Net            -1100 ml    PHYSICAL EXAMINATION:  GENERAL:  76 y.o.-year-old patient lying in the bed with no acute distress.  EYES: Pupils equal, round, reactive to light and accommodation. No scleral icterus. Extraocular muscles intact.  HEENT: Head atraumatic, normocephalic. Oropharynx and nasopharynx clear.  NECK:  Supple, no jugular venous distention. No thyroid enlargement, no tenderness.  LUNGS: Normal breath sounds bilaterally, no wheezing, rales,rhonchi or crepitation. No use of accessory muscles of respiration.  CARDIOVASCULAR: S1, S2 normal. No murmurs,  rubs, or gallops.  ABDOMEN: Soft, non-tender, non-distended. Bowel sounds present. No organomegaly or mass.  EXTREMITIES: Nchronic LE edema+ NEUROLOGIC: Cranial nerves II through XII are intact. Muscle strength 4/5 in all extremities. Sensation intact. Gait not checked.  PSYCHIATRIC: The patient is alert and oriented x 3. Stage 3 decubitus + SKIN: No obvious rash, lesion, or ulcer.   DATA REVIEW:   CBC   Recent Labs Lab 02/02/17 0544  WBC 5.2  HGB 11.3*  HCT 33.8*  PLT 148*    Chemistries   Recent Labs Lab 02/01/17 1236 02/02/17 0544  NA 134* 137  K 3.3* 3.5  CL 99* 101  CO2 32 30  GLUCOSE 183* 81  BUN 25* 34*  CREATININE 0.67 0.72  CALCIUM 8.3* 8.4*  AST 38  --   ALT 55  --   ALKPHOS 67  --   BILITOT 0.9  --     Microbiology Results   Recent Results (from the past 240 hour(s))  MRSA PCR Screening     Status: Abnormal   Collection Time: 02/01/17 12:49 PM  Result Value Ref Range Status   MRSA by PCR POSITIVE (A) NEGATIVE Final    Comment:        The GeneXpert MRSA Assay (FDA approved for NASAL specimens only), is one component of a comprehensive MRSA colonization surveillance program. It is not intended to diagnose MRSA infection nor to guide or monitor treatment for MRSA infections. RESULT CALLED TO, READ BACK BY AND VERIFIED WITH: CRYSTAL GROGEN AT 2637 ON 02/01/2017 JJB     RADIOLOGY:  No results found.   Management plans discussed with the patient, family and they are in agreement.  CODE STATUS:     Code Status Orders        Start     Ordered   02/03/17 1254  Do not attempt resuscitation (DNR)  Continuous    Question Answer Comment  In the event of cardiac or respiratory ARREST Do not call a "code blue"   In the event of cardiac or respiratory ARREST Do not perform Intubation, CPR, defibrillation or ACLS   In the event of cardiac or respiratory ARREST Use medication by any route, position, wound care, and other measures to relive pain  and suffering. May use oxygen, suction and manual treatment of airway obstruction as needed for comfort.      02/03/17 1253    Code Status History    Date Active  Date Inactive Code Status Order ID Comments User Context   02/01/2017 12:18 PM 02/03/2017 12:53 PM Full Code [id number]  Henreitta Leber, MD Inpatient   12/28/2016  4:07 PM 01/01/2017  3:06 PM Full Code [id number]  Vaughan Basta, MD Inpatient   11/16/2016 10:22 PM 11/19/2016  8:42 PM Full Code [id number]  Theodoro Grist, MD Inpatient   09/10/2016 12:37 PM 09/13/2016  3:11 PM Full Code [id number]  Max Sane, MD Inpatient    Advance Directive Documentation     Most Recent Value  Type of Advance Directive  Living will, Healthcare Power of Attorney  Pre-existing out of facility DNR order (yellow form or pink MOST form)  -  "MOST" Form in Place?  -      TOTAL TIME TAKING CARE OF THIS PATIENT: 40 minutes.    Dulcinea Kinser M.D on 02/04/2017 at 9:17 AM  Between 7am to 6pm - Pager - [phone] After 6pm go to [email or web address] - password [email or web address] [place] [facility]  Office  [phone]  CC: Primary care physician; Claiborne Billings, MD

## 2017-02-04 NOTE — Clinical Social Work Note (Signed)
CSW spoke with [facility] who said patient is short term rehab and they can accept patient back today.  Jones Broom. [place], MSW, [place]  02/04/2017 10:14 AM

## 2017-02-04 NOTE — Care Management Important Message (Signed)
Important Message  Patient Details  Name: Frank Cowan MRN: [id number] Date of Birth: June 18, 1940   Medicare Important Message Given:  Yes Signed IM notice given    Katrina Stack, RN 02/04/2017, 9:52 AM

## 2017-02-04 NOTE — Clinical Social Work Note (Addendum)
Clinical Social Work Assessment  Patient Details  Name: Frank Cowan MRN: [id number] Date of Birth: 18-Dec-1940  Date of referral:  02/04/17               Reason for consult:  Facility Placement                Permission sought to share information with:  Facility Sport and exercise psychologist, Family Supports Permission granted to share information::  Yes, Verbal Permission Granted  Name::     Frank Cowan Spouse [phone] or Frank Cowan Daughter [phone]   Agency::  SNF admissions  Relationship::     Contact Information:     Housing/Transportation Living arrangements for the past 2 months:  [place] of Information:  Adult Children Patient Interpreter Needed:  None Criminal Activity/Legal Involvement Pertinent to Current Situation/Hospitalization:  No - Comment as needed Significant Relationships:  Adult Children, Spouse Lives with:  Facility Resident Do you feel safe going back to the place where you live?  Yes Need for family participation in patient care:  Yes (Comment)  Care giving concerns:  Patient's family are pleased with care being provided at [facility].   Social Worker assessment / plan:  Patient is a 76 year old male who is alert and oriented x4.  Patient was sleeping CSW completed assessment with patient's daughter Frank Cowan [phone].  Patient was living at home with his wife until earlier in the summer, patient had to go to hospital, and has been at [facility] since August 1st, 2018.  Patient's daughter stated that patient has been in and out of the hospital several times.  Patient's daughter states they have been pleased with the care being provided at [facility], and do not have any concerns.  Patient has been at [facility] for awhile, family is familiar with the facility and process.  CSW spoke to patient's daughter and asked her if they have started the [facility] application process for long term care, and she said they have started it.   CSW explained to patient's daughter role of CSW and procedure of having patient return back to SNF.  Patient's daughter gave CSW permission to send required information back to SNF.  Patient's daughter did not have any other questions or concerns.    Employment status:  Retired Nurse, adult PT Recommendations:  [place] / Referral to community resources:  [place]  Patient/Family's Response to care:  Patient's family is agreeable to have patient return back to SNF and to have palliative follow. Patient/Family's Understanding of and Emotional Response to Diagnosis, Current Treatment, and Prognosis:  Patient's family is aware that he has a poor prognosis, however they would like to pursue rehab again, and transition him over to long term care.  Emotional Assessment Appearance:  Appears stated age Attitude/Demeanor/Rapport:    Affect (typically observed):  Appropriate, Sad, Calm, Stable Orientation:  Oriented to Self, Oriented to Place, Oriented to  Time, Oriented to Situation Alcohol / Substance use:  Not Applicable Psych involvement (Current and /or in the community):  No (Comment)  Discharge Needs  Concerns to be addressed:  Care Coordination Readmission within the last 30 days:  No Current discharge risk:  None Barriers to Discharge:  No Barriers Identified   Ross Ludwig, LCSWA 02/04/2017, 11:49 AM

## 2017-02-18 NOTE — Progress Notes (Signed)
02/19/2017 11:14 PM   Frank Cowan June 02, 1940 [id number]  Referring provider: Claiborne Billings, MD No address on file  Chief Complaint  Patient presents with  . Follow-up    HPI: Patient is a 76 year old Caucasian male with BPH with LU TS and history of urinary retention who presents today for a one month follow up.      History of urinary retention Hx:  Frank Cowan is a 76 yo WM who was admitted for an infected PICC line.   He had been in rehab following an aspiration pneumonia.   He has a history of urinary retention that has been managed with a foley for the past month who was seen in  Consultation.  The patient is on tamsulosin but it is not clear whether that has just been since the urinary retention or prior.  He had some voiding complaints prior to developing retention.   He is tolerating the foley well.  His GU history is significant for a right PCNL about 2 years ago in [place] and he has passed other stones.  He has had no prostate procedures.  He was complaining of frequency, urgency, nocturia and straining to urinate. These are prior to having the Foley catheter placed. He did fail a voiding trial during his hospital stay.   He has been on the tamsulosin for 2 years.  He has not had any recent fevers, chills, nausea or vomiting.   He has not had recent hematuria.   He was started on finasteride at his last visit.  Patient presents today only with the paramedics.  He is a poor historian and no family members are present.  Attempted to speak with daughter by phone, but there was no answer.    When patient was seen by me on 01/20/17, he had passed a fill and pull and was instructed to follow up in one month.    Today, he has a catheter in place.  His prognosis is poor due to his squamous cell lung cancer and has been placed under palliative care.    He is currently residing at [facility] and they do not have the capabilities to scan the bladder or place a Foley  catheter if patient should go into retention.    Patient keeps stating that he wants to go home and eat lunch.  He just wants to "get out of here."    PMH: Past Medical History:  Diagnosis Date  . Atrial fibrillation ([place])   . Bacteremia   . Cancer ([place])    lung  . Cataract   . Collagen vascular disease ([place])   . COPD (chronic obstructive pulmonary disease) ([place])   . Depression   . History of kidney stones   . Kidney stones 2014  . Pulmonary emboli ([place])   . 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 CYSTOSCOPY W/ URETEROSCOPY W/ LITHOTRIPSY    . ENDOBRONCHIAL ULTRASOUND N/A 06/12/2016   Procedure: ENDOBRONCHIAL ULTRASOUND;  Surgeon: Laverle Hobby, MD;  Location: [facility] ORS;  Service: Pulmonary;  Laterality: N/A;  . PORTA CATH INSERTION N/A 06/26/2016   Procedure: Glori Luis Cath Insertion;  Surgeon: Algernon Huxley, MD;  Location: [facility] CV LAB;  Service: Cardiovascular;  Laterality: N/A;  . PORTA CATH REMOVAL Right 12/30/2016   Procedure: Glori Luis Cath Removal;  Surgeon: Katha Cabal, MD;  Location: [place] LAB;  Service: Cardiovascular;  Laterality: Right;  . TEE WITHOUT CARDIOVERSION N/A 12/29/2016   Procedure: TRANSESOPHAGEAL ECHOCARDIOGRAM (TEE);  Surgeon: Teodoro Spray, MD;  Location: [facility] ORS;  Service: Cardiovascular;  Laterality: N/A;  . TONSILLECTOMY      Home Medications:  Allergies as of 02/19/2017   No Known Allergies     Medication List        Accurate as of 02/19/17 11:59 PM. Always use your most recent med list.          acetaminophen 500 MG tablet Commonly known as:  TYLENOL Take 500 mg by mouth every 8 (eight) hours as needed for mild pain or headache.   apixaban 5 MG Tabs tablet Commonly known as:  ELIQUIS Take 2 tabs twice a day till 11/23/16 and then take 1 tab (5 mg ) twice a day   bisacodyl 5 MG EC tablet Commonly known as:  DULCOLAX Take 5 mg by  mouth daily as needed for moderate constipation.   clonazePAM 0.5 MG tablet Commonly known as:  KLONOPIN Take 1 tablet (0.5 mg total) by mouth 2 (two) times daily.   DECUBI-VITE Caps Take 1 tablet by mouth daily.   dexamethasone 4 MG tablet Commonly known as:  DECADRON Take 1 tablet by mouth daily.   diltiazem 120 MG 24 hr capsule Commonly known as:  CARDIZEM CD Take 1 capsule (120 mg total) by mouth daily.   esomeprazole 40 MG capsule Commonly known as:  NEXIUM Take 1 capsule (40 mg total) by mouth daily.   finasteride 5 MG tablet Commonly known as:  PROSCAR Take 1 tablet (5 mg total) by mouth daily.   FLUoxetine 40 MG capsule Commonly known as:  PROZAC Take 40 mg by mouth daily.   folic acid 1 MG tablet Commonly known as:  FOLVITE Take 1 mg by mouth daily.   gabapentin 100 MG capsule Commonly known as:  NEURONTIN Take 100 mg by mouth 3 (three) times daily.   hydroxychloroquine 200 MG tablet Commonly known as:  PLAQUENIL Take 200 mg by mouth daily.   OXYGEN 3 L by Does not apply route.   potassium chloride SA 20 MEQ tablet Commonly known as:  K-DUR,KLOR-CON Take 1 tablet by mouth 2 (two) times daily.   tamsulosin 0.4 MG Caps capsule Commonly known as:  FLOMAX Take 1 capsule (0.4 mg total) by mouth at bedtime.       Allergies: No Known Allergies  Family History: Family History  Problem Relation Age of Onset  . Heart disease Mother   . Heart disease Father   . Cancer Sister 6       luekemia  . Prostate cancer Neg Hx   . Kidney cancer Neg Hx   . Bladder Cancer Neg Hx     Social History:  reports that he quit smoking about 11 months ago. He has a 27.50 pack-year smoking history. He has quit using smokeless tobacco. He reports that he does not drink alcohol or use drugs.  ROS: UROLOGY Frequent Urination?: No Hard to postpone urination?: No Burning/pain with urination?: No Get up at night to urinate?: No Leakage of urine?: No Urine stream  starts and stops?: No Trouble starting stream?: No Do you have to strain to urinate?: No Blood in urine?: No Urinary tract infection?: No Sexually transmitted disease?: No Injury to kidneys or bladder?: No Painful intercourse?: No Weak stream?: No Erection problems?: No Penile pain?: No  Gastrointestinal Nausea?: No Vomiting?: No Indigestion/heartburn?: No Diarrhea?: No Constipation?: No  Constitutional Fever:  No Night sweats?: No Weight loss?: No Fatigue?: No  Skin Skin rash/lesions?: No Itching?: No  Eyes Blurred vision?: No Double vision?: No  Ears/Nose/Throat Sore throat?: No Sinus problems?: No  Hematologic/Lymphatic Swollen glands?: No Easy bruising?: No  Cardiovascular Leg swelling?: No Chest pain?: No  Respiratory Cough?: No Shortness of breath?: No  Endocrine Excessive thirst?: No  Musculoskeletal Back pain?: No Joint pain?: No  Neurological Headaches?: No Dizziness?: No  Psychologic Depression?: No Anxiety?: No  Physical Exam: Constitutional: Well nourished. Alert.  No acute distress. HEENT: [place] AT, moist mucus membranes. Trachea midline, no masses. Cardiovascular: No clubbing, cyanosis, or edema. Respiratory: Normal respiratory effort, no increased work of breathing. Skin: No rashes, bruises or suspicious lesions. Lymph: No cervical or inguinal adenopathy. Neurologic: Grossly intact, no focal deficits, moving all 4 extremities. Psychiatric: Agitated.    Laboratory Data: Lab Results  Component Value Date   WBC 5.2 02/02/2017   HGB 11.3 (L) 02/02/2017   HCT 33.8 (L) 02/02/2017   MCV 93.9 02/02/2017   PLT 148 (L) 02/02/2017    Lab Results  Component Value Date   CREATININE 0.72 02/02/2017    Lab Results  Component Value Date   TSH 0.153 (L) 11/16/2016    Lab Results  Component Value Date   AST 38 02/01/2017   Lab Results  Component Value Date   ALT 55 02/01/2017    I have reviewed the  labs.    Assessment & Plan:    1. History of urinary retention  - patient will continue with indwelling Foley at this time  - it should be exchanged monthly - currently patient receiving rehab at [facility]   - may need to arrange home health care as current facility is unable to perform this procedure     Return in about 1 month (around 03/21/2017) for Foley exchange if cannot find a serivce to perform this task .  These notes generated with voice recognition software. I apologize for typographical errors.  Zara Council, [facility] [address] [place], [place] [address] [phone]

## 2017-02-19 ENCOUNTER — Encounter: Payer: Self-pay | Admitting: Urology

## 2017-02-19 ENCOUNTER — Ambulatory Visit (INDEPENDENT_AMBULATORY_CARE_PROVIDER_SITE_OTHER): Payer: Medicare Other | Admitting: Urology

## 2017-02-19 DIAGNOSIS — Z87898 Personal history of other specified conditions: Secondary | ICD-10-CM | POA: Diagnosis not present

## 2017-03-03 ENCOUNTER — Telehealth: Payer: Self-pay | Admitting: Urology

## 2017-03-03 NOTE — Telephone Encounter (Signed)
Would you contact Frank Cowan daughter [name] [phone] and ask her what her thoughts are regarding continuing the indwelling Foley as he has failed several TOV's?  I understand his prognosis is poor and he is currently under palliative care.  He will need to have the Foley exchanged monthly and from what I understand [facility] cannot exchange the Foley and it is a short term facility.  After he is released from [facility], what facility is he transferring to and are they capable of exchanging the Foley?    If Mr. Hochstetler does not have access to someone that can exchange his Foley, we may need to set up [facility] care.

## 2017-03-04 NOTE — Telephone Encounter (Signed)
LMOM for Frank Cowan.

## 2017-03-09 ENCOUNTER — Ambulatory Visit: Payer: Medicare Other | Admitting: Radiation Oncology

## 2017-03-09 NOTE — Telephone Encounter (Signed)
LMOM for Frank Cowan

## 2017-03-10 ENCOUNTER — Telehealth: Payer: Self-pay | Admitting: *Deleted

## 2017-03-11 ENCOUNTER — Ambulatory Visit: Payer: Medicare Other | Admitting: Radiation Oncology

## 2017-03-14 NOTE — Telephone Encounter (Signed)
[facility] called to report that patient expired this morning at 1:32 AM

## 2017-03-14 DEATH — deceased

## 2017-03-18 ENCOUNTER — Other Ambulatory Visit: Payer: Self-pay | Admitting: Nurse Practitioner

## 2017-03-21 ENCOUNTER — Other Ambulatory Visit: Payer: Self-pay | Admitting: Nurse Practitioner

## 2017-04-14 DEATH — deceased

## 2017-04-27 DIAGNOSIS — C3412 Malignant neoplasm of upper lobe, left bronchus or lung: Secondary | ICD-10-CM

## 2018-07-09 IMAGING — PT NM PET TUM IMG INITIAL (PI) SKULL BASE T - THIGH
1 of 11 series · 1 of 25 positions shown · non-contrast
Comparison: Chest CT 05/08/2016

CLINICAL DATA: Initial treatment strategy for left upper lobe mass.

EXAM:
NUCLEAR MEDICINE PET SKULL BASE TO THIGH
TECHNIQUE: 12.7 mCi F-18 FDG was injected intravenously. Full-ring PET imaging
was performed from the skull base to thigh after the radiotracer. CT
data was obtained and used for attenuation correction and anatomic
localization.
FASTING BLOOD GLUCOSE:  Value: 97 mg/dl

[Series 3: ct wb 5.0 b30f · axial · 5.0mm · 0.98mm/px · 1 of 329 slices shown]
[im 329/329  brain]
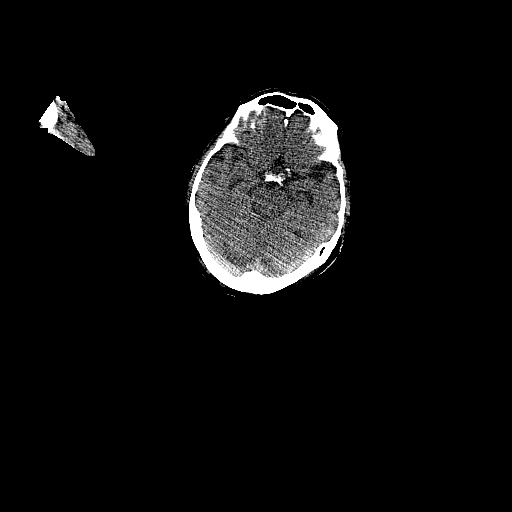

[1 of 25 positions shown; findings below may reference images not displayed]

FINDINGS: NECK

No hypermetabolic lymph nodes in the neck. Mild glottic activity is
likely physiologic.

CHEST

Dominant centrally necrotic 6.7 by 6.3 cm left upper lobe mass on
image 90/3, maximum SUV 21.3.

Pathologic been hypermetabolic right paratracheal, AP window, and
right hilar adenopathy. Right lower paratracheal node 1.4 cm in
short axis on image 86/3, maximum SUV 14.9. Right hilar node 2.0 cm
in short axis on image 94/3, maximum SUV 14.5.

The left upper lobe nodule with the thick calcification posteriorly
is not hypermetabolic, and measures about 2.5 by 1.8 cm on image
74/3.

Bilateral airway thickening. Coronary, aortic arch, and branch
vessel atherosclerotic vascular disease.

ABDOMEN/PELVIS

Photopenic left-sided renal cysts. Sigmoid diverticulosis.
Aortoiliac atherosclerotic vascular disease. Levoconvex rotary
lumbar scoliosis. No findings of metastatic disease to the abdomen
or pelvis. Mildly prominent prostate gland.

SKELETON

No focal hypermetabolic activity to suggest skeletal metastasis.
IMPRESSION: 1. Highly hypermetabolic dominant 6.7 cm left upper lobe mass with
hypermetabolic right hilar, right paratracheal, and AP window
adenopathy. Assuming non-small cell lung cancer, appearance is
compatible with T3b N3 M0 disease (stage IIIb).
2. The smaller left upper lobe nodule with posterior calcification
seems benign, and is not hypermetabolic and could be a pulmonary
hamartoma.
3. Other imaging findings of potential clinical significance:
Coronary, aortic arch, and branch vessel atherosclerotic vascular
disease. Aortoiliac atherosclerotic vascular disease. Airway
thickening is present, suggesting bronchitis or reactive airways
disease. Thoracolumbar scoliosis. Mildly prominent prostate gland.

## 2018-08-01 IMAGING — DX DG CHEST 1V
1 series · 1 of 1 positions shown · non-contrast
Comparison: PET-CT 05/20/2016 .

CLINICAL DATA: Bronchoscopy.

EXAM:
CHEST 1 VIEW

[chest ap]
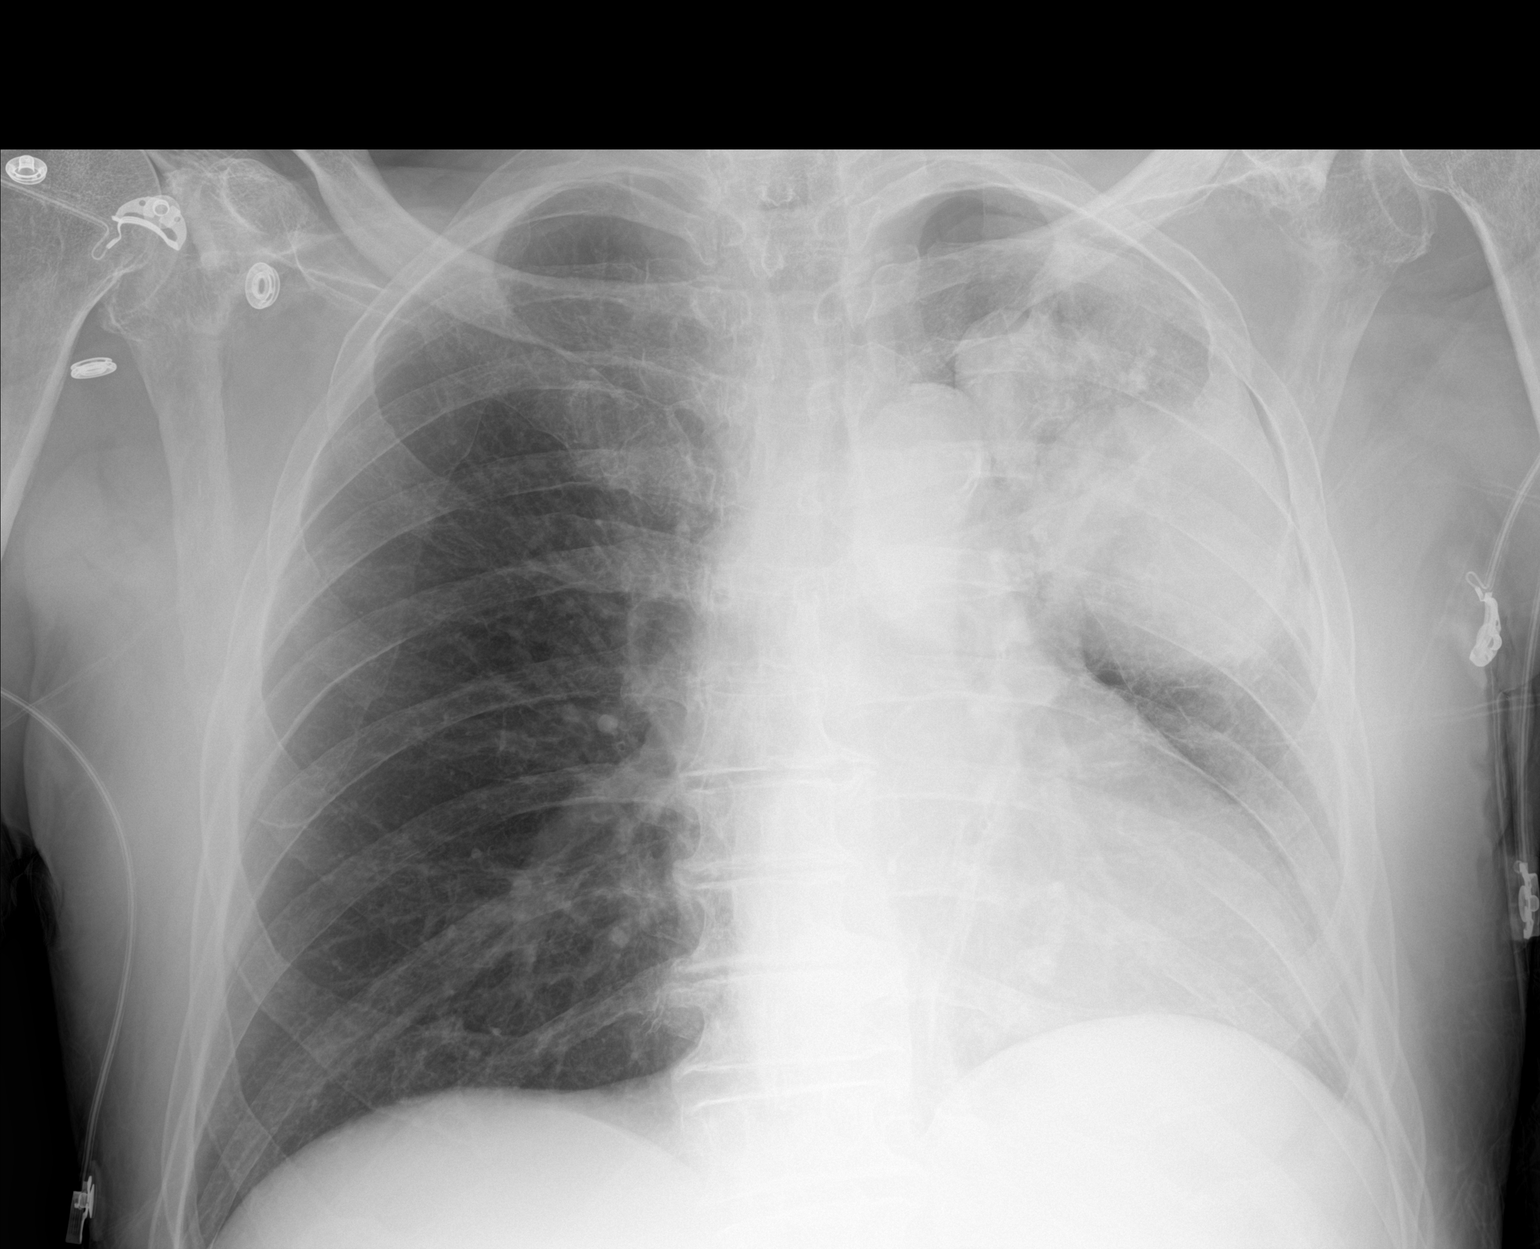

[1 of 1 positions shown; findings below may reference images not displayed]

FINDINGS: Left upper lung mass lesion noted. Right lung is clear. Cardiomegaly
with normal pulmonary vascularity.

Small left pneumothorax noted.
IMPRESSION: Small left pneumothorax noted. Left upper lobe mass lesion again
noted.

Critical Value/emergent results were called by telephone at the time
of interpretation on 06/12/2016 at [DATE] to Ericawho verbally
acknowledged these results.

## 2018-08-02 IMAGING — CR DG CHEST 2V
1 series · 2 of 2 positions shown · non-contrast
Comparison: 06/12/2016

CLINICAL DATA: Left upper lobe mass, status post bronchoscopy

EXAM:
CHEST  2 VIEW

[Series 1: dg chest 2 view · 0.14mm/px · 2 of 2 slices shown]
[im 1/2]
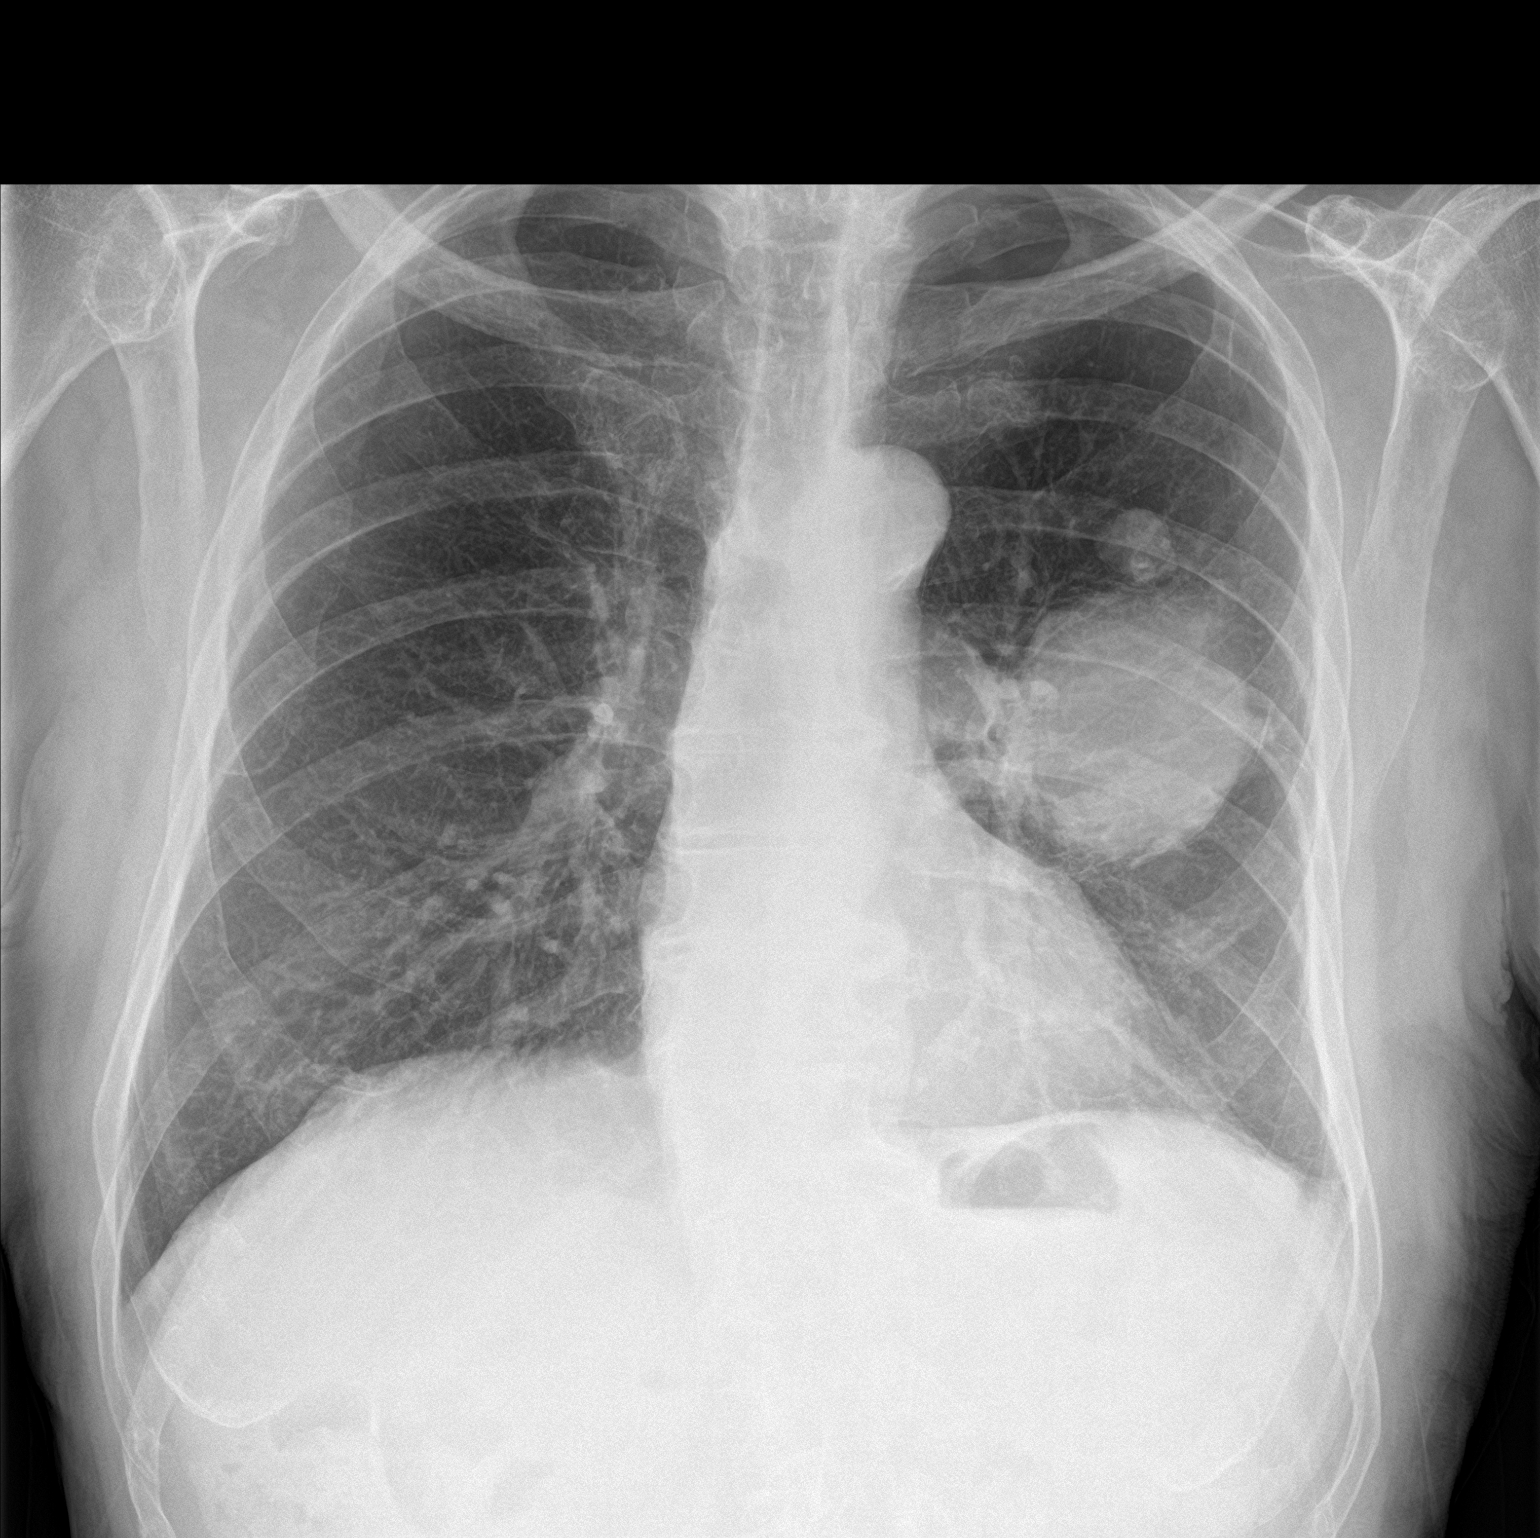
[im 2/2]
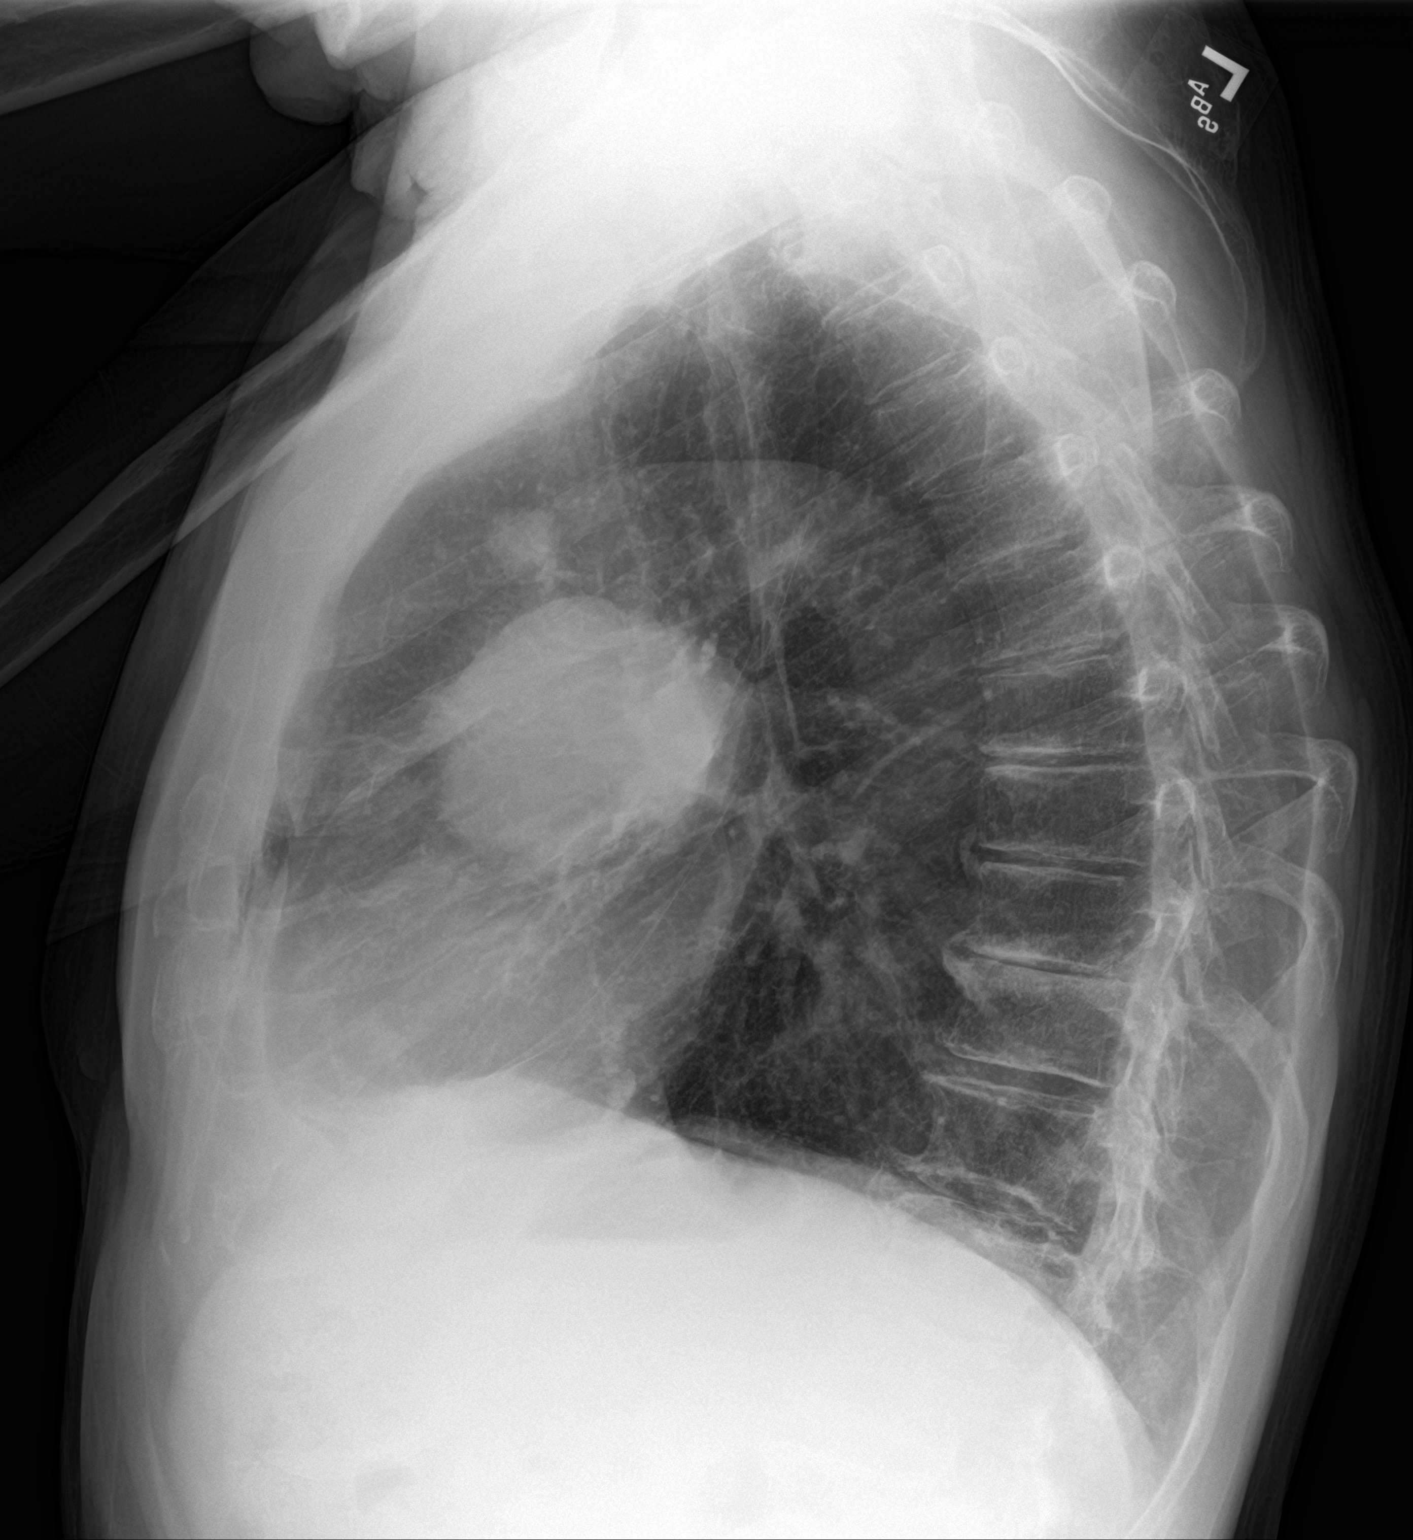

[2 of 2 positions shown; findings below may reference images not displayed]

FINDINGS: Cardiac shadow is within normal limits. Large left upper lobe mass
lesion with associated daughter lesion are again seen and stable.
The left lung again shows a small apical pneumothorax. The apical
component has increased slightly in the interval although the
lateral component has decreased. No bony abnormality is seen.
IMPRESSION: Relatively stable left lung pneumothorax.

Stable left upper lobe mass lesion and daughter lesion.
